# Patient Record
Sex: Male | Born: 1950
Health system: Southern US, Community
[De-identification: ages and names within clinical notes are randomized; demographics above are authoritative.]

## PROBLEM LIST (undated history)

## (undated) DIAGNOSIS — I639 Cerebral infarction, unspecified: Secondary | ICD-10-CM

## (undated) DIAGNOSIS — I2699 Other pulmonary embolism without acute cor pulmonale: Secondary | ICD-10-CM

## (undated) DIAGNOSIS — E7800 Pure hypercholesterolemia, unspecified: Secondary | ICD-10-CM

## (undated) DIAGNOSIS — N400 Enlarged prostate without lower urinary tract symptoms: Secondary | ICD-10-CM

## (undated) DIAGNOSIS — K56609 Unspecified intestinal obstruction, unspecified as to partial versus complete obstruction: Secondary | ICD-10-CM

## (undated) DIAGNOSIS — I5032 Chronic diastolic (congestive) heart failure: Secondary | ICD-10-CM

## (undated) HISTORY — PX: LIPOMA EXCISION: SHX5283

---

## 1982-08-08 DIAGNOSIS — I639 Cerebral infarction, unspecified: Secondary | ICD-10-CM

## 1982-08-08 HISTORY — DX: Cerebral infarction, unspecified: I63.9

## 1998-01-19 ENCOUNTER — Other Ambulatory Visit: Admission: RE | Admit: 1998-01-19 | Discharge: 1998-01-19 | Payer: Self-pay | Admitting: Family Medicine

## 2003-06-17 ENCOUNTER — Emergency Department (HOSPITAL_COMMUNITY): Admission: EM | Admit: 2003-06-17 | Discharge: 2003-06-17 | Payer: Self-pay | Admitting: Emergency Medicine

## 2003-07-02 ENCOUNTER — Inpatient Hospital Stay (HOSPITAL_COMMUNITY): Admission: EM | Admit: 2003-07-02 | Discharge: 2003-07-30 | Payer: Self-pay | Admitting: Emergency Medicine

## 2003-08-22 ENCOUNTER — Encounter: Admission: RE | Admit: 2003-08-22 | Discharge: 2003-08-22 | Payer: Self-pay | Admitting: Internal Medicine

## 2004-03-17 ENCOUNTER — Emergency Department (HOSPITAL_COMMUNITY): Admission: EM | Admit: 2004-03-17 | Discharge: 2004-03-17 | Payer: Self-pay | Admitting: Emergency Medicine

## 2004-03-19 ENCOUNTER — Emergency Department (HOSPITAL_COMMUNITY): Admission: EM | Admit: 2004-03-19 | Discharge: 2004-03-19 | Payer: Self-pay | Admitting: Family Medicine

## 2004-04-21 ENCOUNTER — Ambulatory Visit: Payer: Self-pay | Admitting: Family Medicine

## 2004-04-21 ENCOUNTER — Ambulatory Visit: Payer: Self-pay | Admitting: Internal Medicine

## 2005-03-29 ENCOUNTER — Ambulatory Visit: Payer: Self-pay | Admitting: Family Medicine

## 2005-11-04 ENCOUNTER — Ambulatory Visit: Payer: Self-pay | Admitting: Family Medicine

## 2005-11-08 ENCOUNTER — Ambulatory Visit: Payer: Self-pay | Admitting: Internal Medicine

## 2005-11-08 ENCOUNTER — Ambulatory Visit: Payer: Self-pay | Admitting: Family Medicine

## 2005-11-08 ENCOUNTER — Inpatient Hospital Stay (HOSPITAL_COMMUNITY): Admission: AD | Admit: 2005-11-08 | Discharge: 2005-11-15 | Payer: Self-pay | Admitting: Internal Medicine

## 2005-11-16 ENCOUNTER — Ambulatory Visit: Payer: Self-pay | Admitting: Family Medicine

## 2005-11-17 ENCOUNTER — Ambulatory Visit: Payer: Self-pay | Admitting: Family Medicine

## 2005-11-18 ENCOUNTER — Ambulatory Visit: Payer: Self-pay | Admitting: Family Medicine

## 2005-11-21 ENCOUNTER — Ambulatory Visit: Payer: Self-pay | Admitting: Family Medicine

## 2005-11-22 ENCOUNTER — Ambulatory Visit: Payer: Self-pay | Admitting: Family Medicine

## 2005-11-23 ENCOUNTER — Ambulatory Visit: Payer: Self-pay | Admitting: Family Medicine

## 2005-11-24 ENCOUNTER — Ambulatory Visit: Payer: Self-pay | Admitting: Family Medicine

## 2005-11-25 ENCOUNTER — Ambulatory Visit: Payer: Self-pay | Admitting: Family Medicine

## 2005-11-29 ENCOUNTER — Ambulatory Visit: Payer: Self-pay | Admitting: Family Medicine

## 2005-11-30 ENCOUNTER — Ambulatory Visit: Payer: Self-pay | Admitting: Family Medicine

## 2005-12-01 ENCOUNTER — Ambulatory Visit: Payer: Self-pay | Admitting: Family Medicine

## 2005-12-02 ENCOUNTER — Ambulatory Visit: Payer: Self-pay | Admitting: Family Medicine

## 2005-12-05 ENCOUNTER — Ambulatory Visit: Payer: Self-pay | Admitting: Family Medicine

## 2005-12-07 ENCOUNTER — Ambulatory Visit: Payer: Self-pay | Admitting: Family Medicine

## 2005-12-08 ENCOUNTER — Ambulatory Visit: Payer: Self-pay | Admitting: Family Medicine

## 2005-12-09 ENCOUNTER — Ambulatory Visit: Payer: Self-pay | Admitting: Family Medicine

## 2005-12-12 ENCOUNTER — Ambulatory Visit: Payer: Self-pay | Admitting: Family Medicine

## 2005-12-13 ENCOUNTER — Ambulatory Visit: Payer: Self-pay | Admitting: Family Medicine

## 2005-12-15 ENCOUNTER — Ambulatory Visit: Payer: Self-pay | Admitting: Family Medicine

## 2005-12-16 ENCOUNTER — Ambulatory Visit: Payer: Self-pay | Admitting: Family Medicine

## 2005-12-19 ENCOUNTER — Ambulatory Visit: Payer: Self-pay | Admitting: Family Medicine

## 2005-12-20 ENCOUNTER — Ambulatory Visit: Payer: Self-pay | Admitting: Family Medicine

## 2005-12-21 ENCOUNTER — Ambulatory Visit: Payer: Self-pay | Admitting: Family Medicine

## 2005-12-22 ENCOUNTER — Ambulatory Visit: Payer: Self-pay | Admitting: Family Medicine

## 2005-12-23 ENCOUNTER — Ambulatory Visit: Payer: Self-pay | Admitting: Family Medicine

## 2005-12-26 ENCOUNTER — Ambulatory Visit: Payer: Self-pay | Admitting: Family Medicine

## 2005-12-27 ENCOUNTER — Ambulatory Visit: Payer: Self-pay | Admitting: Family Medicine

## 2005-12-29 ENCOUNTER — Ambulatory Visit: Payer: Self-pay | Admitting: Family Medicine

## 2005-12-30 ENCOUNTER — Ambulatory Visit: Payer: Self-pay | Admitting: Family Medicine

## 2006-01-03 ENCOUNTER — Ambulatory Visit: Payer: Self-pay | Admitting: Family Medicine

## 2006-01-05 ENCOUNTER — Ambulatory Visit: Payer: Self-pay | Admitting: Family Medicine

## 2006-01-06 ENCOUNTER — Ambulatory Visit: Payer: Self-pay | Admitting: Family Medicine

## 2006-01-09 ENCOUNTER — Ambulatory Visit: Payer: Self-pay | Admitting: Family Medicine

## 2006-01-10 ENCOUNTER — Ambulatory Visit: Payer: Self-pay | Admitting: Family Medicine

## 2006-01-11 ENCOUNTER — Ambulatory Visit: Payer: Self-pay | Admitting: Family Medicine

## 2006-01-13 ENCOUNTER — Ambulatory Visit: Payer: Self-pay | Admitting: Family Medicine

## 2006-01-16 ENCOUNTER — Ambulatory Visit: Payer: Self-pay | Admitting: Family Medicine

## 2006-01-17 ENCOUNTER — Ambulatory Visit: Payer: Self-pay | Admitting: Family Medicine

## 2006-01-18 ENCOUNTER — Ambulatory Visit: Payer: Self-pay | Admitting: Family Medicine

## 2006-02-02 ENCOUNTER — Ambulatory Visit: Payer: Self-pay | Admitting: Family Medicine

## 2006-10-31 ENCOUNTER — Ambulatory Visit: Payer: Self-pay | Admitting: Family Medicine

## 2006-12-04 ENCOUNTER — Ambulatory Visit: Payer: Self-pay | Admitting: Family Medicine

## 2007-05-28 ENCOUNTER — Telehealth (INDEPENDENT_AMBULATORY_CARE_PROVIDER_SITE_OTHER): Payer: Self-pay | Admitting: *Deleted

## 2007-06-07 ENCOUNTER — Encounter (INDEPENDENT_AMBULATORY_CARE_PROVIDER_SITE_OTHER): Payer: Self-pay | Admitting: Family Medicine

## 2007-06-08 DIAGNOSIS — K649 Unspecified hemorrhoids: Secondary | ICD-10-CM | POA: Insufficient documentation

## 2007-06-08 DIAGNOSIS — E785 Hyperlipidemia, unspecified: Secondary | ICD-10-CM | POA: Insufficient documentation

## 2016-11-13 ENCOUNTER — Encounter (HOSPITAL_COMMUNITY): Payer: Self-pay | Admitting: Emergency Medicine

## 2016-11-13 ENCOUNTER — Emergency Department (HOSPITAL_COMMUNITY): Payer: Medicare HMO

## 2016-11-13 ENCOUNTER — Inpatient Hospital Stay (HOSPITAL_COMMUNITY): Payer: Medicare HMO

## 2016-11-13 ENCOUNTER — Inpatient Hospital Stay (HOSPITAL_COMMUNITY)
Admission: EM | Admit: 2016-11-13 | Discharge: 2016-11-18 | DRG: 176 | Disposition: A | Discharge status: Home / Self Care | Payer: Medicare HMO | Attending: Internal Medicine | Admitting: Internal Medicine

## 2016-11-13 DIAGNOSIS — I2699 Other pulmonary embolism without acute cor pulmonale: Secondary | ICD-10-CM

## 2016-11-13 DIAGNOSIS — E785 Hyperlipidemia, unspecified: Secondary | ICD-10-CM | POA: Diagnosis present

## 2016-11-13 DIAGNOSIS — I82431 Acute embolism and thrombosis of right popliteal vein: Secondary | ICD-10-CM | POA: Diagnosis present

## 2016-11-13 DIAGNOSIS — I69354 Hemiplegia and hemiparesis following cerebral infarction affecting left non-dominant side: Secondary | ICD-10-CM | POA: Diagnosis not present

## 2016-11-13 DIAGNOSIS — E7800 Pure hypercholesterolemia, unspecified: Secondary | ICD-10-CM | POA: Diagnosis present

## 2016-11-13 DIAGNOSIS — N400 Enlarged prostate without lower urinary tract symptoms: Secondary | ICD-10-CM | POA: Diagnosis present

## 2016-11-13 DIAGNOSIS — Z87891 Personal history of nicotine dependence: Secondary | ICD-10-CM | POA: Diagnosis not present

## 2016-11-13 DIAGNOSIS — I519 Heart disease, unspecified: Secondary | ICD-10-CM | POA: Diagnosis present

## 2016-11-13 DIAGNOSIS — E041 Nontoxic single thyroid nodule: Secondary | ICD-10-CM | POA: Diagnosis not present

## 2016-11-13 HISTORY — DX: Benign prostatic hyperplasia without lower urinary tract symptoms: N40.0

## 2016-11-13 HISTORY — DX: Pure hypercholesterolemia, unspecified: E78.00

## 2016-11-13 HISTORY — DX: Cerebral infarction, unspecified: I63.9

## 2016-11-13 LAB — CBC WITH DIFFERENTIAL/PLATELET
Basophils Absolute: 0 10*3/uL (ref 0.0–0.1)
Basophils Relative: 0 %
Eosinophils Absolute: 0.3 10*3/uL (ref 0.0–0.7)
Eosinophils Relative: 2 %
HCT: 49.7 % (ref 39.0–52.0)
Hemoglobin: 16.9 g/dL (ref 13.0–17.0)
Lymphocytes Relative: 16 %
Lymphs Abs: 1.8 10*3/uL (ref 0.7–4.0)
MCH: 30.2 pg (ref 26.0–34.0)
MCHC: 34 g/dL (ref 30.0–36.0)
MCV: 88.8 fL (ref 78.0–100.0)
Monocytes Absolute: 1 10*3/uL (ref 0.1–1.0)
Monocytes Relative: 9 %
Neutro Abs: 8.4 10*3/uL — ABNORMAL HIGH (ref 1.7–7.7)
Neutrophils Relative %: 73 %
Platelets: 188 10*3/uL (ref 150–400)
RBC: 5.6 MIL/uL (ref 4.22–5.81)
RDW: 13.5 % (ref 11.5–15.5)
WBC: 11.4 10*3/uL — ABNORMAL HIGH (ref 4.0–10.5)

## 2016-11-13 LAB — I-STAT CHEM 8, ED
BUN: 28 mg/dL — ABNORMAL HIGH (ref 6–20)
Calcium, Ion: 1.18 mmol/L (ref 1.15–1.40)
Chloride: 110 mmol/L (ref 101–111)
Creatinine, Ser: 0.9 mg/dL (ref 0.61–1.24)
Glucose, Bld: 125 mg/dL — ABNORMAL HIGH (ref 65–99)
HCT: 50 % (ref 39.0–52.0)
Hemoglobin: 17 g/dL (ref 13.0–17.0)
Potassium: 3.9 mmol/L (ref 3.5–5.1)
Sodium: 141 mmol/L (ref 135–145)
TCO2: 25 mmol/L (ref 0–100)

## 2016-11-13 LAB — BASIC METABOLIC PANEL
Anion gap: 11 (ref 5–15)
BUN: 24 mg/dL — AB (ref 6–20)
CHLORIDE: 106 mmol/L (ref 101–111)
CO2: 23 mmol/L (ref 22–32)
CREATININE: 0.91 mg/dL (ref 0.61–1.24)
Calcium: 9.4 mg/dL (ref 8.9–10.3)
GFR calc Af Amer: >60 mL/min (ref >60)
GFR calc non Af Amer: >60 mL/min (ref >60)
Glucose, Bld: 119 mg/dL — ABNORMAL HIGH (ref 65–99)
Potassium: 4.2 mmol/L (ref 3.5–5.1)
SODIUM: 140 mmol/L (ref 135–145)

## 2016-11-13 LAB — TROPONIN I
TROPONIN I: 0.44 ng/mL — AB (ref <0.03)
Troponin I: 0.74 ng/mL (ref <0.03)
Troponin I: 0.5 ng/mL (ref <0.03)
Troponin I: 0.44 ng/mL (ref <0.03)

## 2016-11-13 LAB — I-STAT TROPONIN, ED: Troponin i, poc: 1.07 ng/mL (ref 0.00–0.08)

## 2016-11-13 LAB — RAPID URINE DRUG SCREEN, HOSP PERFORMED
Amphetamines: NOT DETECTED
BENZODIAZEPINES: NOT DETECTED
Barbiturates: NOT DETECTED
COCAINE: NOT DETECTED
OPIATES: NOT DETECTED
Tetrahydrocannabinol: POSITIVE — AB

## 2016-11-13 LAB — HEPARIN LEVEL (UNFRACTIONATED)
Heparin Unfractionated: 1.1 IU/mL — ABNORMAL HIGH (ref 0.30–0.70)
Heparin Unfractionated: 0.45 IU/mL (ref 0.30–0.70)

## 2016-11-13 LAB — T4, FREE: Free T4: 0.97 ng/dL (ref 0.61–1.12)

## 2016-11-13 LAB — BRAIN NATRIURETIC PEPTIDE: B Natriuretic Peptide: 42.7 pg/mL (ref 0.0–100.0)

## 2016-11-13 LAB — MRSA PCR SCREENING: MRSA BY PCR: NEGATIVE

## 2016-11-13 LAB — TSH: TSH: 0.954 u[IU]/mL (ref 0.350–4.500)

## 2016-11-13 MED ORDER — ACETAMINOPHEN 325 MG PO TABS: 650.0000 mg | ORAL_TABLET | Freq: Four times a day (QID) | ORAL | Status: DC | PRN

## 2016-11-13 MED ORDER — TAMSULOSIN HCL 0.4 MG PO CAPS
0.4000 mg | ORAL_CAPSULE | Freq: Every day | ORAL | Status: DC
  Administered 2016-11-13 – 2016-11-18 (×6): 0.4 mg via ORAL
  Filled 2016-11-13 (×6): qty 1

## 2016-11-13 MED ORDER — HEPARIN (PORCINE) IN NACL 100-0.45 UNIT/ML-% IJ SOLN
1500.0000 [IU]/h | INTRAMUSCULAR | Status: DC
  Administered 2016-11-13: 1500 [IU]/h via INTRAVENOUS
  Filled 2016-11-13: qty 250

## 2016-11-13 MED ORDER — IOPAMIDOL (ISOVUE-370) INJECTION 76%
INTRAVENOUS | Status: AC
  Administered 2016-11-13: 100 mL
  Filled 2016-11-13: qty 100

## 2016-11-13 MED ORDER — ZOLPIDEM TARTRATE 5 MG PO TABS: 5.0000 mg | ORAL_TABLET | Freq: Every evening | ORAL | Status: DC | PRN

## 2016-11-13 MED ORDER — ACETAMINOPHEN 650 MG RE SUPP: 650.0000 mg | Freq: Four times a day (QID) | RECTAL | Status: DC | PRN

## 2016-11-13 MED ORDER — HEPARIN (PORCINE) IN NACL 100-0.45 UNIT/ML-% IJ SOLN
1400.0000 [IU]/h | INTRAMUSCULAR | Status: DC
  Administered 2016-11-13: 1250 [IU]/h via INTRAVENOUS
  Administered 2016-11-14 – 2016-11-17 (×3): 1350 [IU]/h via INTRAVENOUS
  Filled 2016-11-13 (×6): qty 250

## 2016-11-13 MED ORDER — SODIUM CHLORIDE 0.9% FLUSH
3.0000 mL | Freq: Two times a day (BID) | INTRAVENOUS | Status: DC
  Administered 2016-11-13 – 2016-11-18 (×6): 3 mL via INTRAVENOUS

## 2016-11-13 MED ORDER — IPRATROPIUM BROMIDE 0.02 % IN SOLN
0.5000 mg | Freq: Once | RESPIRATORY_TRACT | Status: AC
  Administered 2016-11-13: 0.5 mg via RESPIRATORY_TRACT
  Filled 2016-11-13: qty 2.5

## 2016-11-13 MED ORDER — ALBUTEROL SULFATE (2.5 MG/3ML) 0.083% IN NEBU
5.0000 mg | INHALATION_SOLUTION | Freq: Once | RESPIRATORY_TRACT | Status: AC
  Administered 2016-11-13: 5 mg via RESPIRATORY_TRACT
  Filled 2016-11-13: qty 6

## 2016-11-13 MED ORDER — ONDANSETRON HCL 4 MG/2ML IJ SOLN: 4.0000 mg | Freq: Three times a day (TID) | INTRAMUSCULAR | Status: DC | PRN

## 2016-11-13 MED ORDER — SENNOSIDES-DOCUSATE SODIUM 8.6-50 MG PO TABS
1.0000 | ORAL_TABLET | Freq: Every evening | ORAL | Status: DC | PRN
  Filled 2016-11-13: qty 1

## 2016-11-13 MED ORDER — OXYCODONE-ACETAMINOPHEN 5-325 MG PO TABS: 1.0000 | ORAL_TABLET | ORAL | Status: DC | PRN

## 2016-11-13 MED ORDER — ALBUTEROL SULFATE (2.5 MG/3ML) 0.083% IN NEBU: 5.0000 mg | INHALATION_SOLUTION | RESPIRATORY_TRACT | Status: DC | PRN

## 2016-11-13 MED ORDER — HEPARIN BOLUS VIA INFUSION
6000.0000 [IU] | Freq: Once | INTRAVENOUS | Status: AC
  Administered 2016-11-13: 6000 [IU] via INTRAVENOUS
  Filled 2016-11-13: qty 6000

## 2016-11-13 MED ORDER — ROSUVASTATIN CALCIUM 10 MG PO TABS
10.0000 mg | ORAL_TABLET | Freq: Every day | ORAL | Status: DC
  Administered 2016-11-17: 10 mg via ORAL
  Filled 2016-11-13 (×2): qty 1

## 2016-11-13 MED ORDER — SODIUM CHLORIDE 0.9 % IV SOLN
INTRAVENOUS | Status: DC
  Administered 2016-11-13 (×2): via INTRAVENOUS
  Administered 2016-11-14: 75 mL/h via INTRAVENOUS

## 2016-11-13 NOTE — Progress Notes (Signed)
Patient seen and examined by me. Patient was admitted by Dr.Niu for acute shortness of breath and was found to have PE with right heart strain. He was started on heparin drip and echocardiogram/lower extremity Dopplers ordered. Pulmonary was consulted as well. At this time patient is doing well and states his breathing has improved since his admission and does not agree new complaints at this time.  Physical exam remains unchanged from the time of his admission  Pulmonary embolism with right-sided heart strain Solid/cystic right thyroid nodule Hyperlipidemia BPH  At this time continue his heparin drip Echocardiogram and lower summary Dopplers of in order Pain control Nebulizer treatments as needed Follow-up thyroid ultrasound Continue Crestor and Flomax Pulmonary following  I have discussed with them Coumadin versus noval anticoagulation agents and he would like to speak with case management once the decision is made in terms of cost and coverage.

## 2016-11-13 NOTE — Consult Note (Signed)
PULMONARY / CRITICAL CARE MEDICINE   Name: Jason Myers MRN: [id number] DOB: 06-15-1951    ADMISSION DATE:  11/13/2016 CONSULTATION DATE:  .11/13/16  REFERRING MD:  Dr. Nicanor Alcon  CHIEF COMPLAINT:  Pulmonary embolism  HISTORY OF PRESENT ILLNESS:   Jason Myers is a 66 y.o. 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 He was in his usual state of health until this evening when he woke up abruptly with an unsettling feeling with his breathing from sleep. He tried to ignore the symptoms for about an hour before calling EMS to bring him in. He at baseline does not walk much due to his neurologic deficits. Here in the ED, he was found to have a saddle pulmonary embolism with evidence of right heart strain by RV:LV 1.2 and EKG pattern. In addition his troponin is 1. PCCM was consulted as part of a CODE PE.   Aside from his CVA, he only has the diagnoses of high cholesterol and BPH. He had a lipoma extracted years ago but no other surgeries or recent trauma. He has not noticed any symptoms of chest pain, shortness of breath, presyncope or syncope or changes in his legs.   PAST MEDICAL HISTORY :  He  has a past medical history of BPH (benign prostatic hyperplasia); CVA (cerebral vascular accident) (HCC) (1984); and Hypercholesterolemia.  PAST SURGICAL HISTORY: He  has a past surgical history that includes Lipoma excision (Left).  No Known Allergies  No current facility-administered medications on file prior to encounter.    No current outpatient prescriptions on file prior to encounter.    FAMILY HISTORY:  His indicated that the status of his mother is unknown. He indicated that the status of his father is unknown.    SOCIAL HISTORY: He  reports that he has quit smoking. He has never used smokeless tobacco. He reports that he drinks alcohol. He reports that he uses drugs, including  Marijuana.  REVIEW OF SYSTEMS:   Complete ROS otherwise negative.  VITAL SIGNS: BP (!) 113/96   Pulse (!) 109   Resp 17   Ht  (1.854 m)   Wt 88.5 kg (195 lb)   SpO2 91%   BMI 25.73 kg/m   INTAKE / OUTPUT: No intake/output data recorded.  PHYSICAL EXAMINATION: General:  Alert, oriented, no acute distress Neuro:  Left extremities in contractures, able to bend left leg at the knee but otherwise weak, no facial asymetry HEENT:  MMM, clear oropharynx Cardiovascular:  Tachycardic, no murmurs rubs or gallops Lungs:  CTAB Abdomen:  Soft NT ND no rebound or gaurding Musculoskeletal:  Left hand and food in flexed contracture Skin:  No rashes  LABS:  BMET  Recent Labs Lab 11/13/16 0214  NA 141  K 3.9  CL 110  BUN 28*  CREATININE 0.90  GLUCOSE 125*    Electrolytes No results for input(s): CALCIUM, MG, PHOS in the last 168 hours.  CBC  Recent Labs Lab 11/13/16 0153 11/13/16 0214  WBC 11.4*  --   HGB 16.9 17.0  HCT 49.7 50.0  PLT 188  --     Coag's No results for input(s): APTT, INR in the last 168 hours.  Sepsis Markers No results for input(s): LATICACIDVEN, PROCALCITON, O2SATVEN in the last 168 hours.  ABG No results for input(s): PHART, PCO2ART, PO2ART in the last 168 hours.  Liver Enzymes No results for input(s):  AST, ALT, ALKPHOS, BILITOT, ALBUMIN in the last 168 hours.  Cardiac Enzymes No results for input(s): TROPONINI, PROBNP in the last 168 hours.  Glucose No results for input(s): GLUCAP in the last 168 hours.  Imaging Dg Chest 2 View  Result Date: 11/13/2016 CLINICAL DATA:  Dyspnea for couple hours tonight. EXAM: CHEST  2 VIEW COMPARISON:  None. FINDINGS: The lungs are clear. The pulmonary vasculature is normal. Heart size is normal. Hilar and mediastinal contours are unremarkable. There is no pleural effusion. IMPRESSION: No active cardiopulmonary disease. Electronically Signed   By: Ellery Plunk M.D.   On: 11/13/2016 02:03   Ct  Angio Chest Pe W And/or Wo Contrast  Result Date: 11/13/2016 CLINICAL DATA:  Dyspnea starting this evening without chest pain EXAM: CT ANGIOGRAPHY CHEST WITH CONTRAST TECHNIQUE: Multidetector CT imaging of the chest was performed using the standard protocol during bolus administration of intravenous contrast. Multiplanar CT image reconstructions and MIPs were obtained to evaluate the vascular anatomy. CONTRAST:  100 cc Isovue-300 IV COMPARISON:  None. FINDINGS: Cardiovascular: Acute multilobar pulmonary emboli within the distal right and left pulmonary arteries extending into the lobar, segmental and subsegmental branches bilaterally. No pulmonary infarction. No effusion or pneumothorax. RV/LV ratio is 1.2 consistent with right heart strain. No aortic aneurysm or dissection. Coronary arteriosclerosis is noted. No pericardial effusion. Heart is top-normal in size. Mediastinum/Nodes: No enlarged mediastinal, hilar, or axillary lymph nodes. The trachea and esophagus demonstrate no significant findings. There is a dominant, partially solid and partially cystic right thyroid lobe nodule measuring 3.1 x 3.5 cm for which ultrasound is recommended. Lungs/Pleura: Bibasilar dependent atelectasis. No pulmonary infarcts. No pneumonic consolidation, effusion or pneumothorax. Upper Abdomen: No acute abnormality. Musculoskeletal: No chest wall abnormality. No acute or significant osseous findings. Review of the MIP images confirms the above findings. IMPRESSION: 1. Positive for acute PE with CT evidence of right heart strain (RV/LV Ratio = 1.2) consistent with at least submassive (intermediate risk) PE. The presence of right heart strain has been associated with an increased risk of morbidity and mortality. Please activate Code PE by paging [phone]. Critical Value/emergent results were called by telephone at the time of interpretation on 11/13/2016 at 3:37 am to Dr. Cy Blamer , who verbally acknowledged these results. 2.  3.1 x 3.5 cm partially solid, partially cystic right thyroid nodule. Ultrasound is recommended. This follows ACR consensus guidelines: Managing Incidental Thyroid Nodules Detected on Imaging: White Paper of the ACR Incidental Thyroid Findings Committee. J Am Coll Radiol 2015; 12:143-150. Electronically Signed   By: Tollie Eth M.D.   On: 11/13/2016 03:38     DISCUSSION: Jason Myers is a 66 y.o. 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 Due to his previous history of aneurysmal rupture, he is not a candidate for systemic thrombolysis nor catheter directed tPA. Catheter embolectomy is a consideration but no clear guidelines exist to guide it as standard of care in this setting. If he were to become hypotensive, more invasive measures may be needed; and the patient is aware of the risks and his tenuous status.  ASSESSMENT / PLAN: Recommend the following - Echocardiogram now - Start therapeutic heparin infusion - Cycle troponins q6h - Lower extremity DVT doppler studies -  Consider temporary IVC filter if DVT found in legs - Admit to Stepdown unit with telemetry   Cornell Barman MD Pulmonary and Critical Care Medicine [facility] Pager: [phone]  11/13/2016, 4:48 AM

## 2016-11-13 NOTE — ED Notes (Signed)
Patient transported to CT 

## 2016-11-13 NOTE — Progress Notes (Signed)
VASCULAR LAB PRELIMINARY  PRELIMINARY  PRELIMINARY  PRELIMINARY  Bilateral lower extremity venous duplex completed.    Preliminary report:  There is subacute DVT noted in the right popliteal vein.    Called results to Campbell, RN  [facility], Estiben Mizuno, RVT 11/13/2016, 11:57 AM

## 2016-11-13 NOTE — ED Notes (Signed)
Dr. Clyde Lundborg notified of critical troponin of 0.74.

## 2016-11-13 NOTE — ED Notes (Signed)
Attempted report 

## 2016-11-13 NOTE — Progress Notes (Signed)
ANTICOAGULATION CONSULT NOTE  Pharmacy Consult for heparin Indication: pulmonary embolus  No Known Allergies  Patient Measurements: Height:  (185.4 cm) Weight: 195 lb (88.5 kg) IBW/kg (Calculated) : 79.9  Vital Signs: Temp: 98.1 F (36.7 C) (04/08 2100) Temp Source: Oral (04/08 2100)  Labs:  Recent Labs  11/13/16 0153 11/13/16 0214 11/13/16 0437 11/13/16 0857 11/13/16 0902 11/13/16 1319 11/13/16 1950  HGB 16.9 17.0  --   --   --   --   --   HCT 49.7 50.0  --   --   --   --   --   PLT 188  --   --   --   --   --   --   HEPARINUNFRC  --   --   --   --  1.10*  --  0.45  CREATININE  --  0.90 0.91  --   --   --   --   TROPONINI  --   --  0.74* 0.50*  --  0.44*  --     Estimated Creatinine Clearance: 89 mL/min (by C-G formula based on SCr of 0.91 mg/dL).  Assessment: 66yo male c/o SOB onset earlier in evening, when pt woke up from chair SOB had worsened, CT shows PE w/ evidence of RHS, RV/LV ratio 1.2. Not candidate for thrombolysis 2/2 stroke history. First level elevated at 1.1, now dropped to 0.45 units/mL.  No bleeding noted.  Goal of Therapy:  Heparin level 0.3-0.7 units/ml Monitor platelets by anticoagulation protocol: Yes   Plan:  Increase heparin 1350 units/hr as concerned with significant drop in heparin level Recheck level with daily labs Daily heparin level and CBC Monitor for s/s bleeding.   Evaan Tidwell D. Doyle Tegethoff, PharmD, BCPS Clinical Pharmacist Pager: [phone] 11/13/2016 10:09 PM

## 2016-11-13 NOTE — ED Notes (Signed)
O2 sensor placed on ear.  Sat noted to be 94% on room air.  Getting breathing treatment at this time.

## 2016-11-13 NOTE — Plan of Care (Signed)
Problem: Health Behavior/Discharge Planning: Goal: Ability to manage health-related needs will improve Outcome: Progressing Case Management and SW consults requested.

## 2016-11-13 NOTE — Progress Notes (Signed)
ANTICOAGULATION CONSULT NOTE - Initial Consult  Pharmacy Consult for heparin Indication: pulmonary embolus  No Known Allergies  Patient Measurements: Height:  (185.4 cm) Weight: 195 lb (88.5 kg) IBW/kg (Calculated) : 79.9  Vital Signs: BP: 122/91 (04/08 0745) Pulse Rate: 103 (04/08 0745)  Labs:  Recent Labs  11/13/16 0153 11/13/16 0214 11/13/16 0437 11/13/16 0857 11/13/16 0902  HGB 16.9 17.0  --   --   --   HCT 49.7 50.0  --   --   --   PLT 188  --   --   --   --   HEPARINUNFRC  --   --   --   --  1.10*  CREATININE  --  0.90 0.91  --   --   TROPONINI  --   --  0.74* 0.50*  --     Estimated Creatinine Clearance: 89 mL/min (by C-G formula based on SCr of 0.91 mg/dL).   Medical History: Past Medical History:  Diagnosis Date  . BPH (benign prostatic hyperplasia)   . CVA (cerebral vascular accident) (HCC) 1984   left side affected.    . Hypercholesterolemia     Assessment: 66yo male c/o SOB onset earlier in evening, when pt woke up from chair SOB had worsened, CT shows PE w/ evidence of RHS, RV/LV ratio 1.2. Not candidate for thrombolysis 2/2 stroke history. Heparin level supratherapeutic at 1.1. Per RN labs drawn in opposite arm that heparin was infusing. CBC stable, no overt s/s bleeding.   Goal of Therapy:  Heparin level 0.3-0.7 units/ml Monitor platelets by anticoagulation protocol: Yes   Plan:  Hold heparin gtt x1 hr Resume heparin gtt at reduced rate: 1250 units/hr Heparin level in 6 hours Daily heparin level and CBC Monitor for s/s bleeding.   York Cerise, PharmD Pharmacy Resident  Pager [phone] 11/13/16 11:27 AM

## 2016-11-13 NOTE — ED Triage Notes (Signed)
Brought by ems from home.  Reports having some SOB earlier in night but fell asleep in chair.  When he woke up to go to bed had worse SOB.  No history of.  Breathing easy and non-labored.  Sats in low 80's on RA.  Placed on 3L [place] sat up to 88-90%.

## 2016-11-13 NOTE — H&P (Signed)
History and Physical    Jason Myers ZOX:[id number] DOB: 1951-05-04 DOA: 11/13/2016  Referring MD/NP/PA:   PCP: [facility]   Patient coming from:  The patient is coming from home.  At baseline, pt is independent for most of ADL.   Chief Complaint: Shortness of breath  HPI: Jason Myers is a 66 y.o. male with medical history significant of hemorrhagic stroke, hyperlipidemia, BPH, who presents with shortness breath.  Patient states that he started having shortness of breath since last night, but denies chest pain, cough, fever or chills. No recent long distant traveling history. No tenderness in calf areas. Patient was found to have O2 desaturating to 80% on room air, which improved to 88-90% on 3 L oxygen. Patient denies nausea, vomiting, diarrhea, abdominal pain, symptoms of UTI or unilateral weakness. Patient states that he drinks alcohol approximately 3 times per month. Last drinking was  on Monday.  ED Course: pt was found to have WBC 11.4, BNP 42.7, electrolytes renal function okay, negative chest x-ray, tachycardia. CT angiogram of chest showed submassive PE with evidence of right heart straining, also showed a 3.1 x 3.5 cm partially solid, partially cystic right thyroid nodule.  Pt is admitted to [facility] as inpt. PCCM was consulted (EDP does not remember doctor's name).   Review of Systems:   General: no fevers, chills, no changes in body weight, has fatigue HEENT: no blurry vision, hearing changes or sore throat Respiratory: has dyspnea, no coughing, wheezing CV: no chest pain, no palpitations GI: no nausea, vomiting, abdominal pain, diarrhea, constipation GU: no dysuria, burning on urination, increased urinary frequency, hematuria  Ext: no leg edema Neuro: no unilateral weakness, numbness, or tingling, no vision change or hearing loss Skin: no rash, no skin tear. MSK: No muscle spasm, no deformity, no limitation of range of movement in spin Heme: No easy bruising.  Travel  history: No recent long distant travel.  Allergy: No Known Allergies  Past Medical History:  Diagnosis Date  . BPH (benign prostatic hyperplasia)   . CVA (cerebral vascular accident) (HCC) 1984   left side affected.    . Hypercholesterolemia     Past Surgical History:  Procedure Laterality Date  . LIPOMA EXCISION Left    groin    Social History:  reports that he has quit smoking. He has never used smokeless tobacco. He reports that he drinks alcohol. He reports that he uses drugs, including Marijuana.  Family History:  Family History  Problem Relation Age of Onset  . Alcoholism Mother   . Kidney Stones Father      Prior to Admission medications   Medication Sig Start Date End Date Taking? Authorizing Provider  rosuvastatin (CRESTOR) 10 MG tablet Take 10 mg by mouth daily. 09/28/16  Yes Historical Provider, MD  tamsulosin (FLOMAX) 0.4 MG CAPS capsule Take 0.4 mg by mouth daily. 09/28/16  Yes Historical Provider, MD    Physical Exam: Vitals:   11/13/16 0245 11/13/16 0330 11/13/16 0336 11/13/16 0345  BP: 127/90 109/90  (!) 113/96  Pulse: (!) 115  (!) 106 (!) 109  Resp: 13  (!) 22 17  SpO2: 94%  94% 91%  Weight:      Height:       General: Not in acute distress HEENT:       Eyes: PERRL, EOMI, no scleral icterus.       ENT: No discharge from the ears and nose, no pharynx injection, no tonsillar enlargement.  Neck: No JVD, no bruit, no mass felt. Heme: No neck lymph node enlargement. Cardiac: S1/S2, RRR,Tachycardia, No murmurs, No gallops or rubs. Respiratory: No rales, wheezing, rhonchi or rubs. GI: Soft, nondistended, nontender, no rebound pain, no organomegaly, BS present. GU: No hematuria Ext: No pitting leg edema bilaterally. 2+DP/PT pulse bilaterally. Musculoskeletal: No joint deformities, No joint redness or warmth, no limitation of ROM in spin. Skin: No rashes.  Neuro: Alert, oriented X3, cranial nerves II-XII grossly intact, moves all extremities  normally. Psych: Patient is not psychotic, no suicidal or hemocidal ideation.  Labs on Admission: I have personally reviewed following labs and imaging studies  CBC:  Recent Labs Lab 11/13/16 0153 11/13/16 0214  WBC 11.4*  --   NEUTROABS 8.4*  --   HGB 16.9 17.0  HCT 49.7 50.0  MCV 88.8  --   PLT 188  --    Basic Metabolic Panel:  Recent Labs Lab 11/13/16 0214  NA 141  K 3.9  CL 110  GLUCOSE 125*  BUN 28*  CREATININE 0.90   GFR: Estimated Creatinine Clearance: 90 mL/min (by C-G formula based on SCr of 0.9 mg/dL). Liver Function Tests: No results for input(s): AST, ALT, ALKPHOS, BILITOT, PROT, ALBUMIN in the last 168 hours. No results for input(s): LIPASE, AMYLASE in the last 168 hours. No results for input(s): AMMONIA in the last 168 hours. Coagulation Profile: No results for input(s): INR, PROTIME in the last 168 hours. Cardiac Enzymes: No results for input(s): CKTOTAL, CKMB, CKMBINDEX, TROPONINI in the last 168 hours. BNP (last 3 results) No results for input(s): PROBNP in the last 8760 hours. HbA1C: No results for input(s): HGBA1C in the last 72 hours. CBG: No results for input(s): GLUCAP in the last 168 hours. Lipid Profile: No results for input(s): CHOL, HDL, LDLCALC, TRIG, CHOLHDL, LDLDIRECT in the last 72 hours. Thyroid Function Tests: No results for input(s): TSH, T4TOTAL, FREET4, T3FREE, THYROIDAB in the last 72 hours. Anemia Panel: No results for input(s): VITAMINB12, FOLATE, FERRITIN, TIBC, IRON, RETICCTPCT in the last 72 hours. Urine analysis: No results found for: COLORURINE, APPEARANCEUR, LABSPEC, PHURINE, GLUCOSEU, HGBUR, BILIRUBINUR, KETONESUR, PROTEINUR, UROBILINOGEN, NITRITE, LEUKOCYTESUR Sepsis Labs: (procalcitonin:4,lacticidven:4) )No results found for this or any previous visit (from the past 240 hour(s)).   Radiological Exams on Admission: Dg Chest 2 View  Result Date: 11/13/2016 CLINICAL DATA:  Dyspnea for couple hours  tonight. EXAM: CHEST  2 VIEW COMPARISON:  None. FINDINGS: The lungs are clear. The pulmonary vasculature is normal. Heart size is normal. Hilar and mediastinal contours are unremarkable. There is no pleural effusion. IMPRESSION: No active cardiopulmonary disease. Electronically Signed   By: Ellery Plunk M.D.   On: 11/13/2016 02:03   Ct Angio Chest Pe W And/or Wo Contrast  Result Date: 11/13/2016 CLINICAL DATA:  Dyspnea starting this evening without chest pain EXAM: CT ANGIOGRAPHY CHEST WITH CONTRAST TECHNIQUE: Multidetector CT imaging of the chest was performed using the standard protocol during bolus administration of intravenous contrast. Multiplanar CT image reconstructions and MIPs were obtained to evaluate the vascular anatomy. CONTRAST:  100 cc Isovue-300 IV COMPARISON:  None. FINDINGS: Cardiovascular: Acute multilobar pulmonary emboli within the distal right and left pulmonary arteries extending into the lobar, segmental and subsegmental branches bilaterally. No pulmonary infarction. No effusion or pneumothorax. RV/LV ratio is 1.2 consistent with right heart strain. No aortic aneurysm or dissection. Coronary arteriosclerosis is noted. No pericardial effusion. Heart is top-normal in size. Mediastinum/Nodes: No enlarged mediastinal, hilar, or axillary lymph nodes. The trachea and  esophagus demonstrate no significant findings. There is a dominant, partially solid and partially cystic right thyroid lobe nodule measuring 3.1 x 3.5 cm for which ultrasound is recommended. Lungs/Pleura: Bibasilar dependent atelectasis. No pulmonary infarcts. No pneumonic consolidation, effusion or pneumothorax. Upper Abdomen: No acute abnormality. Musculoskeletal: No chest wall abnormality. No acute or significant osseous findings. Review of the MIP images confirms the above findings. IMPRESSION: 1. Positive for acute PE with CT evidence of right heart strain (RV/LV Ratio = 1.2) consistent with at least submassive  (intermediate risk) PE. The presence of right heart strain has been associated with an increased risk of morbidity and mortality. Please activate Code PE by paging [phone]. Critical Value/emergent results were called by telephone at the time of interpretation on 11/13/2016 at 3:37 am to Dr. Cy Blamer , who verbally acknowledged these results. 2. 3.1 x 3.5 cm partially solid, partially cystic right thyroid nodule. Ultrasound is recommended. This follows ACR consensus guidelines: Managing Incidental Thyroid Nodules Detected on Imaging: White Paper of the ACR Incidental Thyroid Findings Committee. J Am Coll Radiol 2015; 12:143-150. Electronically Signed   By: Tollie Eth M.D.   On: 11/13/2016 03:38     EKG: Independently reviewed. Sinus rhythm, tachycardia, QTC 445, early R-wave progression   Assessment/Plan Principal Problem:   PE (pulmonary thromboembolism) (HCC) Active Problems:   HLD (hyperlipidemia)   BPH (benign prostatic hyperplasia)   Thyroid nodule   PE (pulmonary thromboembolism) (HCC): CT angiogram of chest showed submassive PE with evidence of right heart straining. Currently hemodynamically stable. BNP 42.7. Patient does not have chest pain. No triggering factors identified. Patient has no recent long distant traveling or surgery. PCCM was consulted (EDP does not remember doctor's name).  -will admit to SDU as inpt -heparin drip initiated -2D echocardiogram ordered -LE dopplers ordered to evaluate for DVT -When necessary Percocet if develops chest pain -prn albuterol nebs -trop x 3  Thyroid nodule: Incidental findings. 3.1 x 3.5 cm partially solid, partially cystic right thyroid nodule.   -get US-thyroid -check TSH, Free T4 and T3  HLD (hyperlipidemia): -crestor  BPH (benign prostatic hyperplasia): -continue flomax.   DVT ppx: IV Heparin   Code Status: Full code Family Communication: None at bed side.  Disposition Plan:  Anticipate discharge back to previous  home environment Consults called: PCCM was consulted (EDP does not remember doctor's name). Admission status:  SDU/inpation       Date of Service 11/13/2016    Lorretta Harp [facility] Pager [phone]  If 7PM-7AM, please contact night-coverage [email or web address] Password [facility] 11/13/2016, 4:48 AM

## 2016-11-13 NOTE — ED Provider Notes (Signed)
[facility]-EMERGENCY DEPT Provider Note   CSN: [id number] Arrival date & time: 11/13/16  0117  By signing my name below, I, Elder Negus, attest that this documentation has been prepared under the direction and in the presence of Juleon Narang, MD. Electronically Signed: Elder Negus, Scribe. 11/13/16. 2:06 AM.   History   Chief Complaint Chief Complaint  Patient presents with  . Shortness of Breath    HPI Jason Myers is a 66 y.o. male with history of prior CVA without deficits who presents to the ED for evaluation of dyspnea. This patient has no home oxygen requirement or respiratory history. He was watching television when he had sudden onset of dyspnea and called EMS. With them, he is in the low 80's on room air. Started on 3L nasal cannula by EMS with highest oxygen saturation at 88 percent. At interview, he denies any chest pain. He denies any leg swelling, cough, or wheezing. He has no other complaints than dyspnea. No fever.  The history is provided by the patient. No language interpreter was used.  Shortness of Breath  This is a new problem. The problem occurs continuously.The current episode started 1 to 2 hours ago. Pertinent negatives include no fever, no sore throat, no ear pain, no cough, no wheezing, no chest pain, no vomiting, no abdominal pain, no rash and no leg swelling. It is unknown what precipitated the problem. Risk factors: non walking secondary to stroke. He has tried nothing for the symptoms. The treatment provided no relief. He has had no prior ICU admissions. Associated medical issues do not include CAD.    Past Medical History:  Diagnosis Date  . BPH (benign prostatic hyperplasia)   . CVA (cerebral vascular accident) (HCC) 1984   left side affected.    . Hypercholesterolemia     Patient Active Problem List   Diagnosis Date Noted  . HYPERLIPIDEMIA 06/08/2007  . HEMORRHOIDS 06/08/2007    Past Surgical History:  Procedure Laterality Date  . LIPOMA  EXCISION Left    groin       Home Medications    Prior to Admission medications   Not on File    Family History No family history on file.  Social History Social History  Substance Use Topics  . Smoking status: Former Games developer  . Smokeless tobacco: Never Used  . Alcohol use Yes     Comment: occasionally     Allergies   Patient has no allergy information on record.   Review of Systems Review of Systems  Constitutional: Negative for chills and fever.  HENT: Negative for ear pain and sore throat.   Eyes: Negative for pain and visual disturbance.  Respiratory: Positive for shortness of breath. Negative for cough, chest tightness and wheezing.   Cardiovascular: Negative for chest pain, palpitations and leg swelling.  Gastrointestinal: Negative for abdominal pain and vomiting.  Genitourinary: Negative for dysuria and hematuria.  Musculoskeletal: Negative for arthralgias and back pain.  Skin: Negative for color change and rash.  Neurological: Negative for seizures and syncope.  All other systems reviewed and are negative.    Physical Exam Updated Vital Signs BP 109/90   Pulse (!) 106   Resp (!) 22   Ht  (1.854 m)   Wt 195 lb (88.5 kg)   SpO2 94%   BMI 25.73 kg/m   Physical Exam  Constitutional: He is oriented to person, place, and time. He appears well-developed and well-nourished.  HENT:  Head: Normocephalic and atraumatic.  Mouth/Throat: No  oropharyngeal exudate.  Eyes: Conjunctivae and EOM are normal. Pupils are equal, round, and reactive to light.  Neck: Normal range of motion. Neck supple. No JVD present. Carotid bruit is not present. No tracheal deviation present.  Cardiovascular: Normal rate, regular rhythm, normal heart sounds and intact distal pulses.   No murmur heard. Pulmonary/Chest: Effort normal. No stridor. Tachypnea noted. No respiratory distress. He has no wheezes. He has no rales.  Nasal cannula in place.   Abdominal: Soft. He exhibits  no mass. There is no tenderness. There is no rebound and no guarding.  Musculoskeletal: Normal range of motion. He exhibits no edema or tenderness.  Neurological: He is alert and oriented to person, place, and time. He displays normal reflexes. No cranial nerve deficit. He exhibits normal muscle tone. Coordination normal.  Skin: Skin is warm and dry. Capillary refill takes less than 2 seconds. He is not diaphoretic.  Psychiatric: He has a normal mood and affect.  Nursing note and vitals reviewed.    ED Treatments / Results   Vitals:   11/13/16 0336 11/13/16 0345  BP:  (!) 113/96  Pulse: (!) 106 (!) 109  Resp: (!) 22 17    Labs (all labs ordered are listed, but only abnormal results are displayed)  Results for orders placed or performed during the hospital encounter of 11/13/16  CBC with Differential/Platelet  Result Value Ref Range   WBC 11.4 (H) 4.0 - 10.5 K/uL   RBC 5.60 4.22 - 5.81 MIL/uL   Hemoglobin 16.9 13.0 - 17.0 g/dL   HCT 16.1 09.6 - 04.5 %   MCV 88.8 78.0 - 100.0 fL   MCH 30.2 26.0 - 34.0 pg   MCHC 34.0 30.0 - 36.0 g/dL   RDW 40.9 81.1 - 91.4 %   Platelets 188 150 - 400 K/uL   Neutrophils Relative % 73 %   Neutro Abs 8.4 (H) 1.7 - 7.7 K/uL   Lymphocytes Relative 16 %   Lymphs Abs 1.8 0.7 - 4.0 K/uL   Monocytes Relative 9 %   Monocytes Absolute 1.0 0.1 - 1.0 K/uL   Eosinophils Relative 2 %   Eosinophils Absolute 0.3 0.0 - 0.7 K/uL   Basophils Relative 0 %   Basophils Absolute 0.0 0.0 - 0.1 K/uL  Brain natriuretic peptide  Result Value Ref Range   B Natriuretic Peptide 42.7 0.0 - 100.0 pg/mL  I-stat chem 8, ed  Result Value Ref Range   Sodium 141 135 - 145 mmol/L   Potassium 3.9 3.5 - 5.1 mmol/L   Chloride 110 101 - 111 mmol/L   BUN 28 (H) 6 - 20 mg/dL   Creatinine, Ser 7.82 0.61 - 1.24 mg/dL   Glucose, Bld 956 (H) 65 - 99 mg/dL   Calcium, Ion 2.13 0.86 - 1.40 mmol/L   TCO2 25 0 - 100 mmol/L   Hemoglobin 17.0 13.0 - 17.0 g/dL   HCT 57.8 46.9 - 62.9 %    I-stat troponin, ED  Result Value Ref Range   Troponin i, poc 1.07 (HH) 0.00 - 0.08 ng/mL   Comment NOTIFIED PHYSICIAN    Comment 3           Dg Chest 2 View  Result Date: 11/13/2016 CLINICAL DATA:  Dyspnea for couple hours tonight. EXAM: CHEST  2 VIEW COMPARISON:  None. FINDINGS: The lungs are clear. The pulmonary vasculature is normal. Heart size is normal. Hilar and mediastinal contours are unremarkable. There is no pleural effusion. IMPRESSION: No active cardiopulmonary disease. Electronically  Signed   By: Ellery Plunk M.D.   On: 11/13/2016 02:03   Ct Angio Chest Pe W And/or Wo Contrast  Result Date: 11/13/2016 CLINICAL DATA:  Dyspnea starting this evening without chest pain EXAM: CT ANGIOGRAPHY CHEST WITH CONTRAST TECHNIQUE: Multidetector CT imaging of the chest was performed using the standard protocol during bolus administration of intravenous contrast. Multiplanar CT image reconstructions and MIPs were obtained to evaluate the vascular anatomy. CONTRAST:  100 cc Isovue-300 IV COMPARISON:  None. FINDINGS: Cardiovascular: Acute multilobar pulmonary emboli within the distal right and left pulmonary arteries extending into the lobar, segmental and subsegmental branches bilaterally. No pulmonary infarction. No effusion or pneumothorax. RV/LV ratio is 1.2 consistent with right heart strain. No aortic aneurysm or dissection. Coronary arteriosclerosis is noted. No pericardial effusion. Heart is top-normal in size. Mediastinum/Nodes: No enlarged mediastinal, hilar, or axillary lymph nodes. The trachea and esophagus demonstrate no significant findings. There is a dominant, partially solid and partially cystic right thyroid lobe nodule measuring 3.1 x 3.5 cm for which ultrasound is recommended. Lungs/Pleura: Bibasilar dependent atelectasis. No pulmonary infarcts. No pneumonic consolidation, effusion or pneumothorax. Upper Abdomen: No acute abnormality. Musculoskeletal: No chest wall abnormality. No  acute or significant osseous findings. Review of the MIP images confirms the above findings. IMPRESSION: 1. Positive for acute PE with CT evidence of right heart strain (RV/LV Ratio = 1.2) consistent with at least submassive (intermediate risk) PE. The presence of right heart strain has been associated with an increased risk of morbidity and mortality. Please activate Code PE by paging [phone]. Critical Value/emergent results were called by telephone at the time of interpretation on 11/13/2016 at 3:37 am to Dr. Cy Blamer , who verbally acknowledged these results. 2. 3.1 x 3.5 cm partially solid, partially cystic right thyroid nodule. Ultrasound is recommended. This follows ACR consensus guidelines: Managing Incidental Thyroid Nodules Detected on Imaging: White Paper of the ACR Incidental Thyroid Findings Committee. J Am Coll Radiol 2015; 12:143-150. Electronically Signed   By: Tollie Eth M.D.   On: 11/13/2016 03:38    Radiology Dg Chest 2 View  Result Date: 11/13/2016 CLINICAL DATA:  Dyspnea for couple hours tonight. EXAM: CHEST  2 VIEW COMPARISON:  None. FINDINGS: The lungs are clear. The pulmonary vasculature is normal. Heart size is normal. Hilar and mediastinal contours are unremarkable. There is no pleural effusion. IMPRESSION: No active cardiopulmonary disease. Electronically Signed   By: Ellery Plunk M.D.   On: 11/13/2016 02:03   Ct Angio Chest Pe W And/or Wo Contrast  Result Date: 11/13/2016 CLINICAL DATA:  Dyspnea starting this evening without chest pain EXAM: CT ANGIOGRAPHY CHEST WITH CONTRAST TECHNIQUE: Multidetector CT imaging of the chest was performed using the standard protocol during bolus administration of intravenous contrast. Multiplanar CT image reconstructions and MIPs were obtained to evaluate the vascular anatomy. CONTRAST:  100 cc Isovue-300 IV COMPARISON:  None. FINDINGS: Cardiovascular: Acute multilobar pulmonary emboli within the distal right and left pulmonary  arteries extending into the lobar, segmental and subsegmental branches bilaterally. No pulmonary infarction. No effusion or pneumothorax. RV/LV ratio is 1.2 consistent with right heart strain. No aortic aneurysm or dissection. Coronary arteriosclerosis is noted. No pericardial effusion. Heart is top-normal in size. Mediastinum/Nodes: No enlarged mediastinal, hilar, or axillary lymph nodes. The trachea and esophagus demonstrate no significant findings. There is a dominant, partially solid and partially cystic right thyroid lobe nodule measuring 3.1 x 3.5 cm for which ultrasound is recommended. Lungs/Pleura: Bibasilar dependent atelectasis. No pulmonary infarcts. No pneumonic  consolidation, effusion or pneumothorax. Upper Abdomen: No acute abnormality. Musculoskeletal: No chest wall abnormality. No acute or significant osseous findings. Review of the MIP images confirms the above findings. IMPRESSION: 1. Positive for acute PE with CT evidence of right heart strain (RV/LV Ratio = 1.2) consistent with at least submassive (intermediate risk) PE. The presence of right heart strain has been associated with an increased risk of morbidity and mortality. Please activate Code PE by paging [phone]. Critical Value/emergent results were called by telephone at the time of interpretation on 11/13/2016 at 3:37 am to Dr. Cy Blamer , who verbally acknowledged these results. 2. 3.1 x 3.5 cm partially solid, partially cystic right thyroid nodule. Ultrasound is recommended. This follows ACR consensus guidelines: Managing Incidental Thyroid Nodules Detected on Imaging: White Paper of the ACR Incidental Thyroid Findings Committee. J Am Coll Radiol 2015; 12:143-150. Electronically Signed   By: Tollie Eth M.D.   On: 11/13/2016 03:38    Procedures Procedures (including critical care time)  Medications Ordered in ED  Medications  heparin ADULT infusion 100 units/mL (25000 units/274mL sodium chloride 0.45%) (1,500 Units/hr  Intravenous New Bag/Given 11/13/16 0356)  albuterol (PROVENTIL) (2.5 MG/3ML) 0.083% nebulizer solution 5 mg (5 mg Nebulization Given 11/13/16 0211)  ipratropium (ATROVENT) nebulizer solution 0.5 mg (0.5 mg Nebulization Given 11/13/16 0211)  iopamidol (ISOVUE-370) 76 % injection (100 mLs  Contrast Given 11/13/16 0301)  heparin bolus via infusion 6,000 Units (6,000 Units Intravenous Bolus from Bag 11/13/16 0356)   MDM Reviewed: vitals and nursing note Interpretation: labs, x-ray, ECG and CT scan (positive troponin 1.07 No acute cardiopulmonary by me on CXR PE by me on CT) Total time providing critical care: 75-105 minutes. This excludes time spent performing separately reportable procedures and services. Consults: admitting MD and critical care  CRITICAL CARE Performed by: Jasmine Awe Total critical care time: 91 minutes Critical care time was exclusive of separately billable procedures and treating other patients. Critical care was necessary to treat or prevent imminent or life-threatening deterioration. Critical care was time spent personally by me on the following activities: development of treatment plan with patient and/or surrogate as well as nursing, discussions with consultants, evaluation of patient's response to treatment, examination of patient, obtaining history from patient or surrogate, ordering and performing treatments and interventions, ordering and review of laboratory studies, ordering and review of radiographic studies, pulse oximetry and re-evaluation of patient's condition.  Seen by critical care admit to medicine  Final Clinical Impressions(s) / ED Diagnoses  Bilateral PE and large thyroid nodule: will need a ultrasound    I personally performed the services described in this documentation, which was scribed in my presence. The recorded information has been reviewed and is accurate.       Cy Blamer, MD 11/13/16 [phone]

## 2016-11-13 NOTE — Progress Notes (Signed)
ANTICOAGULATION CONSULT NOTE - Initial Consult  Pharmacy Consult for heparin Indication: pulmonary embolus  No Known Allergies  Patient Measurements: Height:  (185.4 cm) Weight: 195 lb (88.5 kg) IBW/kg (Calculated) : 79.9  Vital Signs: BP: 127/90 (04/08 0245) Pulse Rate: 115 (04/08 0245)  Labs:  Recent Labs  11/13/16 0153 11/13/16 0214  HGB 16.9 17.0  HCT 49.7 50.0  PLT 188  --   CREATININE  --  0.90    Estimated Creatinine Clearance: 90 mL/min (by C-G formula based on SCr of 0.9 mg/dL).   Medical History: Past Medical History:  Diagnosis Date  . BPH (benign prostatic hyperplasia)   . CVA (cerebral vascular accident) (HCC) 1984   left side affected.    . Hypercholesterolemia     Assessment: 66yo male c/o SOB onset earlier in evening, when pt woke up from chair SOB had worsened, CT shows PE w/ evidence of RHS, to begin heparin.  Goal of Therapy:  Heparin level 0.3-0.7 units/ml Monitor platelets by anticoagulation protocol: Yes   Plan:  Will give heparin 6000 units IV bolus x1 followed by gtt at 1500 units/hr and monitor heparin levels and CBC.  Vernard Gambles, PharmD, BCPS  11/13/2016,3:40 AM

## 2016-11-14 LAB — CBC
HEMATOCRIT: 44.8 % (ref 39.0–52.0)
Hemoglobin: 15 g/dL (ref 13.0–17.0)
MCH: 29.7 pg (ref 26.0–34.0)
MCHC: 33.5 g/dL (ref 30.0–36.0)
MCV: 88.7 fL (ref 78.0–100.0)
PLATELETS: 179 10*3/uL (ref 150–400)
RBC: 5.05 MIL/uL (ref 4.22–5.81)
RDW: 13.9 % (ref 11.5–15.5)
WBC: 7.5 10*3/uL (ref 4.0–10.5)

## 2016-11-14 LAB — T3, FREE: T3 FREE: 3.3 pg/mL (ref 2.0–4.4)

## 2016-11-14 LAB — GLUCOSE, CAPILLARY: Glucose-Capillary: 71 mg/dL (ref 65–99)

## 2016-11-14 LAB — HEPARIN LEVEL (UNFRACTIONATED): HEPARIN UNFRACTIONATED: 0.56 [IU]/mL (ref 0.30–0.70)

## 2016-11-14 MED ORDER — ALBUTEROL SULFATE (2.5 MG/3ML) 0.083% IN NEBU: 5.0000 mg | INHALATION_SOLUTION | RESPIRATORY_TRACT | Status: DC | PRN

## 2016-11-14 NOTE — Progress Notes (Signed)
[facility] TEAM 1 - Stepdown/ICU TEAM  LINDELL RENFREW  WUJ:[id number] DOB: 09/04/1950 DOA: 11/13/2016 PCP: [facility]    Brief Narrative:  67 y.o. 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  No recent long distant traveling history. No tenderness in calfs. Patient was found to have O2 desaturating to 80% on room air, which improved to 88-90% on 3 L oxygen. In the ED he had a negative chest x-ray, and tachycardia. CT angio of chest showed submassive PE with evidence of right heart strain, and a 3.1 x 3.5 cm partially solid, partially cystic right thyroid nodule.    Subjective: The patient is resting comfortably in bed.  He denies current chest pain.  He feels the shortness of breath is slowly improving.  He denies nausea or vomiting and reports a good appetite.  Assessment & Plan:  PE w/ R popliteal DVT Heparin gtt to be transitioned to oral anticoag once Case Manager provides info on cost of same to pt and after discussion w/ Neuro as per PCCM suggestion   5.1cm R midpole Thyroid nodule  Dedicated thyroid US confirms presence of solid nodule - bx is indicated - will ask for FNA while pt in hospital   HLD Cont home medical tx   BPH  DVT prophylaxis: heparin gtt  Code Status: FULL CODE Family Communication: no family present at time of exam  Disposition Plan: eventual d/c home after thyroid bx completed and transitioned to oral agent   Consultants:  PCCM  Procedures: none  Antimicrobials:  none   Objective: Blood pressure (!) 130/115, pulse 91, temperature 98.2 F (36.8 C), temperature source Oral, resp. rate 20, height  (1.854 m), weight 81.7 kg (180 lb 3.2 oz), SpO2 93 %.  Intake/Output Summary (Last 24 hours) at 11/14/16 1450 Last data filed at 11/14/16 1423  Gross per 24 hour  Intake          2609.22 ml  Output             1550 ml  Net          1059.22 ml    Filed Weights   11/13/16 0128 11/14/16 0403  Weight: 88.5 kg (195 lb) 81.7 kg (180 lb 3.2 oz)    Examination: General: No acute respiratory distress Lungs: Clear to auscultation bilaterally without wheezes or crackles Cardiovascular: Regular rate and rhythm without murmur gallop or rub normal S1 and S2 Abdomen: Nontender, nondistended, soft, bowel sounds positive, no rebound, no ascites, no appreciable mass Extremities: No significant cyanosis, clubbing, or edema bilateral lower extremities  CBC:  Recent Labs Lab 11/13/16 0153 11/13/16 0214 11/14/16 0354  WBC 11.4*  --  7.5  NEUTROABS 8.4*  --   --   HGB 16.9 17.0 15.0  HCT 49.7 50.0 44.8  MCV 88.8  --  88.7  PLT 188  --  179   Basic Metabolic Panel:  Recent Labs Lab 11/13/16 0214 11/13/16 0437  NA 141 140  K 3.9 4.2  CL 110 106  CO2  --  23  GLUCOSE 125* 119*  BUN 28* 24*  CREATININE 0.90 0.91  CALCIUM  --  9.4   GFR: Estimated Creatinine Clearance: 89 mL/min (by C-G formula based on SCr of 0.91 mg/dL).  Liver Function Tests: No results for input(s): AST, ALT, ALKPHOS, BILITOT, PROT, ALBUMIN in the last 168 hours. No results for input(s): LIPASE, AMYLASE in the  last 168 hours. No results for input(s): AMMONIA in the last 168 hours.  Coagulation Profile: No results for input(s): INR, PROTIME in the last 168 hours.  Cardiac Enzymes:  Recent Labs Lab 11/13/16 0437 11/13/16 0857 11/13/16 1319 11/13/16 1950  TROPONINI 0.74* 0.50* 0.44* 0.44*    HbA1C: No results found for: HGBA1C  CBG:  Recent Labs Lab 11/14/16 0732  GLUCAP 71    Recent Results (from the past 240 hour(s))  MRSA PCR Screening     Status: None   Collection Time: 11/13/16  1:45 PM  Result Value Ref Range Status   MRSA by PCR NEGATIVE NEGATIVE Final    Comment:        The GeneXpert MRSA Assay (FDA approved for NASAL specimens only), is one component of a comprehensive MRSA colonization surveillance program. It is  not intended to diagnose MRSA infection nor to guide or monitor treatment for MRSA infections.      Scheduled Meds: . rosuvastatin  10 mg Oral q1800  . sodium chloride flush  3 mL Intravenous Q12H  . tamsulosin  0.4 mg Oral Daily   Continuous Infusions: . sodium chloride 75 mL/hr (11/14/16 0510)  . heparin 1,350 Units/hr (11/14/16 1354)     LOS: 1 day   Lonia Blood, MD [facility] Office  [phone] Pager - Text Page per Amion as per below:  On-Call/Text Page:      [email or web address]      password [email or web address]  If 7PM-7AM, please contact night-coverage [email or web address] Password [email or web address] 11/14/2016, 2:50 PM

## 2016-11-14 NOTE — Progress Notes (Signed)
PULMONARY / CRITICAL CARE MEDICINE   Name: INDALECIO MALMSTROM MRN: [id number] DOB: Jan 15, 1951    ADMISSION DATE:  11/13/2016 CONSULTATION DATE:  .11/13/16  REFERRING MD:  Dr. Nicanor Alcon  CHIEF COMPLAINT:  Pulmonary embolism  HISTORY OF PRESENT ILLNESS:   KEYONDRE HEPBURN is a 66 y.o. 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 He was in his usual state of health until this evening when he woke up abruptly with an unsettling feeling with his breathing from sleep. He tried to ignore the symptoms for about an hour before calling EMS to bring him in. He at baseline does not walk much due to his neurologic deficits. Here in the ED, he was found to have a saddle pulmonary embolism with evidence of right heart strain by RV:LV 1.2 and EKG pattern. In addition his troponin is 1. PCCM was consulted as part of a CODE PE.   Aside from his CVA, he only has the diagnoses of high cholesterol and BPH. He had a lipoma extracted years ago but no other surgeries or recent trauma. He has not noticed any symptoms of chest pain, shortness of breath, presyncope or syncope or changes in his legs.    SUBJECTIVE : No issues overnight. Less SOB.  (-) other subjective complaints.    VITAL SIGNS: BP (!) 134/98 (BP Location: Right Arm)   Pulse 94   Temp 97.7 F (36.5 C) (Oral)   Resp 18   Ht  (1.854 m)   Wt 81.7 kg (180 lb 3.2 oz)   SpO2 98%   BMI 23.77 kg/m   INTAKE / OUTPUT: I/O last 3 completed shifts: In: 2249.2 [P.O.:240; I.V.:2009.2] Out: 750 [Urine:750]  PHYSICAL EXAMINATION: General:  Alert, oriented, no acute distress Neuro:  Left extremities in contractures, able to bend left leg at the knee but otherwise weak, no facial asymetry HEENT:  MMM, clear oropharynx Cardiovascular:  Good s1/s2, no murmurs rubs or gallops Lungs:  CTAB. Good ae.  Abdomen:  Soft NT ND no rebound or gaurding Musculoskeletal:   Left hand and food in flexed contracture Skin:  No rashes  LABS:  BMET  Recent Labs Lab 11/13/16 0214 11/13/16 0437  NA 141 140  K 3.9 4.2  CL 110 106  CO2  --  23  BUN 28* 24*  CREATININE 0.90 0.91  GLUCOSE 125* 119*    Electrolytes  Recent Labs Lab 11/13/16 0437  CALCIUM 9.4    CBC  Recent Labs Lab 11/13/16 0153 11/13/16 0214 11/14/16 0354  WBC 11.4*  --  7.5  HGB 16.9 17.0 15.0  HCT 49.7 50.0 44.8  PLT 188  --  179    Coag's No results for input(s): APTT, INR in the last 168 hours.  Sepsis Markers No results for input(s): LATICACIDVEN, PROCALCITON, O2SATVEN in the last 168 hours.  ABG No results for input(s): PHART, PCO2ART, PO2ART in the last 168 hours.  Liver Enzymes No results for input(s): AST, ALT, ALKPHOS, BILITOT, ALBUMIN in the last 168 hours.  Cardiac Enzymes  Recent Labs Lab 11/13/16 0857 11/13/16 1319 11/13/16 1950  TROPONINI 0.50* 0.44* 0.44*    Glucose  Recent Labs Lab 11/14/16 0732  GLUCAP 71    Imaging No results found.   DISCUSSION: ASHAN CUEVA is a 66 y.o. 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    ASSESSMENT / PLAN: Acute/Subaacute VTE with Bilateral PE and R Popliteal Vein DVT, provoked, 2/2 chronic hemiplegia - patient has a h/o aneurysmal rupture in 1984, with extension to BG.  I suggest having Neurologist weigh in on whether pt can be on heparin/OAC or not.  If they are OK with OAC/heparin, can transition heparin drip to OAC.  If neurology says hold off on heparin/OAC, patient will need to have IVC filter placed. If ever, he will be on lifelong OAC.  - follow up echo - he will need a rpt CTA in 3-6 mos to  make sure he is clearing his PE and make sure he does not develop chronic PE and/or CTEPH - keep o2 sats > 88%  - plan d/w pt. - PCCM will sign off for now.  Call back if with issues.   Pollie Meyer, MD 11/14/2016, 11:47 AM [facility] Pager [phone] After 3 pm or if no answer, call [phone]

## 2016-11-14 NOTE — Progress Notes (Signed)
The patient is taking his Rosuvastatin from home and is refusing to give it to pharmacy for [place] to dispense. Self administering daily medication.   Sheppard Evens RN

## 2016-11-14 NOTE — Progress Notes (Signed)
ANTICOAGULATION CONSULT NOTE  Pharmacy Consult for heparin Indication: pulmonary embolus  No Known Allergies  Patient Measurements: Height:  (185.4 cm) Weight: 180 lb 3.2 oz (81.7 kg) IBW/kg (Calculated) : 79.9  Vital Signs: Temp: 97.7 F (36.5 C) (04/09 0733) Temp Source: Oral (04/09 0733) BP: 134/98 (04/09 0733) Pulse Rate: 94 (04/09 0733)  Labs:  Recent Labs  11/13/16 0153 11/13/16 0214  11/13/16 0437 11/13/16 0857 11/13/16 0902 11/13/16 1319 11/13/16 1950 11/14/16 0354  HGB 16.9 17.0  --   --   --   --   --   --  15.0  HCT 49.7 50.0  --   --   --   --   --   --  44.8  PLT 188  --   --   --   --   --   --   --  179  HEPARINUNFRC  --   --   --   --   --  1.10*  --  0.45 0.56  CREATININE  --  0.90  --  0.91  --   --   --   --   --   TROPONINI  --   --   < > 0.74* 0.50*  --  0.44* 0.44*  --   < > = values in this interval not displayed.  Estimated Creatinine Clearance: 89 mL/min (by C-G formula based on SCr of 0.91 mg/dL).  Assessment: 66yo male on IV heparin for new PE w/ RHS, and RLE DVT. Heparin level (0.56) therapeutic on 1350 units/hr. Hgb 15, pltc 179, stable. No bleeding noted per chart.  Goal of Therapy:  Heparin level 0.3-0.7 units/ml Monitor platelets by anticoagulation protocol: Yes   Plan:  Continue heparin 1350 units/hr  Daily heparin level and CBC Monitor for s/s bleeding.  Will f/u plans for oral anticoagulation.  Bayard Hugger, PharmD, BCPS  Clinical Pharmacist  Pager: [phone]   11/14/2016 8:32 AM

## 2016-11-14 NOTE — Plan of Care (Signed)
Problem: Safety: Goal: Ability to remain free from injury will improve Outcome: Progressing Patient's left arm is contracted and uses a cane at home. Educated and encouraged to call staff for assistance before getting out of bed.

## 2016-11-15 ENCOUNTER — Ambulatory Visit (HOSPITAL_COMMUNITY): Payer: Medicare HMO

## 2016-11-15 ENCOUNTER — Inpatient Hospital Stay (HOSPITAL_COMMUNITY): Payer: Medicare HMO

## 2016-11-15 DIAGNOSIS — I2699 Other pulmonary embolism without acute cor pulmonale: Secondary | ICD-10-CM

## 2016-11-15 LAB — COMPREHENSIVE METABOLIC PANEL
ALT: 22 U/L (ref 17–63)
ANION GAP: 6 (ref 5–15)
AST: 21 U/L (ref 15–41)
Albumin: 3.3 g/dL — ABNORMAL LOW (ref 3.5–5.0)
Alkaline Phosphatase: 95 U/L (ref 38–126)
BUN: 12 mg/dL (ref 6–20)
CALCIUM: 8.7 mg/dL — AB (ref 8.9–10.3)
CO2: 21 mmol/L — AB (ref 22–32)
Chloride: 112 mmol/L — ABNORMAL HIGH (ref 101–111)
Creatinine, Ser: 0.88 mg/dL (ref 0.61–1.24)
GFR calc non Af Amer: >60 mL/min (ref >60)
Glucose, Bld: 97 mg/dL (ref 65–99)
POTASSIUM: 4.1 mmol/L (ref 3.5–5.1)
Sodium: 139 mmol/L (ref 135–145)
TOTAL PROTEIN: 5.9 g/dL — AB (ref 6.5–8.1)
Total Bilirubin: 0.8 mg/dL (ref 0.3–1.2)

## 2016-11-15 LAB — ECHOCARDIOGRAM COMPLETE
AVLVOTPG: 2 mmHg
CHL CUP RV SYS PRESS: 26 mmHg
CHL CUP STROKE VOLUME: 39 mL
CHL CUP TV REG PEAK VELOCITY: 238 cm/s
EERAT: 6.24
FS: 24 % — AB (ref 28–44)
HEIGHTINCHES: 73 in
IVS/LV PW RATIO, ED: 1.2
LA ID, A-P, ES: 34 mm
LA diam index: 1.65 cm/m2
LA vol A4C: 40.4 ml
LDCA: 4.52 cm2
LEFT ATRIUM END SYS DIAM: 34 mm
LV E/e' medial: 6.24
LV E/e'average: 6.24
LV SIMPSON'S DISK: 44
LV TDI E'MEDIAL: 6.42
LV dias vol index: 43 mL/m2
LV dias vol: 88 mL (ref 62–150)
LV e' LATERAL: 8.16 cm/s
LV sys vol index: 24 mL/m2
LV sys vol: 49 mL (ref 21–61)
LVOT VTI: 13.6 cm
LVOTD: 24 mm
LVOTPV: 78.3 cm/s
LVOTSV: 61 mL
MV pk E vel: 50.9 m/s
MVPKAVEL: 70.3 m/s
PW: 8.87 mm — AB (ref 0.6–1.1)
TAPSE: 18 mm
TDI e' lateral: 8.16
TRMAXVEL: 238 cm/s
WEIGHTICAEL: 2873.6 [oz_av]

## 2016-11-15 LAB — CBC
HCT: 44.7 % (ref 39.0–52.0)
Hemoglobin: 15.4 g/dL (ref 13.0–17.0)
MCH: 30.4 pg (ref 26.0–34.0)
MCHC: 34.5 g/dL (ref 30.0–36.0)
MCV: 88.3 fL (ref 78.0–100.0)
Platelets: 177 10*3/uL (ref 150–400)
RBC: 5.06 MIL/uL (ref 4.22–5.81)
RDW: 13.6 % (ref 11.5–15.5)
WBC: 8.3 10*3/uL (ref 4.0–10.5)

## 2016-11-15 LAB — HEPARIN LEVEL (UNFRACTIONATED): Heparin Unfractionated: 0.56 IU/mL (ref 0.30–0.70)

## 2016-11-15 LAB — GLUCOSE, CAPILLARY: GLUCOSE-CAPILLARY: 96 mg/dL (ref 65–99)

## 2016-11-15 MED ORDER — LIDOCAINE HCL 1 % IJ SOLN
INTRAMUSCULAR | Status: AC
  Filled 2016-11-15: qty 20

## 2016-11-15 NOTE — Progress Notes (Signed)
OT Cancellation Note  Patient Details Name: Jason Myers MRN: [id number] DOB: 06-14-1951   Cancelled Treatment:    Reason Eval/Treat Not Completed: Patient at procedure or test/ unavailable  [facility] Katana Berthold, OT/L  [phone] 11/15/2016 11/15/2016, 2:11 PM

## 2016-11-15 NOTE — Procedures (Addendum)
   US guided right thyroid nodule biopsy 25 g needle 2 cc blood tinged serous fluid from cystic component 4 samples Pt tolerated well

## 2016-11-15 NOTE — Progress Notes (Signed)
  Echocardiogram 2D Echocardiogram has been performed.  Delcie Roch 11/15/2016, 10:45 AM

## 2016-11-15 NOTE — Evaluation (Signed)
Physical Therapy Evaluation Patient Details Name: Jason Myers MRN: [id number] DOB: Nov 07, 1950 Today's Date: 11/15/2016   History of Present Illness  66 y.o.male who presentedwith acute shortness breath. Pt found to have PE w/ R popliteal DVT. 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   Clinical Impression  Pt admitted with above diagnosis. Pt currently with functional limitations due to the deficits listed below (see PT Problem List). PTA pt was independent with all ADLs, ambulating with SPC. He does have significant gait pattern abnormalities but this is related to is previous stroke. He was able to ambulate 150 ft without an assistive device. Pt refused use of O2 and sats dropped to mid 80s by return to room. Quickly returning to 90s with rest and O2. Pt states that he is moving close to his usual pattern but is getting fatigued more quickly. Anticipate that the pt will D/C to home following acute stay. Pt will benefit from skilled PT to increase their independence and safety with mobility.      Follow Up Recommendations No PT follow up;Supervision - Intermittent    Equipment Recommendations  None recommended by PT    Recommendations for Other Services       Precautions / Restrictions Precautions Precautions: Fall Precaution Comments: Lt hemiparesis Restrictions Weight Bearing Restrictions: No      Mobility  Bed Mobility Overal bed mobility: Modified Independent             General bed mobility comments: supine<>sitting using rail to assist  Transfers Overall transfer level: Needs assistance Equipment used: None Transfers: Sit to/from Stand Sit to Stand: Supervision         General transfer comment: supervision for safety.   Ambulation/Gait Ambulation/Gait assistance: Min guard Ambulation Distance (Feet): 150 Feet Assistive device: None Gait Pattern/deviations: Step-to pattern;Ataxic;Wide base of support Gait velocity: decreased   General  Gait Details: Pt with wide BOS and prominant side lean to Rt. 1 mild loss of balance with independent recovery (pt reports Lt foot caught).   Stairs            Wheelchair Mobility    Modified Rankin (Stroke Patients Only)       Balance Overall balance assessment: Needs assistance Sitting-balance support: No upper extremity supported Sitting balance-Leahy Scale: Good     Standing balance support: No upper extremity supported Standing balance-Leahy Scale: Fair                               Pertinent Vitals/Pain Pain Assessment: No/denies pain    Home Living Family/patient expects to be discharged to:: Private residence Living Arrangements: Alone   Type of Home: Apartment Home Access: Elevator;Level entry     Home Layout: One level Home Equipment: Cane - single point      Prior Function Level of Independence: Independent with assistive device(s)         Comments: using SPC for ambulation     Hand Dominance        Extremity/Trunk Assessment   Upper Extremity Assessment Upper Extremity Assessment: LUE deficits/detail LUE Deficits / Details: pt maintaining flexion pattern, reports UE is nonfunctional.     Lower Extremity Assessment Lower Extremity Assessment: LLE deficits/detail LLE Deficits / Details: generalized weakness, pt reports due to previous CVA. Decreased coordination noted but functional with ambulation.        Communication   Communication: No difficulties  Cognition Arousal/Alertness: Awake/alert Behavior During Therapy: WFL for  tasks assessed/performed Overall Cognitive Status: Within Functional Limits for tasks assessed                                        General Comments General comments (skin integrity, edema, etc.): Pt refusing to use O2 during ambulation, cues for breathing provided. SpO2 dropping to 86% at end of ambulation. Back to mid 90s with rest an application of O2.     Exercises      Assessment/Plan    PT Assessment Patient needs continued PT services  PT Problem List Decreased strength;Decreased range of motion;Decreased activity tolerance;Decreased balance;Decreased mobility       PT Treatment Interventions DME instruction;Gait training;Functional mobility training;Stair training;Therapeutic activities;Therapeutic exercise;Balance training;Neuromuscular re-education;Patient/family education    PT Goals (Current goals can be found in the Care Plan section)  Acute Rehab PT Goals Patient Stated Goal: get back home PT Goal Formulation: With patient Time For Goal Achievement: 11/29/16 Potential to Achieve Goals: Good    Frequency Min 3X/week   Barriers to discharge        Co-evaluation               End of Session Equipment Utilized During Treatment: Gait belt Activity Tolerance: Patient limited by fatigue Patient left: with call bell/phone within reach (sitting EOB per pt request) Nurse Communication: Mobility status PT Visit Diagnosis: Unsteadiness on feet (R26.81);Muscle weakness (generalized) (M62.81)    Time: 1610-9604 PT Time Calculation (min) (ACUTE ONLY): 31 min   Charges:   PT Evaluation $PT Eval Moderate Complexity: 1 Procedure PT Treatments $Gait Training: 8-22 mins   PT G Codes:        Christiane Ha, PT, CSCS Pager [phone] Office [phone]   Delton See 11/15/2016, 12:43 PM

## 2016-11-15 NOTE — Care Management Note (Addendum)
Case Management Note  Patient Details  Name: Jason Myers MRN: [id number] Date of Birth: 03/29/1951  Subjective/Objective:  Pt presented for SOB- Positive for Pulmonary Embolism.                    Action/Plan: CM received referral for Medication Assistance-Eliquis, Xarelto and Pradaxa. CM will provide cost once completed.   Expected Discharge Date:  11/15/16               Expected Discharge Plan:  Home/Self Care  In-House Referral:  NA  Discharge planning Services  CM Consult, Medication Assistance  Post Acute Care Choice:  NA Choice offered to:  NA  DME Arranged:  N/A DME Agency:  NA  HH Arranged:  NA HH Agency:  NA  Status of Service:  Completed, signed off  If discussed at Long Length of Stay Meetings, dates discussed:    Additional Comments: 1004 11-18-16 Tomi Bamberger, RN, BSN [phone] Pt uses [facility] that delivers, however Pradaxa Medication is not in stock. CM did call to the [facility] and medication is in stock. Pt has 30 day free card. Staff RN to walk and pick up medication Pradaxa for the patient. Rx to be faxed with the 30 day free card.  CM did provide pt with Bus Pass for transportation home. No further needs from CM at this time.    1110 11-17-16 Tomi Bamberger, RN,BSN [phone] Pt is from home and the plan is to return home once stable. CM did reach out to [facility] in [place] in regards to [facility] and Personal Care Services. Pt was not approved for PCS, however Selena Batten stated that pt can appeal by November 27, 2016 and pt is aware. Pt was to have received a notice in the mail with the process for appeal. CM will provide pt with a 30 day free pradaxa card. No further needs from CM at this time.     1415 11-15-16 Tomi Bamberger, RN,BSN [phone] S/W [facility] @ [employer] RX # [phone]   ELIQUIS 10 MG- NO   1.ELIQUIS 2.5 MG   BID FOR 7 DAYS  COVER- YES  CO-PAY- 0.84 Q/L 2 PILLS  PER DAY  TIER- 3 DRUG  PRIOR APPROVAL- NO   2. ELIQUIS 5 MG BID  COVER- YES  CO-PAY- $ 3.70  TIER- 3 DRUG  PRIOR APPROVAL-NO   3. XARELTO 15 MG BID FOR 21 DAYS  COVER- YES  CO-PAY- $ 2.52 Q/L 2 PILLS PER DAY  TIER- 3 DRUG  PRIOR APPROVAL - NO   4 XARELTO 20 MG QD  COVER- YES  CO-PAY- $ 3.70  Q/L  TIER- 3 DRUG  PRIOR APPROVAL- NO   5. PRADAXA 150 MG BID  COVER- YES  CO-PAY- $ 3.70 Q/L 2 PILLS PER DAY  TIER- 4 DRUG  PRIOR APPROVAL- NO   PREFERRED PHARMACY : [place] [facility]  Gala Lewandowsky, RN 11/15/2016, 11:18 AM

## 2016-11-15 NOTE — Progress Notes (Addendum)
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 He was started on heparin drip. On CTA of the chest was noted to have thyroid nodule which was confirmed by ultrasound.   Assessment & Plan:   Principal Problem:   PE (pulmonary thromboembolism) (HCC) Active Problems:   HLD (hyperlipidemia)   BPH (benign prostatic hyperplasia)   Thyroid nodule  Pulmonary embolism with right lower extremity popliteal deep vein thrombosis -Currently on heparin drip -Case management to provide patient with cost information. I discussed with him risks benefits of normal anticoagulation agent will begin today along with dietary limitations/medication interaction of Coumadin with other drugs. Patient's priority is cost rather than other restrictions.Transition to oral regimen once determined.  -Spoke Dr Lavonna Monarch from neuro - ok with [facility] at this time given risk and benefit. If neurological status changes then order repeat CT head.   Right-sided thyroid nodule-5.1 cm -Patient to get ultrasound-guided FNA today. Follow-up pathology  Hyperlipidemia -Continue home medication  BPH Continue Flomax    DVT prophylaxis: Heparin drip Code Status: Full Family Communication:  None Disposition Plan: Transfer to telemetry  Consultants:   Pulm  Procedures:   [place] Bx today   Antimicrobials:   None   Subjective: Patient doesn't have any complaints at this time. After speaking to him at length regarding anticoagulation, his main concern is cost therefore would like to speak with case managers. No other complaints.  Objective: Vitals:   11/15/16 0000 11/15/16 0500 11/15/16 0852 11/15/16 1134  BP: 103/74 110/81  126/82 112/90  Pulse: 82 83  83  Resp: Temp: 98.1 F (36.7 C) 97.9 F (36.6 C) 97.7 F (36.5 C) 97.4 F (36.3 C)  TempSrc: Oral Oral Oral Oral  SpO2: 95% 94% 98% 94%  Weight:  81.5 kg (179 lb 9.6 oz)    Height:        Intake/Output Summary (Last 24 hours) at 11/15/16 1355 Last data filed at 11/15/16 1256  Gross per 24 hour  Intake          2397.77 ml  Output             2445 ml  Net           -47.23 ml   Filed Weights   11/13/16 0128 11/14/16 0403 11/15/16 0500  Weight: 88.5 kg (195 lb) 81.7 kg (180 lb 3.2 oz) 81.5 kg (179 lb 9.6 oz)    Examination:  General exam: Appears calm and comfortable  Respiratory system: Clear to auscultation. Respiratory effort normal. Cardiovascular system: S1 & S2 heard, RRR. No JVD, murmurs, rubs, gallops or clicks. No pedal edema. Gastrointestinal system: Abdomen is nondistended, soft and nontender. No organomegaly or masses felt. Normal bowel sounds heard. Central nervous system: Alert and oriented. No focal neurological deficits. Extremities: Symmetric 5 x 5 power. Skin: No rashes, lesions or ulcers Psychiatry: Judgement and insight appear normal. Mood & affect appropriate.     Data Reviewed:   CBC:  Recent Labs Lab 11/13/16 0153 11/13/16 0214 11/14/16 0354 11/15/16 0706  WBC 11.4*  --  7.5 8.3  NEUTROABS 8.4*  --   --   --   HGB 16.9 17.0 15.0 15.4  HCT  49.7 50.0 44.8 44.7  MCV 88.8  --  88.7 88.3  PLT 188  --  179 177   Basic Metabolic Panel:  Recent Labs Lab 11/13/16 0214 11/13/16 0437 11/15/16 0706  NA 141 140 139  K 3.9 4.2 4.1  CL 110 106 112*  CO2  --  23 21*  GLUCOSE 125* 119* 97  BUN 28* 24* 12  CREATININE 0.90 0.91 0.88  CALCIUM  --  9.4 8.7*   GFR: Estimated Creatinine Clearance: 92.1 mL/min (by C-G formula based on SCr of 0.88 mg/dL). Liver Function Tests:  Recent Labs Lab 11/15/16 0706  AST 21  ALT 22  ALKPHOS 95  BILITOT 0.8  PROT 5.9*  ALBUMIN 3.3*   No results for  input(s): LIPASE, AMYLASE in the last 168 hours. No results for input(s): AMMONIA in the last 168 hours. Coagulation Profile: No results for input(s): INR, PROTIME in the last 168 hours. Cardiac Enzymes:  Recent Labs Lab 11/13/16 0437 11/13/16 0857 11/13/16 1319 11/13/16 1950  TROPONINI 0.74* 0.50* 0.44* 0.44*   BNP (last 3 results) No results for input(s): PROBNP in the last 8760 hours. HbA1C: No results for input(s): HGBA1C in the last 72 hours. CBG:  Recent Labs Lab 11/14/16 0732 11/15/16 0754  GLUCAP 71 96   Lipid Profile: No results for input(s): CHOL, HDL, LDLCALC, TRIG, CHOLHDL, LDLDIRECT in the last 72 hours. Thyroid Function Tests:  Recent Labs  11/13/16 0446  TSH 0.954  FREET4 0.97  T3FREE 3.3   Anemia Panel: No results for input(s): VITAMINB12, FOLATE, FERRITIN, TIBC, IRON, RETICCTPCT in the last 72 hours. Sepsis Labs: No results for input(s): PROCALCITON, LATICACIDVEN in the last 168 hours.  Recent Results (from the past 240 hour(s))  MRSA PCR Screening     Status: None   Collection Time: 11/13/16  1:45 PM  Result Value Ref Range Status   MRSA by PCR NEGATIVE NEGATIVE Final    Comment:        The GeneXpert MRSA Assay (FDA approved for NASAL specimens only), is one component of a comprehensive MRSA colonization surveillance program. It is not intended to diagnose MRSA infection nor to guide or monitor treatment for MRSA infections.          Radiology Studies: No results found.      Scheduled Meds: . lidocaine      . rosuvastatin  10 mg Oral q1800  . sodium chloride flush  3 mL Intravenous Q12H  . tamsulosin  0.4 mg Oral Daily   Continuous Infusions: . sodium chloride 50 mL/hr at 11/15/16 0314  . heparin Stopped (11/15/16 0955)     LOS: 2 days    Time spent: 35 mins    Ankit Joline Maxcy, MD [facility] Pager [phone]   If 7PM-7AM, please contact night-coverage [email or web address] Password [email or web address] 11/15/2016,  1:55 PM

## 2016-11-15 NOTE — Progress Notes (Signed)
ANTICOAGULATION CONSULT NOTE  Pharmacy Consult for heparin Indication: pulmonary embolus  No Known Allergies  Patient Measurements: Height:  (185.4 cm) Weight: 179 lb 9.6 oz (81.5 kg) IBW/kg (Calculated) : 79.9  Vital Signs: Temp: 97.9 F (36.6 C) (04/10 0500) Temp Source: Oral (04/10 0500) BP: 110/81 (04/10 0500) Pulse Rate: 83 (04/10 0500)  Labs:  Recent Labs  11/13/16 0153 11/13/16 0214  11/13/16 0437 11/13/16 0857  11/13/16 1319 11/13/16 1950 11/14/16 0354 11/15/16 0706  HGB 16.9 17.0  --   --   --   --   --   --  15.0 15.4  HCT 49.7 50.0  --   --   --   --   --   --  44.8 44.7  PLT 188  --   --   --   --   --   --   --  179 177  HEPARINUNFRC  --   --   --   --   --   < >  --  0.45 0.56 0.56  CREATININE  --  0.90  --  0.91  --   --   --   --   --  0.88  TROPONINI  --   --   < > 0.74* 0.50*  --  0.44* 0.44*  --   --   < > = values in this interval not displayed.  Estimated Creatinine Clearance: 92.1 mL/min (by C-G formula based on SCr of 0.88 mg/dL).  Assessment: 66yo male on IV heparin for new PE w/ RHS, and RLE DVT. Heparin level (0.56) therapeutic on 1350 units/hr. Hgb 15.4, pltc 177, stable. No bleeding noted per chart. Pending decision on oral anticoagulation.  Goal of Therapy:  Heparin level 0.3-0.7 units/ml Monitor platelets by anticoagulation protocol: Yes   Plan:  Continue heparin 1350 units/hr  Daily heparin level and CBC Monitor for s/s bleeding.  Will f/u plans for oral anticoagulation.  Bayard Hugger, PharmD, BCPS  Clinical Pharmacist  Pager: [phone]   11/15/2016 8:45 AM

## 2016-11-16 LAB — CBC
HEMATOCRIT: 44.9 % (ref 39.0–52.0)
Hemoglobin: 15.1 g/dL (ref 13.0–17.0)
MCH: 29.5 pg (ref 26.0–34.0)
MCHC: 33.6 g/dL (ref 30.0–36.0)
MCV: 87.9 fL (ref 78.0–100.0)
Platelets: 198 10*3/uL (ref 150–400)
RBC: 5.11 MIL/uL (ref 4.22–5.81)
RDW: 13.3 % (ref 11.5–15.5)
WBC: 8.5 10*3/uL (ref 4.0–10.5)

## 2016-11-16 LAB — GLUCOSE, CAPILLARY: Glucose-Capillary: 98 mg/dL (ref 65–99)

## 2016-11-16 LAB — HEPARIN LEVEL (UNFRACTIONATED): HEPARIN UNFRACTIONATED: 0.4 [IU]/mL (ref 0.30–0.70)

## 2016-11-16 MED ORDER — DABIGATRAN ETEXILATE MESYLATE 150 MG PO CAPS
150.0000 mg | ORAL_CAPSULE | Freq: Two times a day (BID) | ORAL | Status: DC
Start: 2016-11-18 — End: 2016-11-18
  Administered 2016-11-18: 150 mg via ORAL
  Filled 2016-11-16: qty 1

## 2016-11-16 NOTE — Progress Notes (Signed)
Physical Therapy Treatment Patient Details Name: Jason Myers MRN: [id number] DOB: 19-Feb-1951 Today's Date: 11/16/2016    History of Present Illness 66 y.o.male who presentedwith acute shortness breath. Pt found to have PE w/ R popliteal DVT. 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     PT Comments    Pt able to ambulate 150 ft without an assistive device, min guard initially and progressing to supervision. SpO2 dropping to 87% (on RA) by end of ambulation and returning above 90% with cues for breathing. Pt reports feeling confident with returning home following his hospital stay. PT to follow to maximize mobility and safety during acute stay.   Follow Up Recommendations  No PT follow up;Supervision - Intermittent     Equipment Recommendations  None recommended by PT    Recommendations for Other Services       Precautions / Restrictions Precautions Precautions: Fall Restrictions Weight Bearing Restrictions: No    Mobility  Bed Mobility               General bed mobility comments: sitting EOB upon arrival  Transfers Overall transfer level: Needs assistance Equipment used: None Transfers: Sit to/from Stand Sit to Stand: Supervision         General transfer comment: good stability with standing  Ambulation/Gait Ambulation/Gait assistance: Min guard;Supervision Ambulation Distance (Feet): 150 Feet Assistive device: None Gait Pattern/deviations: Step-through pattern;Wide base of support Gait velocity: decreased   General Gait Details: Lateral trunk flexion rt, ataxic with LLE but able to ambulate initially with min guard and progressing to supervision. SpO2 dropping to 87% at end of session and quickly returing to 90s with cues for breathing (on RA).    Stairs            Wheelchair Mobility    Modified Rankin (Stroke Patients Only)       Balance Overall balance assessment: Needs assistance Sitting-balance support: Feet  supported;No upper extremity supported Sitting balance-Leahy Scale: Good     Standing balance support: No upper extremity supported Standing balance-Leahy Scale: Fair                              Cognition Arousal/Alertness: Awake/alert Behavior During Therapy: WFL for tasks assessed/performed Overall Cognitive Status: Within Functional Limits for tasks assessed                                        Exercises      General Comments        Pertinent Vitals/Pain Pain Assessment: No/denies pain    Home Living                      Prior Function            PT Goals (current goals can now be found in the care plan section) Acute Rehab PT Goals Patient Stated Goal: get back home. PT Goal Formulation: With patient Time For Goal Achievement: 11/29/16 Potential to Achieve Goals: Good Progress towards PT goals: Progressing toward goals    Frequency    Min 3X/week      PT Plan Current plan remains appropriate    Co-evaluation             End of Session Equipment Utilized During Treatment: Gait belt Activity Tolerance: Patient tolerated treatment well Patient left: with call  bell/phone within reach (pt requests sitting EOB) Nurse Communication: Mobility status PT Visit Diagnosis: Unsteadiness on feet (R26.81);Muscle weakness (generalized) (M62.81)     Time: 1610-9604 PT Time Calculation (min) (ACUTE ONLY): 20 min  Charges:  $Gait Training: 8-22 mins                    G Codes:       Christiane Ha, PT, CSCS Pager [phone] Office [phone]    Delton See 11/16/2016, 3:54 PM

## 2016-11-16 NOTE — Discharge Instructions (Addendum)
Pulmonary Embolism A pulmonary embolism (PE) is a sudden blockage or decrease of blood flow in one lung or both lungs. Most blockages come from a blood clot that travels from the legs or the pelvis to the lungs. PE is a dangerous and potentially life-threatening condition if it is not treated right away. What are the causes? A pulmonary embolism occurs most commonly when a blood clot travels from one of your veins to your lungs. Rarely, PE is caused by air, fat, amniotic fluid, or part of a tumor traveling through your veins to your lungs. What increases the risk? A PE is more likely to develop in:  People who smoke.  People who areolder, especially over 31 years of age.  People who are overweight (obese).  People who sit or lie still for a long time, such as during long-distance travel (over 4 hours), bed rest, hospitalization, or during recovery from certain medical conditions like a stroke.  People who do not engage in much physical activity (sedentary lifestyle).  People who have chronic breathing disorders.  People whohave a personal or family history of blood clots or blood clotting disease.  People whohave peripheral vascular disease (PVD), diabetes, or some types of cancer.  People who haveheart disease, especially if the person had a recent heart attack or has congestive heart failure.  People who have neurological diseases that affect the legs (leg paresis).  People who have had a traumatic injury, such as breaking a hip or leg.  People whohave recently had major or lengthy surgery, especially on the hip, knee, or abdomen.  People who have hada central line placed inside a large vein.  People who takemedicines that contain the hormone estrogen. These include birth control pills and hormone replacement therapy.  Pregnancy or during childbirth or the postpartum period. What are the signs or symptoms? The symptoms of a PE usually start suddenly and  include:  Shortness of breath while active or at rest.  Coughing or coughing up blood or blood-tinged mucus.  Chest pain that is often worse with deep breaths.  Rapid or irregular heartbeat.  Feeling light-headed or dizzy.  Fainting.  Feelinganxious.  Sweating. There may also be pain and swelling in a leg if that is where the blood clot started. These symptoms may represent a serious problem that is an emergency. Do not wait to see if the symptoms will go away. Get medical help right away. Call your local emergency services (911 in the [place] Do not drive yourself to the hospital.  How is this diagnosed? Your health care provider will take a medical history and perform a physical exam. You may also have other tests, including:  Blood tests to assess the clotting properties of your blood, assess oxygen levels in your blood, and find blood clots.  Imaging tests, such as CT, ultrasound, MRI, X-ray, and other tests to see if you have clots anywhere in your body.  An electrocardiogram (ECG) to look for heart strain from blood clots in the lungs. How is this treated? The main goals of PE treatment are:  To stop a blood clot from growing larger.  To stop new blood clots from forming. The type of treatment that you receive depends on many factors, such as the cause of your PE, your risk for bleeding or developing more clots, and other medical conditions that you have. Sometimes, a combination of treatments is necessary. This condition may be treated with:  Medicines, including newer oral blood thinners (anticoagulants), warfarin, low  molecular weight heparins, thrombolytics, or heparins. °· Wearing compression stockings or using different types of devices. °· Surgery (rare) to remove the blood clot or to place a filter in your abdomen to stop the blood clot from traveling to your lungs. °Treatments for a PE are often divided into immediate treatment, long-term treatment (up to 3 months  after PE), and extended treatment (more than 3 months after PE). Your treatment may continue for several months. This is called maintenance therapy, and it is used to prevent the forming of new blood clots. You can work with your health care provider to choose the treatment program that is best for you. °What are anticoagulants?  °Anticoagulants are medicines that treat PEs. They can stop current blood clots from growing and stop new clots from forming. They cannot dissolve existing clots. Your body dissolves clots by itself over time. Anticoagulants are given by mouth, by injection, or through an IV tube. °What are thrombolytics?  °Thrombolytics are clot-dissolving medicines that are used to dissolve a PE. They carry a high risk of bleeding, so they tend to be used only in severe cases or if you have very low blood pressure. °Follow these instructions at home: °If you are taking a newer oral anticoagulant:  °· Take the medicine every single day at the same time each day. °· Understand what foods and drugs interact with this medicine. °· Understand that there are no regular blood tests required when using this medicine. °· Understand the side effects of this medicine, including excessive bruising or bleeding. Ask your health care provider or pharmacist about other possible side effects. °If you are taking warfarin:  °· Understand how to take warfarin and know which foods can affect how warfarin works in your body. °· Understand that it is dangerous to take too much or too little warfarin. Too much warfarin increases the risk of bleeding. Too little warfarin continues to allow the risk for blood clots. °· Follow your PT and INR blood testing schedule. The PT and INR results allow your health care provider to adjust your dose of warfarin. It is very important that you have your PT and INR tested as often as told by your health care provider. °· Avoid major changes in your diet, or tell your health care provider before  you change your diet. Arrange a visit with a registered dietitian to answer your questions. Many foods, especially foods that are high in vitamin K, can interfere with warfarin and affect the PT and INR results. Eat a consistent amount of foods that are high in vitamin K, such as: °¨ Spinach, kale, broccoli, cabbage, collard greens, turnip greens, Brussels sprouts, peas, cauliflower, seaweed, and parsley. °¨ Beef liver and pork liver. °¨ Green tea. °¨ Soybean oil. °· Tell your health care provider about any and all medicines, vitamins, and supplements that you take, including aspirin and other over-the-counter anti-inflammatory medicines. Be especially cautious with aspirin and anti-inflammatory medicines. Do not take those before you ask your health care provider if it is safe to do so. This is important because many medicines can interfere with warfarin and affect the PT and INR results. °· Do not start or stop taking any over-the-counter or prescription medicine unless your health care provider or pharmacist tells you to do so. °If you take warfarin, you will also need to do these things: °· Hold pressure over cuts for longer than usual. °· Tell your dentist and other health care providers that you are taking warfarin before you have   any procedures in which bleeding may occur.  Avoid alcohol or drink very small amounts. Tell your health care provider if you change your alcohol intake.  Do not use tobacco products, including cigarettes, chewing tobacco, and e-cigarettes. If you need help quitting, ask your health care provider.  Avoid contact sports. General instructions   Take over-the-counter and prescription medicines only as told by your health care provider. Anticoagulant medicines can have side effects, including easy bruising and difficulty stopping bleeding. If you are prescribed an anticoagulant, you will also need to do these things:  Hold pressure over cuts for longer than usual.  Tell your  dentist and other health care providers that you are taking anticoagulants before you have any procedures in which bleeding may occur.  Avoid contact sports.  Wear a medical alert bracelet or carry a medical alert card that says you have had a PE.  Ask your health care provider how soon you can go back to your normal activities. Stay active to prevent new blood clots from forming.  Make sure to exercise while traveling or when you have been sitting or standing for a long period of time. It is very important to exercise. Exercise your legs by walking or by tightening and relaxing your leg muscles often. Take frequent walks.  Wear compression stockings as told by your health care provider to help prevent more blood clots from forming.  Do not use tobacco products, including cigarettes, chewing tobacco, and e-cigarettes. If you need help quitting, ask your health care provider.  Keep all follow-up appointments with your health care provider. This is important. How is this prevented? Take these actions to decrease your risk of developing another PE:  Exercise regularly. For at least 30 minutes every day, engage in:  Activity that involves moving your arms and legs.  Activity that encourages good blood flow through your body by increasing your heart rate.  Exercise your arms and legs every hour during long-distance travel (over 4 hours). Drink plenty of water and avoid drinking alcohol while traveling.  Avoid sitting or lying in bed for long periods of time without moving your legs.  Maintain a weight that is appropriate for your height. Ask your health care provider what weight is healthy for you.  If you are a woman who is over 46 years of age, avoid unnecessary use of medicines that contain estrogen. These include birth control pills.  Do not smoke, especially if you take estrogen medicines. If you need help quitting, ask your health care provider.  If you are at very high risk for  PE, wear compression stockings.  If you recently had a PE, have regularly scheduled ultrasound testing on your legs to check for new blood clots. If you are hospitalized, prevention measures may include:  Early walking after surgery, as soon as your health care provider says that it is safe.  Receiving anticoagulants to prevent blood clots. If you cannot take anticoagulants, other options may be available, such as wearing compression stockings or using different types of devices. Get help right away if:  You have new or increased pain, swelling, or redness in an arm or leg.  You have numbness or tingling in an arm or leg.  You have shortness of breath while active or at rest.  You have chest pain.  You have a rapid or irregular heartbeat.  You feel light-headed or dizzy.  You cough up blood.  You notice blood in your vomit, bowel movement, or urine.  You have a fever. These symptoms may represent a serious problem that is an emergency. Do not wait to see if the symptoms will go away. Get medical help right away. Call your local emergency services (911 in the [place] Do not drive yourself to the hospital. This information is not intended to replace advice given to you by your health care provider. Make sure you discuss any questions you have with your health care provider. Document Released: 07/22/2000 Document Revised: 12/31/2015 Document Reviewed: 11/19/2014 Elsevier Interactive Patient Education  2017 [employer]  Venous Thromboembolism Prevention Venous thromboembolism (VTE) is a condition in which a blood clot (thrombus) develops in the body. A thrombus usually occurs in a deep vein in the leg or the pelvis (DVT), but it can also occur in the arm. Sometimes, pieces of a thrombus can break off from its original place of development and travel through the bloodstream to other parts of the body. When that happens, the thrombus is called an embolus. An embolus that travels to one  or both lungs is called a pulmonary embolism. An embolism can block the blood flow in the blood vessels of other organs as well. VTE is a serious health condition that can cause disability or death. It is very important to get help right away and to not ignore symptoms. How can a VTE be prevented?  Exercise regularly. Take a brisk 30 minute walk every day. Staying active and moving around can help you to prevent blood clots.  Avoid sitting or lying in bed for long periods of time without moving your legs. Change your position often, especially during long-distance travel (over 4 hours).  If you are a woman who is over 67 years of age, avoid unnecessary use of medicines that contain estrogen. These include birth control pills and hormone replacement therapy.  Do not smoke, especially if you take estrogen medicines. If you need help quitting, ask your health care provider.  Eat plenty of fruits and vegetables. Ask your health care provider or dietitian if there are foods that you should avoid.  Maintain a weight that is appropriate for your height. Ask your health care provider what weight is healthy for you.  Wear loose-fitting clothing. Avoid constrictive or tight clothing around your legs or waist.  Try not to bump or injure your legs. Avoid crossing your legs when you are sitting.  Do not use pillows under your knees while lying down unless told by your health care provider.  Wear support hose (compression stockings or TED hose) as told by your health care provider Compression stockings increase blood flow in your legs and can help prevent blood clots. Do not let them bunch up when you are wearing them. How can I prevent VTE when I travel? Long-distance travel (over 4 hours) can increase the risk of a VTE. To prevent VTE when traveling:  Exercise your legs every hour by standing, stretching, and bending and straightening your legs. If you are traveling by airplane, train, or bus, walk up  and down the aisle as often as possible to get your blood moving. If you are traveling by car, stop and get out of the car every hour to exercise your legs and stretch. Other types of exercise might include:  Keeping your feet flat on the ground and raising your toes.  Switching from tightening the muscles in your calves and thighs to relaxing those same muscles while you are sitting.  Pointing and flexing your feet at the ankle joints  while you are sitting.  Stay well hydrated while traveling. Drink enough water to keep your urine clear or pale yellow.  Avoid drinking alcohol during long travel. Generally, it is not recommended that you take medicines to prevent DVT during routine travel. How can VTE be prevented if I am hospitalized? A VTE may be prevented by taking medicines that are prescribed to prevent blood clots (anticoagulants). You can also help to prevent VTE while in the hospital by taking these actions:  Get out of bed and walk. Ask your health care provider if this is safe for you to do.  Request the use of a sequential compression device (SCD). This is a machine that pumps air into compression sleeves that are wrapped around your legs.  Request the use of compression stockings, which are tight, elastic stockings that apply pressure to the lower legs. Compression stockings are sometimes used with SCDs. How can I prevent VTE after surgery? Understand that there is an increased risk for VTE for the first 4-6 weeks after surgery. During this time:  Avoid long-distance travel (over 4 hours). If you must travel during this time, ask your health care provider about additional preventive actions that you can take. These might include exercising your arms and legs every hour while you travel.  Avoid sitting or lying still for too long. If possible, get up and walk around one time every hour. Ask your health care provider when this is safe for you to do. Get help right away if:  You  have new or increased pain, swelling, or redness in an arm or leg.  You have numbness or tingling in an arm or leg.  You have shortness of breath while active or at rest.  You have chest pain.  You have a rapid or irregular heartbeat.  You feel light-headed or dizzy.  You cough up blood.  You notice blood in your vomit, bowel movement, or urine. These symptoms may represent a serious problem that is an emergency. Do not wait to see if the symptoms will go away. Get medical help right away. Call your local emergency services (911 in the [place] Do not drive yourself to the hospital. This information is not intended to replace advice given to you by your health care provider. Make sure you discuss any questions you have with your health care provider. Document Released: 07/13/2009 Document Revised: 12/31/2015 Document Reviewed: 11/19/2014 Elsevier Interactive Patient Education  2017 [employer].   Information on my medicine - Pradaxa (dabigatran)  This medication education was reviewed with me or my healthcare representative as part of my discharge preparation.    Why was Pradaxa prescribed for you? 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  Pradaxa will take the place of the injectable anticoagulant medication you have been receiving for this condition for the last 5-10 days.  What do you Need to know about PradAXa? Take your Pradaxa 150 mg TWICE DAILY - one capsule in the morning and one tablet in the evening with or without food.  It would be best to take the doses about the same time each day.  The capsules should not be broken, chewed or opened - they must be swallowed whole.  Do not store Pradaxa in other medication containers - once the bottle is opened the Pradaxa should be used within  FOUR months; throw away any  capsules that havent been by that time.  Take Pradaxa exactly as prescribed by your doctor.  DO NOT stop taking Pradaxa without talking to the doctor who prescribed the medication.  Refill your prescription before you run out.  After discharge, you should have regular check-up appointments with your healthcare provider that is prescribing your Pradaxa.  In the future your dose may need to be changed if your kidney function or weight changes by a significant amount.  What do you do if you miss a dose? If you miss a dose, take it as soon as you remember on the same day.  If your next dose is less than 6 hours away, skip the missed dose.  Do not take two doses of PRADAXA at the same time.  Important Safety Information A possible side effect of Pradaxa is bleeding. You should call your healthcare provider right away if you experience any of the following: ? Bleeding from an injury or your nose that does not stop. ? Unusual colored urine (red or dark brown) or unusual colored stools (red or black). ? Unusual bruising for unknown reasons. ? A serious fall or if you hit your head (even if there is no bleeding).  Some medicines may interact with Pradaxa and might increase your risk of bleeding or clotting while on Pradaxa. To help avoid this, consult your healthcare provider or pharmacist prior to using any new prescription or non-prescription medications, including herbals, vitamins, non-steroidal anti-inflammatory drugs (NSAIDs) and supplements.  This website has more information on Pradaxa (dabigatran): [email or web address].dk.

## 2016-11-16 NOTE — Evaluation (Signed)
Occupational Therapy Evaluation Patient Details Name: Jason Myers MRN: [id number] DOB: [birth year] Today's Date: 11/16/2016    History of Present Illness 66 y.o.male who presentedwith acute shortness breath. Pt found to have PE w/ R popliteal DVT. 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    Clinical Impression   Pt reports he was independent with ADL PTA. Currently pt overall min guard for functional mobility and min assist for ADL. Pt reports he feels close to his baseline level of mobility with the exception of fatigue and SOB with activity. SpO2 87% on RA with activity; educated on pursed lip breathing with return in SpO2 to mid 90s on RA. Began energy conservation education with pt. Pt planning to d/c home alone. Pt would benefit from continued skilled OT to address established goals.    Follow Up Recommendations  No OT follow up;Supervision - Intermittent    Equipment Recommendations  Tub/shower seat    Recommendations for Other Services       Precautions / Restrictions Precautions Precautions: Fall Restrictions Weight Bearing Restrictions: No      Mobility Bed Mobility Overal bed mobility: Modified Independent                Transfers Overall transfer level: Needs assistance Equipment used: None Transfers: Sit to/from Stand Sit to Stand: Min guard         General transfer comment: Min guard for safety    Balance Overall balance assessment: Needs assistance;History of Falls (~2 falls in past year) Sitting-balance support: Feet supported;No upper extremity supported Sitting balance-Leahy Scale: Good     Standing balance support: No upper extremity supported;During functional activity Standing balance-Leahy Scale: Fair                             ADL either performed or assessed with clinical judgement   ADL Overall ADL's : Needs assistance/impaired Eating/Feeding: Set up;Sitting   Grooming: Minimal assistance;Sitting   Upper Body Bathing: Minimal assistance;Sitting   Lower Body Bathing: Minimal assistance;Sit to/from stand   Upper Body Dressing : Min guard;Sitting   Lower Body Dressing: Minimal assistance;Sit to/from stand   Toilet Transfer: Min guard;Ambulation;Regular Teacher, adult education Details (indicate cue type and reason): Simulated by sit to stand from EOB with functional mobility.     Tub/ Shower Transfer: Min guard;Ambulation;Grab bars Tub/Shower Transfer Details (indicate cue type and reason): Simulated tub transfer in room Functional mobility during ADLs: Min guard General ADL Comments: Discussed use of shower chair for safety and energy conservation. Educated on pursed lip breathing and rest breaks with SOB. SpO2 down to 87% on RA with activity; returned to mid 90s with pursed lip breathing.     Vision         Perception     Praxis      Pertinent Vitals/Pain Pain Assessment: No/denies pain     Hand Dominance Right   Extremity/Trunk Assessment Upper Extremity Assessment Upper Extremity Assessment: LUE deficits/detail LUE Deficits / Details: pt maintaining flexion pattern, reports UE is nonfunctional.    Lower Extremity Assessment Lower Extremity Assessment: Defer to PT evaluation       Communication Communication Communication: No difficulties   Cognition Arousal/Alertness: Awake/alert Behavior During Therapy: WFL for tasks assessed/performed Overall Cognitive Status: Within Functional Limits for tasks assessed  General Comments       Exercises     Shoulder Instructions      Home Living Family/patient expects to be discharged to:: Private residence Living Arrangements: Alone   Type of Home: Apartment Home Access: Elevator     Home Layout: One level     Bathroom Shower/Tub: Tub/shower unit;Curtain   Firefighter: Standard     Home Equipment: Cane - single point          Prior  Functioning/Environment Level of Independence: Independent with assistive device(s)        Comments: occasional use of SPC for ambulation. independent with ADL        OT Problem List: Decreased strength;Decreased range of motion;Decreased activity tolerance;Impaired balance (sitting and/or standing);Decreased knowledge of use of DME or AE;Cardiopulmonary status limiting activity;Impaired tone;Impaired UE functional use      OT Treatment/Interventions: Self-care/ADL training;Energy conservation;DME and/or AE instruction;Therapeutic activities;Patient/family education;Balance training    OT Goals(Current goals can be found in the care plan section) Acute Rehab OT Goals Patient Stated Goal: get back home OT Goal Formulation: With patient Time For Goal Achievement: 11/30/16 Potential to Achieve Goals: Good ADL Goals Pt Will Perform Grooming: with modified independence;standing Pt Will Perform Upper Body Bathing: with modified independence;sitting;standing Pt Will Perform Lower Body Bathing: with modified independence;sit to/from stand Additional ADL Goal #1: Pt will independently verbalize 3 energy conservation strategies and use during ADL.  OT Frequency: Min 2X/week   Barriers to D/C: Decreased caregiver support  pt lives alone       Co-evaluation              End of Session Nurse Communication: Mobility status;Other (comment) (SpO2)  Activity Tolerance: Patient tolerated treatment well Patient left: in bed;with call bell/phone within reach;Other (comment) (sitting EOB)  OT Visit Diagnosis: Unsteadiness on feet (R26.81);Other abnormalities of gait and mobility (R26.89)                Time: 1610-9604 OT Time Calculation (min): 20 min Charges:  OT General Charges $OT Visit: 1 Procedure OT Evaluation $OT Eval Moderate Complexity: 1 Procedure G-Codes:     [name] A. Brett Albino, M.S., OTR/L Pager: [phone]  Gaye Alken 11/16/2016, 11:52 AM

## 2016-11-16 NOTE — Progress Notes (Addendum)
ANTICOAGULATION CONSULT NOTE  Pharmacy Consult for heparin Indication: pulmonary embolus  No Known Allergies  Patient Measurements: Height:  (185.4 cm) Weight: 185 lb 3.2 oz (84 kg) IBW/kg (Calculated) : 79.9  Vital Signs: Temp: 98 F (36.7 C) (04/11 0718) Temp Source: Oral (04/11 0718) BP: 117/90 (04/11 0718) Pulse Rate: 90 (04/11 0718)  Labs:  Recent Labs  11/13/16 0857  11/13/16 1319 11/13/16 1950  11/14/16 0354 11/15/16 0706 11/16/16 0401  HGB  --   --   --   --   < > 15.0 15.4 15.1  HCT  --   --   --   --   --  44.8 44.7 44.9  PLT  --   --   --   --   --  179 177 198  HEPARINUNFRC  --   < >  --  0.45  --  0.56 0.56 0.40  CREATININE  --   --   --   --   --   --  0.88  --   TROPONINI 0.50*  --  0.44* 0.44*  --   --   --   --   < > = values in this interval not displayed.  Estimated Creatinine Clearance: 92.1 mL/min (by C-G formula based on SCr of 0.88 mg/dL).  Assessment: 66yo male on IV heparin for new PE w/ RHS, and RLE DVT. Heparin level (0.4) therapeutic on 1350 units/hr. Hgb 15.1, pltc 198, stable. No bleeding noted per chart. Pending decision on oral anticoagulation.  Goal of Therapy:  Heparin level 0.3-0.7 units/ml Monitor platelets by anticoagulation protocol: Yes   Plan:  Continue heparin 1350 units/hr  Daily heparin level and CBC Monitor for s/s bleeding.  Will f/u plans for oral anticoagulation.  Bayard Hugger, PharmD, BCPS  Clinical Pharmacist  Pager: [phone]   11/16/2016 8:30 AM   Addendum: Plan to transition to Pradaxa after 5 days of therapeutic IV heparin. Heparin was started on 4/8 AM. Will need IV heparin through tomorrow and start pradaxa 4/13 AM  Plan:  Pradaxa 150 mg BID, first dose 4/13 at 1000 D/c IV heparin on 4/13 when first dose pradaxa is given  Bayard Hugger, PharmD, BCPS  Clinical Pharmacist  Pager: [phone]

## 2016-11-16 NOTE — Progress Notes (Signed)
[place] TEAM 1 - Stepdown/ICU TEAM  TOUA STITES  BJY:[id number] DOB: 1951/06/07 DOA: 11/13/2016 PCP: [facility]    Brief Narrative:  66 y.o. 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  No recent long distant traveling history. No tenderness in calfs. Patient was found to have O2 desaturating to 80% on room air, which improved to 88-90% on 3 L oxygen. In the ED he had a negative chest x-ray, and tachycardia. CT angio of chest showed submassive PE with evidence of right heart strain, and a 3.1 x 3.5 cm partially solid, partially cystic right thyroid nodule.    Subjective: No new complaints.  Feels sob w/ exertion.  Denies chest pain.  Reports good appetite.  No n/v or abdom pain.    Assessment & Plan:  PE w/ R popliteal DVT Heparin gtt to be transitioned to oral anticoag utilizing Pradaxa following pre-requisite 5 days of heparin tx - Pradaxa being chose due to availability of antidote - desaturated into mid 80s w/ ambulation w/ PT yesterday - obtain formal sat on ambulation to determine if short term O2 support indicated   5.1cm R midpole Thyroid nodule  Dedicated thyroid US confirms presence of solid nodule - bx completed 4/10 - path pending   HLD Cont home medical tx   BPH  DVT prophylaxis: heparin gtt  Code Status: FULL CODE Family Communication: no family present at time of exam  Disposition Plan: d/c home after transitioned to oral agent and determination made on possible need for home O2 w/ exertion   Consultants:  PCCM  Procedures: none  Antimicrobials:  none   Objective: Blood pressure 117/90, pulse 90, temperature 98 F (36.7 C), temperature source Oral, resp. rate 18, height  (1.854 m), weight 84 kg (185 lb 3.2 oz), SpO2 95 %.  Intake/Output Summary (Last 24 hours) at 11/16/16 1040 Last data filed at 11/16/16 0900  Gross per 24 hour  Intake           2156.07 ml  Output             4550 ml  Net         -2393.93 ml   Filed Weights   11/14/16 0403 11/15/16 0500 11/16/16 0534  Weight: 81.7 kg (180 lb 3.2 oz) 81.5 kg (179 lb 9.6 oz) 84 kg (185 lb 3.2 oz)    Examination: General: No acute respiratory distress at rest  Lungs: Clear to auscultation bilaterally w/o wheeze  Cardiovascular: Regular rate and rhythm without murmur  Abdomen: Nontender, nondistended, soft, bowel sounds positive, no rebound, no ascites, no appreciable mass Extremities: No significant edema bilateral lower extremities  CBC:  Recent Labs Lab 11/13/16 0153 11/13/16 0214 11/14/16 0354 11/15/16 0706 11/16/16 0401  WBC 11.4*  --  7.5 8.3 8.5  NEUTROABS 8.4*  --   --   --   --   HGB 16.9 17.0 15.0 15.4 15.1  HCT 49.7 50.0 44.8 44.7 44.9  MCV 88.8  --  88.7 88.3 87.9  PLT 188  --  179 177 198   Basic Metabolic Panel:  Recent Labs Lab 11/13/16 0214 11/13/16 0437 11/15/16 0706  NA 141 140 139  K 3.9 4.2 4.1  CL 110 106 112*  CO2  --  23 21*  GLUCOSE 125* 119* 97  BUN 28* 24* 12  CREATININE 0.90 0.91 0.88  CALCIUM  --  9.4 8.7*  GFR: Estimated Creatinine Clearance: 92.1 mL/min (by C-G formula based on SCr of 0.88 mg/dL).  Liver Function Tests:  Recent Labs Lab 11/15/16 0706  AST 21  ALT 22  ALKPHOS 95  BILITOT 0.8  PROT 5.9*  ALBUMIN 3.3*   Coagulation Profile: No results for input(s): INR, PROTIME in the last 168 hours.  Cardiac Enzymes:  Recent Labs Lab 11/13/16 0437 11/13/16 0857 11/13/16 1319 11/13/16 1950  TROPONINI 0.74* 0.50* 0.44* 0.44*    CBG:  Recent Labs Lab 11/14/16 0732 11/15/16 0754 11/16/16 0719  GLUCAP 71 96 98    Recent Results (from the past 240 hour(s))  MRSA PCR Screening     Status: None   Collection Time: 11/13/16  1:45 PM  Result Value Ref Range Status   MRSA by PCR NEGATIVE NEGATIVE Final    Comment:        The GeneXpert MRSA Assay (FDA approved for NASAL specimens only), is one  component of a comprehensive MRSA colonization surveillance program. It is not intended to diagnose MRSA infection nor to guide or monitor treatment for MRSA infections.      Scheduled Meds: . rosuvastatin  10 mg Oral q1800  . sodium chloride flush  3 mL Intravenous Q12H  . tamsulosin  0.4 mg Oral Daily   Continuous Infusions: . sodium chloride 50 mL/hr at 11/15/16 0314  . heparin 1,350 Units/hr (11/16/16 0800)     LOS: 3 days   Lonia Blood, MD [facility] Office  [phone] Pager - Text Page per Loretha Stapler as per below:  On-Call/Text Page:      [email or web address]      password [email or web address]  If 7PM-7AM, please contact night-coverage [email or web address] Password [email or web address] 11/16/2016, 10:40 AM

## 2016-11-17 LAB — HEPARIN LEVEL (UNFRACTIONATED): Heparin Unfractionated: 0.33 IU/mL (ref 0.30–0.70)

## 2016-11-17 LAB — GLUCOSE, CAPILLARY
Glucose-Capillary: 106 mg/dL — ABNORMAL HIGH (ref 65–99)
Glucose-Capillary: 132 mg/dL — ABNORMAL HIGH (ref 65–99)
Glucose-Capillary: 98 mg/dL (ref 65–99)

## 2016-11-17 LAB — CBC
HCT: 46.7 % (ref 39.0–52.0)
Hemoglobin: 15.6 g/dL (ref 13.0–17.0)
MCH: 29.5 pg (ref 26.0–34.0)
MCHC: 33.4 g/dL (ref 30.0–36.0)
MCV: 88.4 fL (ref 78.0–100.0)
PLATELETS: 209 10*3/uL (ref 150–400)
RBC: 5.28 MIL/uL (ref 4.22–5.81)
RDW: 13.8 % (ref 11.5–15.5)
WBC: 7.2 10*3/uL (ref 4.0–10.5)

## 2016-11-17 MED ORDER — DABIGATRAN ETEXILATE MESYLATE 150 MG PO CAPS: 150.0000 mg | ORAL_CAPSULE | Freq: Two times a day (BID) | ORAL | Status: DC

## 2016-11-17 NOTE — Progress Notes (Signed)
ANTICOAGULATION CONSULT NOTE - Follow Up Consult  Pharmacy Consult for Heparin Indication: pulmonary embolus and DVT  No Known Allergies  Patient Measurements: Height:  (185.4 cm) Weight: 180 lb 9.6 oz (81.9 kg) IBW/kg (Calculated) : 79.9  Vital Signs: Temp: 97.7 F (36.5 C) (04/12 0748) Temp Source: Oral (04/12 0748) BP: 122/97 (04/12 0748) Pulse Rate: 79 (04/12 0608)  Labs:  Recent Labs  11/15/16 0706 11/16/16 0401 11/17/16 0437  HGB 15.4 15.1 15.6  HCT 44.7 44.9 46.7  PLT 177 198 209  HEPARINUNFRC 0.56 0.40 0.33  CREATININE 0.88  --   --     Estimated Creatinine Clearance: 92.1 mL/min (by C-G formula based on SCr of 0.88 mg/dL).   Medications:  Scheduled:  . [START ON 11/18/2016] dabigatran  150 mg Oral Q12H  . rosuvastatin  10 mg Oral q1800  . sodium chloride flush  3 mL Intravenous Q12H  . tamsulosin  0.4 mg Oral Daily    Assessment: 66yo with new PE + right heart strain and RLE DVT.  Heparin level is therapeutic but has been drifting down over the last 48hr.  Hg and pltc are wnl and stable.  No bleeding noted.  Will bump up heparin rate to maintain goal range.  Pt to transition to Pradaxa tomorrow AM.  Goal of Therapy:  Heparin level 0.3-0.7 units/ml Monitor platelets by anticoagulation protocol: Yes   Plan:  Increase Heparin to 1400 units/hr Watch s/s of bleeding  Pradaxa  po bid and d/c heparin with first dose at 1000 on 4/13.  Marisue Humble, PharmD Clinical Pharmacist [facility]

## 2016-11-17 NOTE — Progress Notes (Signed)
Occupational Therapy Treatment Patient Details Name: Jason Myers MRN: [id number] DOB: [birth year] Today's Date: 11/17/2016    History of present illness 66 y.o.male who presentedwith acute shortness breath. Pt found to have PE w/ R popliteal DVT. 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    OT comments  Pt requires min guard for functional mobility with SpO2 in mid 80s on RA; quickly returns to mid 90s with pursed lip breathing. Educated pt on energy conservation strategies and provided handout. Pt able to demo pursed lip breathing independently during mobility. D/c plan remains appropriate. Will continue to follow acutely.   Follow Up Recommendations  No OT follow up;Supervision - Intermittent    Equipment Recommendations  Tub/shower seat    Recommendations for Other Services      Precautions / Restrictions Precautions Precautions: Fall Precaution Comments: Lt hemiparesis Restrictions Weight Bearing Restrictions: No       Mobility Bed Mobility Overal bed mobility: Modified Independent                Transfers Overall transfer level: Needs assistance Equipment used: None Transfers: Sit to/from Stand Sit to Stand: Supervision              Balance Overall balance assessment: Needs assistance Sitting-balance support: Feet supported;No upper extremity supported Sitting balance-Leahy Scale: Good     Standing balance support: No upper extremity supported;During functional activity Standing balance-Leahy Scale: Fair                             ADL either performed or assessed with clinical judgement   ADL Overall ADL's : Needs assistance/impaired                 Upper Body Dressing : Supervision/safety;Set up;Sitting   Lower Body Dressing: Min guard;Sit to/from stand;Set up   Toilet Transfer: Min guard;Ambulation           Functional mobility during ADLs: Min guard General ADL Comments: Educated pt on energy  conservation strategies and provided handout. Pt utilizing pursed lip breathing strategies during mobility. SpO2 in high 80s on RA with activity; quickly returns to 90s with pursed lip breathing.     Vision       Perception     Praxis      Cognition Arousal/Alertness: Awake/alert Behavior During Therapy: WFL for tasks assessed/performed Overall Cognitive Status: Within Functional Limits for tasks assessed                                          Exercises     Shoulder Instructions       General Comments      Pertinent Vitals/ Pain       Pain Assessment: No/denies pain  Home Living                                          Prior Functioning/Environment              Frequency  Min 2X/week        Progress Toward Goals  OT Goals(current goals can now be found in the care plan section)  Progress towards OT goals: Progressing toward goals  Acute Rehab OT Goals Patient Stated Goal: get back home. OT  Goal Formulation: With patient  Plan Discharge plan remains appropriate    Co-evaluation                 End of Session    OT Visit Diagnosis: Unsteadiness on feet (R26.81);Other abnormalities of gait and mobility (R26.89)   Activity Tolerance Patient tolerated treatment well   Patient Left in bed;with call bell/phone within reach   Nurse Communication          Time: 1610-9604 OT Time Calculation (min): 14 min  Charges: OT General Charges $OT Visit: 1 Procedure OT Treatments $Self Care/Home Management : 8-22 mins  Mccabe Gloria A. Brett Albino, M.S., OTR/L Pager: [phone]   Gaye Alken 11/17/2016, 1:15 PM

## 2016-11-17 NOTE — Care Management Important Message (Signed)
Important Message  Patient Details  Name: Jason Myers MRN: [id number] Date of Birth: 01-27-1951   Medicare Important Message Given:  Yes    Kyla Balzarine 11/17/2016, 1:12 PM

## 2016-11-17 NOTE — Progress Notes (Signed)
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 He was started on heparin drip. On CTA of the chest was noted to have thyroid nodule which was confirmed by ultrasound.   Assessment & Plan:   Principal Problem:   PE (pulmonary thromboembolism) (HCC) Active Problems:   HLD (hyperlipidemia)   BPH (benign prostatic hyperplasia)   Thyroid nodule  Pulmonary embolism with right lower extremity popliteal deep vein thrombosis -Currently on heparin drip today and transition to pradaxa tomorrow.  -Spoke Dr Lavonna Monarch from neuro on 4/10 - ok with [facility] at this time given risk and benefit. If neurological status changes then order repeat CT head.   Right-sided thyroid nodule-5.1 cm -s/p US guided biopsy- shows Benign follicular nodule.   Hyperlipidemia -Continue home medication  BPH Continue Flomax  Slightly O2 desat with ambulation but improved with cues to take deep breaths.   DVT prophylaxis: Heparin drip to Pradaxa tomorrow.  Code Status: Full Family Communication:  None Disposition Plan: likely discharge tomorrow.   Consultants:   Pulm  Procedures:   Thyroid bx 4/10  Antimicrobials:   None   Subjective: Patient doesn't have any complaints at this time. States his breathing is better.  Objective: Vitals:   11/16/16 2038 11/16/16 2300 11/17/16 0608 11/17/16 0748  BP: 100/71 (!) 119/91 121/89 (!) 122/97  Pulse: 97 83 79   Resp: 17 18    Temp: 98.3 F (36.8 C) 97.7 F (36.5 C) 98.6 F (37 C) 97.7 F (36.5 C)  TempSrc: Oral Oral Oral Oral  SpO2: 95% 95% 94% 92%  Weight:   81.9 kg (180 lb 9.6 oz)   Height:        Intake/Output Summary (Last 24 hours) at 11/17/16  1104 Last data filed at 11/17/16 0900  Gross per 24 hour  Intake          1618.93 ml  Output             1950 ml  Net          -331.07 ml   Filed Weights   11/15/16 0500 11/16/16 0534 11/17/16 0608  Weight: 81.5 kg (179 lb 9.6 oz) 84 kg (185 lb 3.2 oz) 81.9 kg (180 lb 9.6 oz)    Examination:  General exam: Appears calm and comfortable  Respiratory system: Clear to auscultation. Respiratory effort normal. Cardiovascular system: S1 & S2 heard, RRR. No JVD, murmurs, rubs, gallops or clicks. No pedal edema. Gastrointestinal system: Abdomen is nondistended, soft and nontender. No organomegaly or masses felt. Normal bowel sounds heard. Central nervous system: Alert and oriented. No focal neurological deficits. Extremities: Symmetric 5 x 5 power. Skin: No rashes, lesions or ulcers Psychiatry: Judgement and insight appear normal. Mood & affect appropriate.     Data Reviewed:   CBC:  Recent Labs Lab 11/13/16 0153 11/13/16 0214 11/14/16 0354 11/15/16 0706 11/16/16 0401 11/17/16 0437  WBC 11.4*  --  7.5 8.3 8.5 7.2  NEUTROABS 8.4*  --   --   --   --   --   HGB 16.9 17.0 15.0 15.4 15.1 15.6  HCT 49.7 50.0 44.8 44.7 44.9 46.7  MCV 88.8  --  88.7 88.3 87.9 88.4  PLT 188  --  179 177 198 209   Basic Metabolic Panel:  Recent Labs Lab 11/13/16 0214 11/13/16 0437 11/15/16 0706  NA 141 140 139  K 3.9 4.2 4.1  CL 110 106 112*  CO2  --  23 21*  GLUCOSE 125* 119* 97  BUN 28* 24* 12  CREATININE 0.90 0.91 0.88  CALCIUM  --  9.4 8.7*   GFR: Estimated Creatinine Clearance: 92.1 mL/min (by C-G formula based on SCr of 0.88 mg/dL). Liver Function Tests:  Recent Labs Lab 11/15/16 0706  AST 21  ALT 22  ALKPHOS 95  BILITOT 0.8  PROT 5.9*  ALBUMIN 3.3*   No results for input(s): LIPASE, AMYLASE in the last 168 hours. No results for input(s): AMMONIA in the last 168 hours. Coagulation Profile: No results for input(s): INR, PROTIME in the last 168 hours. Cardiac  Enzymes:  Recent Labs Lab 11/13/16 0437 11/13/16 0857 11/13/16 1319 11/13/16 1950  TROPONINI 0.74* 0.50* 0.44* 0.44*   BNP (last 3 results) No results for input(s): PROBNP in the last 8760 hours. HbA1C: No results for input(s): HGBA1C in the last 72 hours. CBG:  Recent Labs Lab 11/14/16 0732 11/15/16 0754 11/16/16 0719 11/17/16 0738  GLUCAP 71 96 98 98   Lipid Profile: No results for input(s): CHOL, HDL, LDLCALC, TRIG, CHOLHDL, LDLDIRECT in the last 72 hours. Thyroid Function Tests: No results for input(s): TSH, T4TOTAL, FREET4, T3FREE, THYROIDAB in the last 72 hours. Anemia Panel: No results for input(s): VITAMINB12, FOLATE, FERRITIN, TIBC, IRON, RETICCTPCT in the last 72 hours. Sepsis Labs: No results for input(s): PROCALCITON, LATICACIDVEN in the last 168 hours.  Recent Results (from the past 240 hour(s))  MRSA PCR Screening     Status: None   Collection Time: 11/13/16  1:45 PM  Result Value Ref Range Status   MRSA by PCR NEGATIVE NEGATIVE Final    Comment:        The GeneXpert MRSA Assay (FDA approved for NASAL specimens only), is one component of a comprehensive MRSA colonization surveillance program. It is not intended to diagnose MRSA infection nor to guide or monitor treatment for MRSA infections.          Radiology Studies: US Thyroid Biopsy  Result Date: 11/15/2016 INDICATION: Right thyroid nodule 5.1 cm EXAM: ULTRASOUND GUIDED FINE NEEDLE ASPIRATION OF INDETERMINATE THYROID NODULE COMPARISON:  US Thyroid 11/13/2016 MEDICATIONS: 7 cc 1% lidocaine COMPLICATIONS: None immediate. TECHNIQUE: Informed written consent was obtained from the patient after a discussion of the risks, benefits and alternatives to treatment. Questions regarding the procedure were encouraged and answered. A timeout was performed prior to the initiation of the procedure. Pre-procedural ultrasound scanning demonstrated unchanged size and appearance of the indeterminate nodule within  the right thyroid The procedure was planned. The neck was prepped in the usual sterile fashion, and a sterile drape was applied covering the operative field. A timeout was performed prior to the initiation of the procedure. Local anesthesia was provided with 1% lidocaine. Under direct ultrasound guidance, 4 FNA biopsies were performed of the right thyroid nodule with a 25 gauge needle. Multiple ultrasound images were saved for procedural documentation purposes. The samples were prepared and submitted to pathology. Limited post procedural scanning was negative for hematoma or additional complication. Dressings were placed. The patient tolerated the above procedures procedure well without immediate postprocedural complication. FINDINGS: FINDINGS Nodule reference number based on prior diagnostic ultrasound: 1 Maximum size:  5.1 cm Location: Right; Mid ACR TI-RADS risk category: TR3 (3 points) Reason for biopsy: meets  ACR TI-RADS criteria Ultrasound imaging confirms appropriate placement of the needles within the thyroid nodule. IMPRESSION: Technically successful ultrasound guided fine needle aspiration of right thyroid nodule Read by Robet Leu [facility] Electronically Signed   By: Malachy Moan M.D.   On: 11/15/2016 17:04        Scheduled Meds: . [START ON 11/18/2016] dabigatran  150 mg Oral Q12H  . rosuvastatin  10 mg Oral q1800  . sodium chloride flush  3 mL Intravenous Q12H  . tamsulosin  0.4 mg Oral Daily   Continuous Infusions: . sodium chloride 10 mL/hr at 11/16/16 1050  . heparin 1,400 Units/hr (11/17/16 0856)     LOS: 4 days    Time spent: 35 mins    Zigmund Linse Joline Maxcy, MD [facility] Pager [phone]   If 7PM-7AM, please contact night-coverage [email or web address] Password [facility] 11/17/2016, 11:04 AM

## 2016-11-18 LAB — HEPARIN LEVEL (UNFRACTIONATED): Heparin Unfractionated: 0.52 IU/mL (ref 0.30–0.70)

## 2016-11-18 LAB — CBC
HCT: 47.7 % (ref 39.0–52.0)
Hemoglobin: 16.2 g/dL (ref 13.0–17.0)
MCH: 29.9 pg (ref 26.0–34.0)
MCHC: 34 g/dL (ref 30.0–36.0)
MCV: 88 fL (ref 78.0–100.0)
PLATELETS: 203 10*3/uL (ref 150–400)
RBC: 5.42 MIL/uL (ref 4.22–5.81)
RDW: 13.5 % (ref 11.5–15.5)
WBC: 8.4 10*3/uL (ref 4.0–10.5)

## 2016-11-18 LAB — GLUCOSE, CAPILLARY: GLUCOSE-CAPILLARY: 93 mg/dL (ref 65–99)

## 2016-11-18 MED ORDER — DABIGATRAN ETEXILATE MESYLATE 150 MG PO CAPS: 150.0000 mg | ORAL_CAPSULE | Freq: Two times a day (BID) | ORAL | 3 refills | Status: DC

## 2016-11-18 MED FILL — PRADAXA 150 MG CAPSULE: 150 | 30 days supply | Qty: 60 | Fill #0

## 2016-11-18 NOTE — Discharge Summary (Signed)
Physician Discharge Summary  REASON HELZER ZOX:[id number] DOB: 1951-04-11 DOA: 11/13/2016  PCP: [facility]  Admit date: 11/13/2016 Discharge date: 11/18/2016  Admitted From: Home Disposition:  Home  Recommendations for Outpatient Follow-up:  1. Follow up with PCP in 2 weeks 2. Start Taking Pradaxa  BID for atleast 6 months. Your PCP will advise you if you need to take it for any longer or when it can safely be stopped.   Home Health: No Equipment/Devices: No Discharge Condition: Stable CODE STATUS: Full  Diet recommendation: Heart Healthy / Carb Modified   Brief/Interim Summary: 66 year old with past medical history of hemorrhagic stroke status post ruptured aneurysm in 1984, hyperlipidemia and BPH came to the hospital with complaints of acute shortness of breath. CTA of the chest was done which showed pulmonary embolism with right heart strain and was also noted to have thyroid nodule. He was started on heparin drip and was continued for about 5 days and then eventually switched to Pradaxa 150 mg to be taken twice daily for at least 6 months. Lower ext Dopplers were done which showed subacute DVT in left popliteal vein. Echocardiogram was also performed which showed ejection fraction of 50-55 percent with normal wall motion, grade 1 diastolic dysfunction, mild tricuspid regurgitation. Given his history of hemorrhagic stroke back in 1984 case was discussed with neurology and given risk-benefit it was deemed that it would be beneficial to treat him with anticoagulation despite of his history. Meanwhile will need to continue to monitor outpatient. Ultrasound of the thyroid nodule was done which was systemic and it was deemed necessary to get a biopsy therefore FNA was done which showed benign follicular nodule. Over the course of a few days his breathing improved significantly and his oxygen requirement were very minimal. The day of discharge she was doing well on room air with saturating  over 93% on room air. Today he has reached maximum benefit from inpatient hospital stay therefore will be discharged with outpatient follow-up with the primary care doctor in about 2 weeks. Follow up recommendations stated above.  Discharge Diagnoses:  Principal Problem:   PE (pulmonary thromboembolism) (HCC) Active Problems:   HLD (hyperlipidemia)   BPH (benign prostatic hyperplasia)   Thyroid nodule  Pulmonary embolism with right lower extremity popliteal deep vein thrombosis start Pradaxa. 30 day free supply card given.  -Spoke Dr Lavonna Monarch from neuro on 4/10 - ok with [facility] at this time given risk and benefit. If neurological status changes then order repeat CT head.   Right-sided thyroid nodule-5.1 cm -s/p US guided biopsy- shows Benign follicular nodule.   Hyperlipidemia -Continue home medication  BPH Continue Flomax  Discharge Instructions   Allergies as of 11/18/2016   No Known Allergies     Medication List    TAKE these medications   dabigatran 150 MG Caps capsule Commonly known as:  PRADAXA Take 1 capsule (150 mg total) by mouth every 12 (twelve) hours.   rosuvastatin 10 MG tablet Commonly known as:  CRESTOR Take 10 mg by mouth daily.   tamsulosin 0.4 MG Caps capsule Commonly known as:  FLOMAX Take 0.4 mg by mouth daily.      Follow-up Information    [facility] Follow up in 2 week(s).   Specialty:  Internal Medicine Contact information: [address] [place] [address] [phone]          No Known Allergies  Consultations:  Pulm   Procedures/Studies: Dg Chest 2 View  Result Date: 11/13/2016 CLINICAL  DATA:  Dyspnea for couple hours tonight. EXAM: CHEST  2 VIEW COMPARISON:  None. FINDINGS: The lungs are clear. The pulmonary vasculature is normal. Heart size is normal. Hilar and mediastinal contours are unremarkable. There is no pleural effusion. IMPRESSION: No active cardiopulmonary disease. Electronically Signed   By: Ellery Plunk M.D.   On: 11/13/2016 02:03   Ct Angio Chest Pe W And/or Wo Contrast  Result Date: 11/13/2016 CLINICAL DATA:  Dyspnea starting this evening without chest pain EXAM: CT ANGIOGRAPHY CHEST WITH CONTRAST TECHNIQUE: Multidetector CT imaging of the chest was performed using the standard protocol during bolus administration of intravenous contrast. Multiplanar CT image reconstructions and MIPs were obtained to evaluate the vascular anatomy. CONTRAST:  100 cc Isovue-300 IV COMPARISON:  None. FINDINGS: Cardiovascular: Acute multilobar pulmonary emboli within the distal right and left pulmonary arteries extending into the lobar, segmental and subsegmental branches bilaterally. No pulmonary infarction. No effusion or pneumothorax. RV/LV ratio is 1.2 consistent with right heart strain. No aortic aneurysm or dissection. Coronary arteriosclerosis is noted. No pericardial effusion. Heart is top-normal in size. Mediastinum/Nodes: No enlarged mediastinal, hilar, or axillary lymph nodes. The trachea and esophagus demonstrate no significant findings. There is a dominant, partially solid and partially cystic right thyroid lobe nodule measuring 3.1 x 3.5 cm for which ultrasound is recommended. Lungs/Pleura: Bibasilar dependent atelectasis. No pulmonary infarcts. No pneumonic consolidation, effusion or pneumothorax. Upper Abdomen: No acute abnormality. Musculoskeletal: No chest wall abnormality. No acute or significant osseous findings. Review of the MIP images confirms the above findings. IMPRESSION: 1. Positive for acute PE with CT evidence of right heart strain (RV/LV Ratio = 1.2) consistent with at least submassive (intermediate risk) PE. The presence of right heart strain has been associated with an increased risk of morbidity and mortality. Please activate Code PE by paging [phone]. Critical Value/emergent results were called by telephone at the time of interpretation on 11/13/2016 at 3:37 am to Dr. Cy Blamer ,  who verbally acknowledged these results. 2. 3.1 x 3.5 cm partially solid, partially cystic right thyroid nodule. Ultrasound is recommended. This follows ACR consensus guidelines: Managing Incidental Thyroid Nodules Detected on Imaging: White Paper of the ACR Incidental Thyroid Findings Committee. J Am Coll Radiol 2015; 12:143-150. Electronically Signed   By: Tollie Eth M.D.   On: 11/13/2016 03:38   US Thyroid Biopsy  Result Date: 11/15/2016 INDICATION: Right thyroid nodule 5.1 cm EXAM: ULTRASOUND GUIDED FINE NEEDLE ASPIRATION OF INDETERMINATE THYROID NODULE COMPARISON:  US Thyroid 11/13/2016 MEDICATIONS: 7 cc 1% lidocaine COMPLICATIONS: None immediate. TECHNIQUE: Informed written consent was obtained from the patient after a discussion of the risks, benefits and alternatives to treatment. Questions regarding the procedure were encouraged and answered. A timeout was performed prior to the initiation of the procedure. Pre-procedural ultrasound scanning demonstrated unchanged size and appearance of the indeterminate nodule within the right thyroid The procedure was planned. The neck was prepped in the usual sterile fashion, and a sterile drape was applied covering the operative field. A timeout was performed prior to the initiation of the procedure. Local anesthesia was provided with 1% lidocaine. Under direct ultrasound guidance, 4 FNA biopsies were performed of the right thyroid nodule with a 25 gauge needle. Multiple ultrasound images were saved for procedural documentation purposes. The samples were prepared and submitted to pathology. Limited post procedural scanning was negative for hematoma or additional complication. Dressings were placed. The patient tolerated the above procedures procedure well without immediate postprocedural complication. FINDINGS: FINDINGS Nodule reference number based  on prior diagnostic ultrasound: 1 Maximum size:  5.1 cm Location: Right; Mid ACR TI-RADS risk category: TR3 (3 points)  Reason for biopsy: meets ACR TI-RADS criteria Ultrasound imaging confirms appropriate placement of the needles within the thyroid nodule. IMPRESSION: Technically successful ultrasound guided fine needle aspiration of right thyroid nodule Read by Robet Leu [facility] Electronically Signed   By: Malachy Moan M.D.   On: 11/15/2016 17:04   US Thyroid  Result Date: 11/13/2016 CLINICAL DATA:  Right thyroid nodule by CT EXAM: THYROID ULTRASOUND TECHNIQUE: Ultrasound examination of the thyroid gland and adjacent soft tissues was performed. COMPARISON:  11/13/2016 FINDINGS: Parenchymal Echotexture: Mildly heterogenous Isthmus: 4 mm Right lobe: 6.9 x 3.1 x 4.1 cm Left lobe: 4.8 x 1.5 x 1.6 cm _________________________________________________________ Estimated total number of nodules >/= 1 cm: 1 Number of spongiform nodules >/=  2 cm not described below (TR1): 0 Number of mixed cystic and solid nodules >/= 1.5 cm not described below (TR2): 0 _________________________________________________________ Nodule # 1: Location: Right; Mid Maximum size: 5.1 cm; Other 2 dimensions: 2.9 x 3.8 cm Composition: solid/almost completely solid (2) Echogenicity: isoechoic (1) Shape: not taller-than-wide (0) Margins: ill-defined (0) Echogenic foci: none (0) ACR TI-RADS total points: 3. ACR TI-RADS risk category: TR3 (3 points). ACR TI-RADS recommendations: **Given size (>/= 2.5 cm) and appearance, fine needle aspiration of this mildly suspicious nodule should be considered based on TI-RADS criteria. _________________________________________________________ IMPRESSION: 5.1 cm right midpole solid isoechoic nodule (TR 3 nodule). This nodule meets criteria for biopsy as above. The above is in keeping with the ACR TI-RADS recommendations - J Am Coll Radiol 2017;14:587-595. Electronically Signed   By: Judie Petit.  Shick M.D.   On: 11/13/2016 11:46       Subjective:   Discharge Exam: Vitals:   11/18/16 0540 11/18/16 0748  BP: 118/71 (!)  128/91  Pulse:  82  Resp: 17 18  Temp: 98.7 F (37.1 C) 98.7 F (37.1 C)   Vitals:   11/17/16 2000 11/18/16 0000 11/18/16 0540 11/18/16 0748  BP: 122/84 117/81 118/71 (!) 128/91  Pulse: 97 87  82  Resp: Temp: 98.1 F (36.7 C) 98.2 F (36.8 C) 98.7 F (37.1 C) 98.7 F (37.1 C)  TempSrc: Oral Oral Oral Oral  SpO2: 95% 90% 94% 90%  Weight:   81.9 kg (180 lb 9.6 oz)   Height:    (1.854 m)     General: Pt is alert, awake, not in acute distress Cardiovascular: RRR, S1/S2 +, no rubs, no gallops Respiratory: CTA bilaterally, no wheezing, no rhonchi Abdominal: Soft, NT, ND, bowel sounds + Extremities: no edema, no cyanosis    The results of significant diagnostics from this hospitalization (including imaging, microbiology, ancillary and laboratory) are listed below for reference.     Microbiology: Recent Results (from the past 240 hour(s))  MRSA PCR Screening     Status: None   Collection Time: 11/13/16  1:45 PM  Result Value Ref Range Status   MRSA by PCR NEGATIVE NEGATIVE Final    Comment:        The GeneXpert MRSA Assay (FDA approved for NASAL specimens only), is one component of a comprehensive MRSA colonization surveillance program. It is not intended to diagnose MRSA infection nor to guide or monitor treatment for MRSA infections.      Labs: BNP (last 3 results)  Recent Labs  11/13/16 0153  BNP 42.7   Basic Metabolic Panel:  Recent Labs Lab 11/13/16 0214  11/13/16 0437 11/15/16 0706  NA 141 140 139  K 3.9 4.2 4.1  CL 110 106 112*  CO2  --  23 21*  GLUCOSE 125* 119* 97  BUN 28* 24* 12  CREATININE 0.90 0.91 0.88  CALCIUM  --  9.4 8.7*   Liver Function Tests:  Recent Labs Lab 11/15/16 0706  AST 21  ALT 22  ALKPHOS 95  BILITOT 0.8  PROT 5.9*  ALBUMIN 3.3*   No results for input(s): LIPASE, AMYLASE in the last 168 hours. No results for input(s): AMMONIA in the last 168 hours. CBC:  Recent Labs Lab 11/13/16 0153   11/14/16 0354 11/15/16 0706 11/16/16 0401 11/17/16 0437 11/18/16 0359  WBC 11.4*  --  7.5 8.3 8.5 7.2 8.4  NEUTROABS 8.4*  --   --   --   --   --   --   HGB 16.9  < > 15.0 15.4 15.1 15.6 16.2  HCT 49.7  < > 44.8 44.7 44.9 46.7 47.7  MCV 88.8  --  88.7 88.3 87.9 88.4 88.0  PLT 188  --  179 177 198 209 203  < > = values in this interval not displayed. Cardiac Enzymes:  Recent Labs Lab 11/13/16 0437 11/13/16 0857 11/13/16 1319 11/13/16 1950  TROPONINI 0.74* 0.50* 0.44* 0.44*   BNP: Invalid input(s): POCBNP CBG:  Recent Labs Lab 11/16/16 0719 11/17/16 0738 11/17/16 2050 11/17/16 2352 11/18/16 0746  GLUCAP 98 98 132* 106* 93   D-Dimer No results for input(s): DDIMER in the last 72 hours. Hgb A1c No results for input(s): HGBA1C in the last 72 hours. Lipid Profile No results for input(s): CHOL, HDL, LDLCALC, TRIG, CHOLHDL, LDLDIRECT in the last 72 hours. Thyroid function studies No results for input(s): TSH, T4TOTAL, T3FREE, THYROIDAB in the last 72 hours.  Invalid input(s): FREET3 Anemia work up No results for input(s): VITAMINB12, FOLATE, FERRITIN, TIBC, IRON, RETICCTPCT in the last 72 hours. Urinalysis No results found for: COLORURINE, APPEARANCEUR, LABSPEC, PHURINE, GLUCOSEU, HGBUR, BILIRUBINUR, KETONESUR, PROTEINUR, UROBILINOGEN, NITRITE, LEUKOCYTESUR Sepsis Labs Invalid input(s): PROCALCITONIN,  WBC,  LACTICIDVEN Microbiology Recent Results (from the past 240 hour(s))  MRSA PCR Screening     Status: None   Collection Time: 11/13/16  1:45 PM  Result Value Ref Range Status   MRSA by PCR NEGATIVE NEGATIVE Final    Comment:        The GeneXpert MRSA Assay (FDA approved for NASAL specimens only), is one component of a comprehensive MRSA colonization surveillance program. It is not intended to diagnose MRSA infection nor to guide or monitor treatment for MRSA infections.      Time coordinating discharge: Over 30 minutes  SIGNED:   Dimple Nanas, MD  [facility] 11/18/2016, 9:17 AM Pager   If 7PM-7AM, please contact night-coverage [email or web address] Password [email or web address]

## 2016-11-18 NOTE — Progress Notes (Signed)
ANTICOAGULATION CONSULT NOTE - Follow Up Consult  Pharmacy Consult for Heparin, transitioning to Dabigatran Indication: pulmonary embolus and DVT  No Known Allergies  Patient Measurements: Height:  (185.4 cm) Weight: 180 lb 9.6 oz (81.9 kg) IBW/kg (Calculated) : 79.9  Vital Signs: Temp: 98.7 F (37.1 C) (04/13 0748) Temp Source: Oral (04/13 0748) BP: 128/91 (04/13 0748) Pulse Rate: 82 (04/13 0748)  Labs:  Recent Labs  11/16/16 0401 11/17/16 0437 11/18/16 0359  HGB 15.1 15.6 16.2  HCT 44.9 46.7 47.7  PLT 198 209 203  HEPARINUNFRC 0.40 0.33 0.52    Estimated Creatinine Clearance: 92.1 mL/min (by C-G formula based on SCr of 0.88 mg/dL).   Medications:  Scheduled:  . dabigatran  150 mg Oral Q12H  . rosuvastatin  10 mg Oral q1800  . sodium chloride flush  3 mL Intravenous Q12H  . tamsulosin  0.4 mg Oral Daily    Assessment: 66yo male with PE and subacute DVT completing day#5 of heparin and transitioning to Dabigatran this AM.  Orders are in place.  Heparin level is therapeutic this AM and Hg/pltc remain stable and wnl.  No bleeding noted.    Pt has been educated on Dabigatran and reviewed this AM with pt.  Goal of Therapy:  Heparin level 0.3-0.7 units/ml Monitor platelets by anticoagulation protocol: Yes   Plan:  Turn heparin off at 10AM Start Pradaxa  po bid, 1st dose at 10AM D/C all heparin labs  Marisue Humble, PharmD Clinical Pharmacist [facility]

## 2016-11-18 NOTE — Progress Notes (Signed)
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 He was started on heparin drip. On CTA of the chest was noted to have thyroid nodule which was confirmed by ultrasound.   Assessment & Plan:   Principal Problem:   PE (pulmonary thromboembolism) (HCC) Active Problems:   HLD (hyperlipidemia)   BPH (benign prostatic hyperplasia)   Thyroid nodule  Pulmonary embolism with right lower extremity popliteal deep vein thrombosis start Pradaxa today. 30 day free supply card given.  -Spoke Dr Lavonna Monarch from neuro on 4/10 - ok with [facility] at this time given risk and benefit. If neurological status changes then order repeat CT head.   Right-sided thyroid nodule-5.1 cm -s/p US guided biopsy- shows Benign follicular nodule.   Hyperlipidemia -Continue home medication  BPH Continue Flomax  Slightly O2 desat with ambulation but improved with cues to take deep breaths.   DVT prophylaxis: On Pradaxa starting today.  Code Status: Full Family Communication:  None Disposition Plan: Dsicahrge today   Consultants:   Pulm  Procedures:   Thyroid bx 4/10  Antimicrobials:   None   Subjective: Patient doesn't have any complaints at this time.  Objective: Vitals:   11/17/16 2000 11/18/16 0000 11/18/16 0540 11/18/16 0748  BP: 122/84 117/81 118/71 (!) 128/91  Pulse: 97 87  82  Resp: Temp: 98.1 F (36.7 C) 98.2 F (36.8 C) 98.7 F (37.1 C) 98.7 F (37.1 C)  TempSrc: Oral Oral Oral Oral  SpO2: 95% 90% 94% 90%  Weight:   81.9 kg (180 lb 9.6 oz)   Height:    (1.854 m)     Intake/Output Summary (Last 24 hours) at 11/18/16 0908 Last data filed at 11/18/16 0700  Gross per 24  hour  Intake                0 ml  Output             1850 ml  Net            -1850 ml   Filed Weights   11/16/16 0534 11/17/16 0608 11/18/16 0540  Weight: 84 kg (185 lb 3.2 oz) 81.9 kg (180 lb 9.6 oz) 81.9 kg (180 lb 9.6 oz)    Examination:  General exam: Appears calm and comfortable  Respiratory system: Clear to auscultation. Respiratory effort normal. Cardiovascular system: S1 & S2 heard, RRR. No JVD, murmurs, rubs, gallops or clicks. No pedal edema. Gastrointestinal system: Abdomen is nondistended, soft and nontender. No organomegaly or masses felt. Normal bowel sounds heard. Central nervous system: Alert and oriented. No focal neurological deficits. Extremities: Symmetric 5 x 5 power. Skin: No rashes, lesions or ulcers Psychiatry: Judgement and insight appear normal. Mood & affect appropriate.     Data Reviewed:   CBC:  Recent Labs Lab 11/13/16 0153  11/14/16 0354 11/15/16 0706 11/16/16 0401 11/17/16 0437 11/18/16 0359  WBC 11.4*  --  7.5 8.3 8.5 7.2 8.4  NEUTROABS 8.4*  --   --   --   --   --   --   HGB 16.9  < > 15.0 15.4 15.1 15.6 16.2  HCT 49.7  < > 44.8 44.7 44.9 46.7 47.7  MCV 88.8  --  88.7 88.3 87.9 88.4 88.0  PLT 188  --  179 177 198 209 203  < > = values in this interval not displayed. Basic Metabolic Panel:  Recent Labs Lab 11/13/16 0214 11/13/16 0437 11/15/16 0706  NA 141 140 139  K 3.9 4.2 4.1  CL 110 106 112*  CO2  --  23 21*  GLUCOSE 125* 119* 97  BUN 28* 24* 12  CREATININE 0.90 0.91 0.88  CALCIUM  --  9.4 8.7*   GFR: Estimated Creatinine Clearance: 92.1 mL/min (by C-G formula based on SCr of 0.88 mg/dL). Liver Function Tests:  Recent Labs Lab 11/15/16 0706  AST 21  ALT 22  ALKPHOS 95  BILITOT 0.8  PROT 5.9*  ALBUMIN 3.3*   No results for input(s): LIPASE, AMYLASE in the last 168 hours. No results for input(s): AMMONIA in the last 168 hours. Coagulation Profile: No results for input(s): INR, PROTIME in the last 168  hours. Cardiac Enzymes:  Recent Labs Lab 11/13/16 0437 11/13/16 0857 11/13/16 1319 11/13/16 1950  TROPONINI 0.74* 0.50* 0.44* 0.44*   BNP (last 3 results) No results for input(s): PROBNP in the last 8760 hours. HbA1C: No results for input(s): HGBA1C in the last 72 hours. CBG:  Recent Labs Lab 11/16/16 0719 11/17/16 0738 11/17/16 2050 11/17/16 2352 11/18/16 0746  GLUCAP 98 98 132* 106* 93   Lipid Profile: No results for input(s): CHOL, HDL, LDLCALC, TRIG, CHOLHDL, LDLDIRECT in the last 72 hours. Thyroid Function Tests: No results for input(s): TSH, T4TOTAL, FREET4, T3FREE, THYROIDAB in the last 72 hours. Anemia Panel: No results for input(s): VITAMINB12, FOLATE, FERRITIN, TIBC, IRON, RETICCTPCT in the last 72 hours. Sepsis Labs: No results for input(s): PROCALCITON, LATICACIDVEN in the last 168 hours.  Recent Results (from the past 240 hour(s))  MRSA PCR Screening     Status: None   Collection Time: 11/13/16  1:45 PM  Result Value Ref Range Status   MRSA by PCR NEGATIVE NEGATIVE Final    Comment:        The GeneXpert MRSA Assay (FDA approved for NASAL specimens only), is one component of a comprehensive MRSA colonization surveillance program. It is not intended to diagnose MRSA infection nor to guide or monitor treatment for MRSA infections.          Radiology Studies: No results found.      Scheduled Meds: . dabigatran  150 mg Oral Q12H  . rosuvastatin  10 mg Oral q1800  . sodium chloride flush  3 mL Intravenous Q12H  . tamsulosin  0.4 mg Oral Daily   Continuous Infusions: . sodium chloride 10 mL/hr at 11/16/16 1050  . heparin 1,400 Units/hr (11/17/16 0856)     LOS: 5 days    Time spent: 35 mins    Ankit Joline Maxcy, MD [facility] Pager [phone]   If 7PM-7AM, please contact night-coverage [email or web address] Password [email or web address] 11/18/2016, 9:08 AM

## 2017-04-11 ENCOUNTER — Other Ambulatory Visit: Payer: Self-pay | Admitting: Internal Medicine

## 2017-04-11 DIAGNOSIS — I2699 Other pulmonary embolism without acute cor pulmonale: Secondary | ICD-10-CM

## 2017-04-14 ENCOUNTER — Inpatient Hospital Stay
Admission: RE | Admit: 2017-04-14 | Discharge: 2017-04-14 | Disposition: A | Discharge status: Home / Self Care | Payer: Medicare HMO | Source: Ambulatory Visit | Attending: Internal Medicine | Admitting: Internal Medicine

## 2017-04-21 ENCOUNTER — Other Ambulatory Visit: Payer: Medicare HMO

## 2018-01-08 ENCOUNTER — Emergency Department (HOSPITAL_COMMUNITY): Payer: Medicare HMO

## 2018-01-08 ENCOUNTER — Other Ambulatory Visit: Payer: Self-pay

## 2018-01-08 ENCOUNTER — Inpatient Hospital Stay (HOSPITAL_COMMUNITY)
Admission: EM | Admit: 2018-01-08 | Discharge: 2018-01-18 | DRG: 175 | Disposition: A | Discharge status: Skilled Nursing Facility | Payer: Medicare HMO | Attending: Internal Medicine | Admitting: Internal Medicine

## 2018-01-08 ENCOUNTER — Encounter (HOSPITAL_COMMUNITY): Payer: Self-pay

## 2018-01-08 DIAGNOSIS — I69392 Facial weakness following cerebral infarction: Secondary | ICD-10-CM

## 2018-01-08 DIAGNOSIS — I878 Other specified disorders of veins: Secondary | ICD-10-CM

## 2018-01-08 DIAGNOSIS — Z7902 Long term (current) use of antithrombotics/antiplatelets: Secondary | ICD-10-CM

## 2018-01-08 DIAGNOSIS — I2699 Other pulmonary embolism without acute cor pulmonale: Principal | ICD-10-CM | POA: Diagnosis present

## 2018-01-08 DIAGNOSIS — E872 Acidosis: Secondary | ICD-10-CM | POA: Diagnosis present

## 2018-01-08 DIAGNOSIS — R3915 Urgency of urination: Secondary | ICD-10-CM | POA: Diagnosis present

## 2018-01-08 DIAGNOSIS — Z86711 Personal history of pulmonary embolism: Secondary | ICD-10-CM

## 2018-01-08 DIAGNOSIS — R1013 Epigastric pain: Secondary | ICD-10-CM

## 2018-01-08 DIAGNOSIS — K529 Noninfective gastroenteritis and colitis, unspecified: Secondary | ICD-10-CM | POA: Diagnosis present

## 2018-01-08 DIAGNOSIS — E785 Hyperlipidemia, unspecified: Secondary | ICD-10-CM | POA: Diagnosis present

## 2018-01-08 DIAGNOSIS — I959 Hypotension, unspecified: Secondary | ICD-10-CM | POA: Diagnosis present

## 2018-01-08 DIAGNOSIS — R0902 Hypoxemia: Secondary | ICD-10-CM | POA: Diagnosis present

## 2018-01-08 DIAGNOSIS — N179 Acute kidney failure, unspecified: Secondary | ICD-10-CM | POA: Diagnosis present

## 2018-01-08 DIAGNOSIS — R066 Hiccough: Secondary | ICD-10-CM | POA: Diagnosis present

## 2018-01-08 DIAGNOSIS — K56609 Unspecified intestinal obstruction, unspecified as to partial versus complete obstruction: Secondary | ICD-10-CM

## 2018-01-08 DIAGNOSIS — E7800 Pure hypercholesterolemia, unspecified: Secondary | ICD-10-CM | POA: Diagnosis present

## 2018-01-08 DIAGNOSIS — I69354 Hemiplegia and hemiparesis following cerebral infarction affecting left non-dominant side: Secondary | ICD-10-CM

## 2018-01-08 DIAGNOSIS — R0602 Shortness of breath: Secondary | ICD-10-CM | POA: Diagnosis not present

## 2018-01-08 DIAGNOSIS — Z09 Encounter for follow-up examination after completed treatment for conditions other than malignant neoplasm: Secondary | ICD-10-CM

## 2018-01-08 DIAGNOSIS — N400 Enlarged prostate without lower urinary tract symptoms: Secondary | ICD-10-CM | POA: Diagnosis present

## 2018-01-08 DIAGNOSIS — Z87891 Personal history of nicotine dependence: Secondary | ICD-10-CM

## 2018-01-08 DIAGNOSIS — I82512 Chronic embolism and thrombosis of left femoral vein: Secondary | ICD-10-CM | POA: Diagnosis present

## 2018-01-08 DIAGNOSIS — K567 Ileus, unspecified: Secondary | ICD-10-CM | POA: Diagnosis present

## 2018-01-08 DIAGNOSIS — Z0189 Encounter for other specified special examinations: Secondary | ICD-10-CM

## 2018-01-08 DIAGNOSIS — K920 Hematemesis: Secondary | ICD-10-CM

## 2018-01-08 DIAGNOSIS — E876 Hypokalemia: Secondary | ICD-10-CM | POA: Diagnosis present

## 2018-01-08 DIAGNOSIS — I5033 Acute on chronic diastolic (congestive) heart failure: Secondary | ICD-10-CM | POA: Diagnosis not present

## 2018-01-08 HISTORY — DX: Chronic diastolic (congestive) heart failure: I50.32

## 2018-01-08 LAB — COMPREHENSIVE METABOLIC PANEL
ALT: 12 U/L — AB (ref 17–63)
AST: 18 U/L (ref 15–41)
Albumin: 3.9 g/dL (ref 3.5–5.0)
Alkaline Phosphatase: 52 U/L (ref 38–126)
Anion gap: 7 (ref 5–15)
BILIRUBIN TOTAL: 0.7 mg/dL (ref 0.3–1.2)
BUN: 29 mg/dL — AB (ref 6–20)
CALCIUM: 9.5 mg/dL (ref 8.9–10.3)
CHLORIDE: 107 mmol/L (ref 101–111)
CO2: 27 mmol/L (ref 22–32)
CREATININE: 1.42 mg/dL — AB (ref 0.61–1.24)
GFR, EST AFRICAN AMERICAN: 57 mL/min — AB (ref >60)
GFR, EST NON AFRICAN AMERICAN: 49 mL/min — AB (ref >60)
Glucose, Bld: 145 mg/dL — ABNORMAL HIGH (ref 65–99)
Potassium: 4.5 mmol/L (ref 3.5–5.1)
Sodium: 141 mmol/L (ref 135–145)
Total Protein: 6.7 g/dL (ref 6.5–8.1)

## 2018-01-08 LAB — I-STAT TROPONIN, ED: TROPONIN I, POC: 0 ng/mL (ref 0.00–0.08)

## 2018-01-08 LAB — CBC
HCT: 53.1 % — ABNORMAL HIGH (ref 39.0–52.0)
Hemoglobin: 17.5 g/dL — ABNORMAL HIGH (ref 13.0–17.0)
MCH: 29.5 pg (ref 26.0–34.0)
MCHC: 33 g/dL (ref 30.0–36.0)
MCV: 89.5 fL (ref 78.0–100.0)
PLATELETS: 286 10*3/uL (ref 150–400)
RBC: 5.93 MIL/uL — AB (ref 4.22–5.81)
RDW: 12.9 % (ref 11.5–15.5)
WBC: 7.9 10*3/uL (ref 4.0–10.5)

## 2018-01-08 LAB — I-STAT CG4 LACTIC ACID, ED: LACTIC ACID, VENOUS: 1.92 mmol/L — AB (ref 0.5–1.9)

## 2018-01-08 LAB — LIPASE, BLOOD: LIPASE: 38 U/L (ref 11–51)

## 2018-01-08 MED ORDER — SODIUM CHLORIDE 0.9 % IV SOLN
1000.0000 mL | INTRAVENOUS | Status: DC
  Administered 2018-01-09: 1000 mL via INTRAVENOUS

## 2018-01-08 MED ORDER — SODIUM CHLORIDE 0.9 % IV BOLUS (SEPSIS)
1000.0000 mL | Freq: Once | INTRAVENOUS | Status: AC
  Administered 2018-01-08: 1000 mL via INTRAVENOUS

## 2018-01-08 MED ORDER — IOPAMIDOL (ISOVUE-370) INJECTION 76%
100.0000 mL | Freq: Once | INTRAVENOUS | Status: AC | PRN
  Administered 2018-01-08: 100 mL via INTRAVENOUS

## 2018-01-08 MED ORDER — SODIUM CHLORIDE 0.9 % IV BOLUS
1000.0000 mL | Freq: Once | INTRAVENOUS | Status: AC
Start: 2018-01-08 — End: 2018-01-08
  Administered 2018-01-08: 1000 mL via INTRAVENOUS

## 2018-01-08 MED ORDER — IOPAMIDOL (ISOVUE-370) INJECTION 76%
INTRAVENOUS | Status: AC
  Administered 2018-01-08: 100 mL via INTRAVENOUS
  Filled 2018-01-08: qty 100

## 2018-01-08 NOTE — ED Notes (Signed)
Patient transported to CT 

## 2018-01-08 NOTE — ED Provider Notes (Signed)
[facility] EMERGENCY DEPARTMENT Provider Note   CSN: [id number] Arrival date & time: 01/08/18  2207     History   Chief Complaint Chief Complaint  Patient presents with  . Abdominal Pain    HPI Jason Myers is a 67 y.o. 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  Pt reports he felt poorly after dinner with worsening epigastric abdominal pain from his sternum to his navel.  He reports he attempted to lie down and sleep but the pain got worse.  He reports when he began to sweat he contacted EMS.  Per EMS, patient hypotensive 80/40 on arrival and diaphoretic.  Patient denies pain, shortness of breath, vision changes, vomiting, diarrhea, constipation, weakness, dizziness, syncope.  Patient reports his pain is severe in nature.  He states he very rarely drinks alcohol and occasionally takes naproxen.  He reports never had pain like this before.  Patient reports a history of lipoma excision from his groin but no specific abdominal surgeries.  He denies international travel or recent sick contacts.  Nothing specific seems to make his pain better or worse.  No cough or congestion.  Reports he is supposed to be taking Pradaxa however he has not been on this in more than 1 year.  He reports he never had a follow-up CT scan for his pulmonary embolism.  He is not taking aspirin.  No known abdominal or thoracic aneurysm.  Record review shows that CT abdomen and pelvis in April 2007 was without abdominal aneurysm.  CT scan chest in April 2018 shows no evidence of thoracic aneurysm, liver this was diagnostic for his pulmonary embolism with right heart strain.    The history is provided by the patient and  medical records. No language interpreter was used.    Past Medical History:  Diagnosis Date  . BPH (benign prostatic hyperplasia)   . CVA (cerebral vascular accident) (HCC) 1984   left side affected.    . Hypercholesterolemia     Patient Active Problem List   Diagnosis Date Noted  . PE (pulmonary thromboembolism) (HCC) 11/13/2016  . BPH (benign prostatic hyperplasia) 11/13/2016  . Thyroid nodule 11/13/2016  . HLD (hyperlipidemia) 06/08/2007  . HEMORRHOIDS 06/08/2007    Past Surgical History:  Procedure Laterality Date  . LIPOMA EXCISION Left    groin        Home Medications    Prior to Admission medications   Medication Sig Start Date End Date Taking? Authorizing Provider  rosuvastatin (CRESTOR) 10 MG tablet Take 10 mg by mouth daily. 09/28/16  Yes [provider]  tamsulosin (FLOMAX) 0.4 MG CAPS capsule Take 0.4 mg by mouth at bedtime.  09/28/16  Yes [provider]  dabigatran (PRADAXA) 150 MG CAPS capsule Take 1 capsule (150 mg total) by mouth every 12 (twelve) hours. Patient not taking: Reported on 01/09/2018 11/18/16   Dimple Nanas, MD    Family History Family History  Problem Relation Age of Onset  . Alcoholism Mother   . Kidney Stones Father     Social History Social History   Tobacco Use  . Smoking status: Former Games developer  . Smokeless tobacco: Never Used  Substance Use Topics  . Alcohol use: Yes    Comment: occasionally  .  Drug use: Yes    Types: Marijuana     Allergies   Patient has no known allergies.   Review of Systems Review of Systems  Constitutional: Positive for diaphoresis. Negative for appetite change, fatigue, fever and unexpected weight change.  HENT: Negative for mouth sores.   Eyes: Negative for visual disturbance.  Respiratory: Negative for cough, chest tightness, shortness of breath and wheezing.   Cardiovascular: Negative for chest pain.  Gastrointestinal: Positive for abdominal pain. Negative for  constipation, diarrhea, nausea and vomiting.  Endocrine: Negative for polydipsia, polyphagia and polyuria.  Genitourinary: Negative for dysuria, frequency, hematuria and urgency.  Musculoskeletal: Negative for back pain and neck stiffness.  Skin: Negative for rash.  Allergic/Immunologic: Negative for immunocompromised state.  Neurological: Negative for syncope, light-headedness and headaches.  Hematological: Does not bruise/bleed easily.  Psychiatric/Behavioral: Negative for sleep disturbance. The patient is not nervous/anxious.      Physical Exam Updated Vital Signs BP (!) 80/59   Pulse 79   Resp 19   SpO2 98%   Physical Exam  Constitutional: He appears well-developed and well-nourished. He appears ill. No distress.  Awake, alert,  HENT:  Head: Normocephalic and atraumatic.  Mouth/Throat: Oropharynx is clear and moist. No oropharyngeal exudate.  Eyes: Conjunctivae are normal. No scleral icterus.  Neck: Normal range of motion. Neck supple.  Cardiovascular: Normal rate, regular rhythm and intact distal pulses.  Pulses:      Radial pulses are 2+ on the right side, and 2+ on the left side.       Dorsalis pedis pulses are 1+ on the right side, and 1+ on the left side.  Pulmonary/Chest: Effort normal and breath sounds normal. No respiratory distress. He has no wheezes.  Equal chest expansion Clear and equal breath sounds  Abdominal: Soft. Bowel sounds are normal. He exhibits no mass. There is generalized tenderness. There is guarding. There is no rebound and no CVA tenderness.  Generalized tenderness with minimal guarding throughout  Genitourinary:  Genitourinary Comments: Chaperone present.  DRE with brown stool.  No bright red blood, hemorrhoids, anal fissure or tenderness.  Musculoskeletal: Normal range of motion. He exhibits no edema.  Neurological: He is alert.  Speech is clear and goal oriented Moves extremities without ataxia  Skin: Skin is warm. Capillary refill takes  less than 2 seconds. He is diaphoretic. There is pallor.  Psychiatric: He has a normal mood and affect.  Nursing note and vitals reviewed.    ED Treatments / Results  Labs (all labs ordered are listed, but only abnormal results are displayed) Labs Reviewed  COMPREHENSIVE METABOLIC PANEL - Abnormal; Notable for the following components:      Result Value   Glucose, Bld 145 (*)    BUN 29 (*)    Creatinine, Ser 1.42 (*)    ALT 12 (*)    GFR calc non Af Amer 49 (*)    GFR calc Af Amer 57 (*)    All other components within normal limits  CBC - Abnormal; Notable for the following components:   RBC 5.93 (*)    Hemoglobin 17.5 (*)    HCT 53.1 (*)    All other components within normal limits  I-STAT CG4 LACTIC ACID, ED - Abnormal; Notable for the following components:   Lactic Acid, Venous 1.92 (*)    All other components within normal limits  CULTURE, BLOOD (ROUTINE X 2)  CULTURE, BLOOD (ROUTINE X 2)  LIPASE, BLOOD  URINALYSIS, ROUTINE W REFLEX MICROSCOPIC  HEPARIN LEVEL (  UNFRACTIONATED)  I-STAT TROPONIN, ED  I-STAT CG4 LACTIC ACID, ED  POC OCCULT BLOOD, ED    EKG EKG Interpretation  Date/Time:  Monday January 08 2018 22:24:13 EDT Ventricular Rate:  81 PR Interval:    QRS Duration: 109 QT Interval:  392 QTC Calculation: 455 R Axis:   17 Text Interpretation:  Sinus rhythm Incomplete left bundle branch block Baseline wander in lead(s) II III aVF rate slower than previous Confirmed by Frederick PeersLittle, Rachel [phone]([id number]) on 01/08/2018 10:51:29 PM   Radiology Ct Angio Chest Pe W And/or Wo Contrast  Result Date: 01/09/2018 CLINICAL DATA:  Acute onset of epigastric abdominal pain, radiating to the lower abdomen. Hypotension. Personal history of pulmonary embolus. EXAM: CT ANGIOGRAPHY CHEST CT ABDOMEN AND PELVIS WITH CONTRAST TECHNIQUE: Multidetector CT imaging of the chest was performed using the standard protocol during bolus administration of intravenous contrast. Multiplanar CT image  reconstructions and MIPs were obtained to evaluate the vascular anatomy. Multidetector CT imaging of the abdomen and pelvis was performed using the standard protocol during bolus administration of intravenous contrast. CONTRAST:  100mL ISOVUE-370 IOPAMIDOL (ISOVUE-370) INJECTION 76% COMPARISON:  CTA of the chest performed 11/13/2016, and CT of the chest, abdomen and pelvis performed 11/10/2005 FINDINGS: CTA CHEST FINDINGS Cardiovascular: There is pulmonary embolus within pulmonary arteries to the right lower lobe; this Jason be subacute in nature. The RV/LV ratio is 0.8, within normal limits. The thoracic aorta is grossly unremarkable. The great vessels are within normal limits. The heart is normal in size. Mediastinum/Nodes: The mediastinum is grossly unremarkable. No mediastinal lymphadenopathy is seen. No pericardial effusion is identified. There is asymmetric enlargement of the right thyroid lobe. No axillary lymphadenopathy is seen. Lungs/Pleura: Mild bibasilar airspace opacities likely reflect atelectasis. No pleural effusion or pneumothorax is seen. No masses are identified. Musculoskeletal: No acute osseous abnormalities are identified. The visualized musculature is unremarkable in appearance. Review of the MIP images confirms the above findings. CT ABDOMEN and PELVIS FINDINGS Hepatobiliary: The liver is unremarkable in appearance. The gallbladder is unremarkable in appearance. The common bile duct remains normal in caliber. Pancreas: The pancreas is within normal limits. Spleen: The spleen is unremarkable in appearance. Adrenals/Urinary Tract: The adrenal glands are unremarkable in appearance. Scattered bilateral renal cysts are noted, with a large 10.5 cm cyst at the upper pole of the left kidney. There is no evidence of hydronephrosis. No renal or ureteral stones are identified. No perinephric stranding is seen. Stomach/Bowel: There is a slightly unusual appearance to the antrum of the stomach, without a  definite ulceration. The stomach is otherwise grossly unremarkable. There is minimal haziness about ileal loops within the lower abdomen and pelvis, which could reflect a mild infectious or inflammatory process. Trace free fluid is noted tracking about small bowel loops, along the right paracolic gutter and within the pelvis. The appendix is not visualized; there is no evidence for appendicitis. Mild diverticulosis is noted along the sigmoid colon, without evidence of diverticulitis. Vascular/Lymphatic: Minimal calcification is noted at the distal abdominal aorta. The aorta is otherwise unremarkable. The inferior vena cava is grossly unremarkable. No retroperitoneal lymphadenopathy is seen. No pelvic sidewall lymphadenopathy is identified. Reproductive: The bladder is mildly distended and grossly unremarkable. The prostate is enlarged, measuring 5.4 cm in transverse dimension. Other: No additional soft tissue abnormalities are seen. Musculoskeletal: No acute osseous abnormalities are identified. The intervertebral disc space narrowing is noted at L3-L4. There is grade 1 anterolisthesis of L4 on L5, reflecting underlying facet disease. The visualized musculature is unremarkable  in appearance. Review of the MIP images confirms the above findings. IMPRESSION: 1. Pulmonary embolus within pulmonary arteries to the right lower lobe; this Jason be subacute in nature. RV/LV ratio remains within normal limits. 2. Mild bibasilar airspace opacities likely reflect atelectasis. Lungs otherwise clear. 3. Minimal haziness about ileal loops within the lower abdomen and pelvis, which could reflect a mild infectious or inflammatory process. 4. Scattered bilateral renal cysts, with a large 10.5 cm cyst at the upper pole of the left kidney. 5. Enlarged prostate noted. Critical Value/emergent results were called by telephone at the time of interpretation on 01/09/2018 at 12:48 am to [facility], who verbally acknowledged these  results. Electronically Signed   By: Roanna Raider M.D.   On: 01/09/2018 00:50   Ct Abdomen Pelvis W Contrast  Result Date: 01/09/2018 CLINICAL DATA:  Acute onset of epigastric abdominal pain, radiating to the lower abdomen. Hypotension. Personal history of pulmonary embolus. EXAM: CT ANGIOGRAPHY CHEST CT ABDOMEN AND PELVIS WITH CONTRAST TECHNIQUE: Multidetector CT imaging of the chest was performed using the standard protocol during bolus administration of intravenous contrast. Multiplanar CT image reconstructions and MIPs were obtained to evaluate the vascular anatomy. Multidetector CT imaging of the abdomen and pelvis was performed using the standard protocol during bolus administration of intravenous contrast. CONTRAST:  ISOVUE-370 IOPAMIDOL (ISOVUE-370) INJECTION 76% COMPARISON:  CTA of the chest performed 11/13/2016, and CT of the chest, abdomen and pelvis performed 11/10/2005 FINDINGS: CTA CHEST FINDINGS Cardiovascular: There is pulmonary embolus within pulmonary arteries to the right lower lobe; this Jason be subacute in nature. The RV/LV ratio is 0.8, within normal limits. The thoracic aorta is grossly unremarkable. The great vessels are within normal limits. The heart is normal in size. Mediastinum/Nodes: The mediastinum is grossly unremarkable. No mediastinal lymphadenopathy is seen. No pericardial effusion is identified. There is asymmetric enlargement of the right thyroid lobe. No axillary lymphadenopathy is seen. Lungs/Pleura: Mild bibasilar airspace opacities likely reflect atelectasis. No pleural effusion or pneumothorax is seen. No masses are identified. Musculoskeletal: No acute osseous abnormalities are identified. The visualized musculature is unremarkable in appearance. Review of the MIP images confirms the above findings. CT ABDOMEN and PELVIS FINDINGS Hepatobiliary: The liver is unremarkable in appearance. The gallbladder is unremarkable in appearance. The common bile duct remains  normal in caliber. Pancreas: The pancreas is within normal limits. Spleen: The spleen is unremarkable in appearance. Adrenals/Urinary Tract: The adrenal glands are unremarkable in appearance. Scattered bilateral renal cysts are noted, with a large 10.5 cm cyst at the upper pole of the left kidney. There is no evidence of hydronephrosis. No renal or ureteral stones are identified. No perinephric stranding is seen. Stomach/Bowel: There is a slightly unusual appearance to the antrum of the stomach, without a definite ulceration. The stomach is otherwise grossly unremarkable. There is minimal haziness about ileal loops within the lower abdomen and pelvis, which could reflect a mild infectious or inflammatory process. Trace free fluid is noted tracking about small bowel loops, along the right paracolic gutter and within the pelvis. The appendix is not visualized; there is no evidence for appendicitis. Mild diverticulosis is noted along the sigmoid colon, without evidence of diverticulitis. Vascular/Lymphatic: Minimal calcification is noted at the distal abdominal aorta. The aorta is otherwise unremarkable. The inferior vena cava is grossly unremarkable. No retroperitoneal lymphadenopathy is seen. No pelvic sidewall lymphadenopathy is identified. Reproductive: The bladder is mildly distended and grossly unremarkable. The prostate is enlarged, measuring 5.4 cm in transverse dimension. Other:  No additional soft tissue abnormalities are seen. Musculoskeletal: No acute osseous abnormalities are identified. The intervertebral disc space narrowing is noted at L3-L4. There is grade 1 anterolisthesis of L4 on L5, reflecting underlying facet disease. The visualized musculature is unremarkable in appearance. Review of the MIP images confirms the above findings. IMPRESSION: 1. Pulmonary embolus within pulmonary arteries to the right lower lobe; this Jason be subacute in nature. RV/LV ratio remains within normal limits. 2. Mild  bibasilar airspace opacities likely reflect atelectasis. Lungs otherwise clear. 3. Minimal haziness about ileal loops within the lower abdomen and pelvis, which could reflect a mild infectious or inflammatory process. 4. Scattered bilateral renal cysts, with a large 10.5 cm cyst at the upper pole of the left kidney. 5. Enlarged prostate noted. Critical Value/emergent results were called by telephone at the time of interpretation on 01/09/2018 at 12:48 am to [facility], who verbally acknowledged these results. Electronically Signed   By: Roanna Raider M.D.   On: 01/09/2018 00:50    Dg Chest Portable 1 View  Result Date: 01/08/2018 CLINICAL DATA:  Epigastric abdominal pain.  Hypotension. EXAM: PORTABLE CHEST 1 VIEW COMPARISON:  Radiographs and CT 11/13/2016 FINDINGS: Low lung volumes with bibasilar atelectasis. Mild cardiomegaly likely accentuated by low lung volumes. No confluent airspace disease, pleural effusion or pneumothorax. No acute osseous abnormalities. IMPRESSION: Low lung volumes with bibasilar atelectasis. Electronically Signed   By: Rubye Oaks M.D.   On: 01/08/2018 23:39   Dg Abd Portable 2 Views  Result Date: 01/08/2018 CLINICAL DATA:  Epigastric abdominal pain.  Hypotension. EXAM: PORTABLE ABDOMEN - 2 VIEW COMPARISON:  None. FINDINGS: No free intra-abdominal air. Prominent air-filled small bowel loops in the left upper quadrant measure up to 3.3 cm. Moderate stool in the ascending and proximal transverse colon, minimal stool distally. No abnormal rectal distention. No radiopaque calculi. Bibasilar atelectasis. Scoliotic curvature in degenerative change in the spine. IMPRESSION: Prominent air-filled left upper quadrant bowel loops can be seen with enteritis, ileus, or less likely early obstruction. No free air. Electronically Signed   By: Rubye Oaks M.D.   On: 01/08/2018 23:38    Procedures .Critical Care Performed by: Dierdre Forth, PA-C Authorized by:  Dierdre Forth, PA-C   Critical care provider statement:    Critical care time (minutes):  45   Critical care time was exclusive of:  Separately billable procedures and treating other patients and teaching time   Critical care was necessary to treat or prevent imminent or life-threatening deterioration of the following conditions:  Circulatory failure, respiratory failure and shock   Critical care was time spent personally by me on the following activities:  Development of treatment plan with patient or surrogate, discussions with consultants, evaluation of patient's response to treatment, examination of patient, obtaining history from patient or surrogate, ordering and performing treatments and interventions, ordering and review of laboratory studies, ordering and review of radiographic studies, pulse oximetry, re-evaluation of patient's condition and review of old charts   I assumed direction of critical care for this patient from another provider in my specialty: no     (including critical care time)  Medications Ordered in ED Medications  sodium chloride 0.9 % bolus 1,000 mL (0 mLs Intravenous Stopped 01/08/18 2340)    Followed by  0.9 %  sodium chloride infusion (1,000 mLs Intravenous New Bag/Given 01/09/18 0020)  heparin bolus via infusion 5,000 Units (has no administration in time range)  heparin ADULT infusion 100 units/mL (25000 units/217mL sodium chloride 0.45%) (has  no administration in time range)  piperacillin-tazobactam (ZOSYN) IVPB 3.375 g (has no administration in time range)  fentaNYL (SUBLIMAZE) injection 50 mcg (has no administration in time range)  piperacillin-tazobactam (ZOSYN) IVPB 3.375 g (has no administration in time range)  sodium chloride 0.9 % bolus 1,000 mL (0 mLs Intravenous Stopped 01/08/18 2303)  iopamidol (ISOVUE-370) 76 % injection (100 mLs Intravenous Contrast Given 01/08/18 2315)  iopamidol (ISOVUE-370) 76 % injection 100 mL (100 mLs Intravenous Contrast  Given 01/08/18 2354)  fentaNYL (SUBLIMAZE) injection 50 mcg (50 mcg Intravenous Given 01/09/18 0021)     Initial Impression / Assessment and Plan / ED Course  I have reviewed the triage vital signs and the nursing notes.  Pertinent labs & imaging results that were available during my care of the patient were reviewed by me and considered in my medical decision making (see chart for details).  Clinical Course as of Jan 09 199  Mon Jan 08, 2018  2242 Neg  Troponin i, poc: 0.00 [HM]  2242 WNL  WBC: 7.9 [HM]  2243 hypotensive  BP(!): 80/59 [HM]  2243 No tachycardia  Pulse Rate: 79 [HM]  Tue Jan 09, 2018  0049 Discussed with radiology - pt with RLL PE and mild inflammation about the ilium.     [HM]  0117 Patient with worsening tachypnea and now with hypoxia 88% on room air.  Will place on 2 L via nasal cannula.   [HM]  0118 Patient reports persistent abdominal pain and states he is having difficulty urinating.  No abdominal distention on repeat exam.  He is generally tender.  No rebound or guarding.   [HM]  0119 Blood pressure has improved.  BP(!): 110/92 [HM]  0137 CT of the chest shows pulmonary embolus within the pulmonary arteries in the right lower lobe unclear if this is acute or subacute.  There is no right heart strain noted.  I personally evaluated these images.  CT Angio Chest PE W and/or Wo Contrast [HM]  0141 CT of the abdomen shows inflammation of the ileal loops within the lower abdomen and a small amount of free fluid surrounding this.  Personally evaluated these images.  CT ABDOMEN PELVIS W CONTRAST [HM]  0150 Discussed with Dr. Katrinka Blazing who will admit   [HM]  [phone] Patient was able to urinate without difficulty.   [HM]  0157 Oxygen saturations improved after placement of nasal cannula  SpO2: 92 % [HM]    Clinical Course User Index [HM] Shelly Spenser, Dahlia Client, PA-C    Patient presents with hypertension, abdominal pain and diaphoresis.  He is generally ill appearing.   Abdomen with generalized tenderness and mild guarding but no rebound.  No CVA tenderness.  Patient was slightly elevated lactic acid which improved after fluids.  Patient's blood pressure improved after fluids.  CT scan of his chest shows right lower lobe pulmonary embolism potentially subacute without right heart strain.  CT of his abdomen shows ileitis.  I personally evaluated these images.  Patient denies rectal bleeding.  Rectal exam with brown stool.  No global ischemia of his bowels to suggest mesenteric ischemia.  Patient officially afebrile however feels hot to touch and has rectal temperature of 100.0.  Zosyn ordered.  Blood cultures will be obtained.  Patient returns from CT scan with hypoxia at 88% and tachypnea.  Patient denies chest pain or shortness of breath.  Patient placed on nasal cannula at 3 L/min with improvement.  Heparin initiated for pulmonary embolism.  Patient's blood pressure remains improved.  He will be admitted to the hospitalist service for further evaluation and treatment.  The patient was discussed with and seen by Dr. Eudelia Bunch who agrees with the treatment plan.   Final Clinical Impressions(s) / ED Diagnoses   Final diagnoses:  Epigastric pain  Pulmonary embolism without acute cor pulmonale, unspecified chronicity, unspecified pulmonary embolism type [facility])  Ileitis    ED Discharge Orders    None       Milta Deiters 01/09/18 0200    Nira Conn, MD 01/10/18 2351

## 2018-01-08 NOTE — ED Triage Notes (Signed)
Patient from home complaining of epigastric abdominal pain that goes into the lower stomach. Hx of PE and Stroke.  Said he was clammy prior to calling EMS.  A&Ox4 at this time.  Hypotensive for EMS. No fluids given PTA.

## 2018-01-09 ENCOUNTER — Emergency Department (HOSPITAL_COMMUNITY): Payer: Medicare HMO

## 2018-01-09 ENCOUNTER — Inpatient Hospital Stay (HOSPITAL_COMMUNITY): Payer: Medicare HMO

## 2018-01-09 DIAGNOSIS — I959 Hypotension, unspecified: Secondary | ICD-10-CM | POA: Diagnosis present

## 2018-01-09 DIAGNOSIS — R1013 Epigastric pain: Secondary | ICD-10-CM

## 2018-01-09 DIAGNOSIS — I69392 Facial weakness following cerebral infarction: Secondary | ICD-10-CM | POA: Diagnosis not present

## 2018-01-09 DIAGNOSIS — I5032 Chronic diastolic (congestive) heart failure: Secondary | ICD-10-CM | POA: Diagnosis not present

## 2018-01-09 DIAGNOSIS — K920 Hematemesis: Secondary | ICD-10-CM | POA: Diagnosis not present

## 2018-01-09 DIAGNOSIS — N179 Acute kidney failure, unspecified: Secondary | ICD-10-CM | POA: Diagnosis not present

## 2018-01-09 DIAGNOSIS — R3915 Urgency of urination: Secondary | ICD-10-CM | POA: Diagnosis present

## 2018-01-09 DIAGNOSIS — R0902 Hypoxemia: Secondary | ICD-10-CM

## 2018-01-09 DIAGNOSIS — K567 Ileus, unspecified: Secondary | ICD-10-CM | POA: Diagnosis present

## 2018-01-09 DIAGNOSIS — R933 Abnormal findings on diagnostic imaging of other parts of digestive tract: Secondary | ICD-10-CM | POA: Diagnosis not present

## 2018-01-09 DIAGNOSIS — I2699 Other pulmonary embolism without acute cor pulmonale: Secondary | ICD-10-CM | POA: Diagnosis not present

## 2018-01-09 DIAGNOSIS — K922 Gastrointestinal hemorrhage, unspecified: Secondary | ICD-10-CM | POA: Diagnosis not present

## 2018-01-09 DIAGNOSIS — N400 Enlarged prostate without lower urinary tract symptoms: Secondary | ICD-10-CM | POA: Diagnosis present

## 2018-01-09 DIAGNOSIS — K56609 Unspecified intestinal obstruction, unspecified as to partial versus complete obstruction: Secondary | ICD-10-CM | POA: Diagnosis not present

## 2018-01-09 DIAGNOSIS — E785 Hyperlipidemia, unspecified: Secondary | ICD-10-CM | POA: Diagnosis present

## 2018-01-09 DIAGNOSIS — I69354 Hemiplegia and hemiparesis following cerebral infarction affecting left non-dominant side: Secondary | ICD-10-CM | POA: Diagnosis not present

## 2018-01-09 DIAGNOSIS — E872 Acidosis: Secondary | ICD-10-CM | POA: Diagnosis present

## 2018-01-09 DIAGNOSIS — I361 Nonrheumatic tricuspid (valve) insufficiency: Secondary | ICD-10-CM | POA: Diagnosis not present

## 2018-01-09 DIAGNOSIS — R066 Hiccough: Secondary | ICD-10-CM | POA: Diagnosis present

## 2018-01-09 DIAGNOSIS — Z86711 Personal history of pulmonary embolism: Secondary | ICD-10-CM | POA: Diagnosis not present

## 2018-01-09 DIAGNOSIS — I82512 Chronic embolism and thrombosis of left femoral vein: Secondary | ICD-10-CM | POA: Diagnosis present

## 2018-01-09 DIAGNOSIS — Z7902 Long term (current) use of antithrombotics/antiplatelets: Secondary | ICD-10-CM | POA: Diagnosis not present

## 2018-01-09 DIAGNOSIS — Z87891 Personal history of nicotine dependence: Secondary | ICD-10-CM | POA: Diagnosis not present

## 2018-01-09 DIAGNOSIS — E7800 Pure hypercholesterolemia, unspecified: Secondary | ICD-10-CM | POA: Diagnosis present

## 2018-01-09 DIAGNOSIS — R0602 Shortness of breath: Secondary | ICD-10-CM | POA: Diagnosis present

## 2018-01-09 DIAGNOSIS — I5033 Acute on chronic diastolic (congestive) heart failure: Secondary | ICD-10-CM | POA: Diagnosis not present

## 2018-01-09 DIAGNOSIS — K529 Noninfective gastroenteritis and colitis, unspecified: Secondary | ICD-10-CM | POA: Diagnosis not present

## 2018-01-09 DIAGNOSIS — E876 Hypokalemia: Secondary | ICD-10-CM | POA: Diagnosis not present

## 2018-01-09 LAB — CBC
HEMATOCRIT: 51.9 % (ref 39.0–52.0)
HEMOGLOBIN: 17.1 g/dL — AB (ref 13.0–17.0)
MCH: 29.5 pg (ref 26.0–34.0)
MCHC: 32.9 g/dL (ref 30.0–36.0)
MCV: 89.5 fL (ref 78.0–100.0)
Platelets: 226 10*3/uL (ref 150–400)
RBC: 5.8 MIL/uL (ref 4.22–5.81)
RDW: 13.1 % (ref 11.5–15.5)
WBC: 9.9 10*3/uL (ref 4.0–10.5)

## 2018-01-09 LAB — PROTIME-INR
INR: 1.06
Prothrombin Time: 13.8 seconds (ref 11.4–15.2)

## 2018-01-09 LAB — BASIC METABOLIC PANEL
ANION GAP: 9 (ref 5–15)
BUN: 28 mg/dL — ABNORMAL HIGH (ref 6–20)
CO2: 20 mmol/L — AB (ref 22–32)
Calcium: 8.8 mg/dL — ABNORMAL LOW (ref 8.9–10.3)
Chloride: 112 mmol/L — ABNORMAL HIGH (ref 101–111)
Creatinine, Ser: 1.13 mg/dL (ref 0.61–1.24)
GFR calc Af Amer: >60 mL/min (ref >60)
GFR calc non Af Amer: >60 mL/min (ref >60)
GLUCOSE: 138 mg/dL — AB (ref 65–99)
POTASSIUM: 4.3 mmol/L (ref 3.5–5.1)
Sodium: 141 mmol/L (ref 135–145)

## 2018-01-09 LAB — HEPARIN LEVEL (UNFRACTIONATED)
Heparin Unfractionated: 0.78 IU/mL — ABNORMAL HIGH (ref 0.30–0.70)
Heparin Unfractionated: 0.59 IU/mL (ref 0.30–0.70)
Heparin Unfractionated: 0.4 IU/mL (ref 0.30–0.70)

## 2018-01-09 LAB — URINALYSIS, ROUTINE W REFLEX MICROSCOPIC
Bilirubin Urine: NEGATIVE
GLUCOSE, UA: NEGATIVE mg/dL
HGB URINE DIPSTICK: NEGATIVE
Ketones, ur: 5 mg/dL — AB
Leukocytes, UA: NEGATIVE
Nitrite: NEGATIVE
PH: 5 (ref 5.0–8.0)
Protein, ur: NEGATIVE mg/dL

## 2018-01-09 LAB — I-STAT CG4 LACTIC ACID, ED: Lactic Acid, Venous: 1.67 mmol/L (ref 0.5–1.9)

## 2018-01-09 LAB — POC OCCULT BLOOD, ED: FECAL OCCULT BLD: NEGATIVE

## 2018-01-09 LAB — HIV ANTIBODY (ROUTINE TESTING W REFLEX): HIV Screen 4th Generation wRfx: NONREACTIVE

## 2018-01-09 MED ORDER — ALBUTEROL SULFATE (2.5 MG/3ML) 0.083% IN NEBU: 2.5000 mg | INHALATION_SOLUTION | RESPIRATORY_TRACT | Status: DC | PRN

## 2018-01-09 MED ORDER — FENTANYL CITRATE (PF) 100 MCG/2ML IJ SOLN
50.0000 ug | Freq: Once | INTRAMUSCULAR | Status: AC
Start: 2018-01-09 — End: 2018-01-09
  Administered 2018-01-09: 50 ug via INTRAVENOUS
  Filled 2018-01-09: qty 2

## 2018-01-09 MED ORDER — ONDANSETRON HCL 4 MG/2ML IJ SOLN: 4.0000 mg | Freq: Four times a day (QID) | INTRAMUSCULAR | Status: DC | PRN

## 2018-01-09 MED ORDER — ZOLPIDEM TARTRATE 5 MG PO TABS
5.0000 mg | ORAL_TABLET | Freq: Every evening | ORAL | Status: DC | PRN
  Administered 2018-01-09 – 2018-01-17 (×7): 5 mg via ORAL
  Filled 2018-01-09 (×7): qty 1

## 2018-01-09 MED ORDER — SODIUM CHLORIDE 0.9 % IV SOLN
1000.0000 mL | INTRAVENOUS | Status: DC
  Administered 2018-01-09 – 2018-01-12 (×6): 1000 mL via INTRAVENOUS

## 2018-01-09 MED ORDER — ACETAMINOPHEN 650 MG RE SUPP: 650.0000 mg | Freq: Four times a day (QID) | RECTAL | Status: DC | PRN

## 2018-01-09 MED ORDER — MORPHINE SULFATE (PF) 2 MG/ML IV SOLN
1.0000 mg | INTRAVENOUS | Status: DC | PRN
  Administered 2018-01-09 – 2018-01-10 (×2): 2 mg via INTRAVENOUS
  Filled 2018-01-09 (×2): qty 1

## 2018-01-09 MED ORDER — PIPERACILLIN-TAZOBACTAM 3.375 G IVPB 30 MIN
3.3750 g | Freq: Once | INTRAVENOUS | Status: AC
  Administered 2018-01-09: 3.375 g via INTRAVENOUS
  Filled 2018-01-09: qty 50

## 2018-01-09 MED ORDER — PIPERACILLIN-TAZOBACTAM 3.375 G IVPB
3.3750 g | Freq: Three times a day (TID) | INTRAVENOUS | Status: DC
  Administered 2018-01-09 – 2018-01-15 (×18): 3.375 g via INTRAVENOUS
  Filled 2018-01-09 (×20): qty 50

## 2018-01-09 MED ORDER — FENTANYL CITRATE (PF) 100 MCG/2ML IJ SOLN
50.0000 ug | Freq: Once | INTRAMUSCULAR | Status: AC
  Administered 2018-01-09: 50 ug via INTRAVENOUS
  Filled 2018-01-09: qty 2

## 2018-01-09 MED ORDER — HEPARIN BOLUS VIA INFUSION
5000.0000 [IU] | Freq: Once | INTRAVENOUS | Status: AC
  Administered 2018-01-09: 5000 [IU] via INTRAVENOUS
  Filled 2018-01-09: qty 5000

## 2018-01-09 MED ORDER — HEPARIN (PORCINE) IN NACL 100-0.45 UNIT/ML-% IJ SOLN
1400.0000 [IU]/h | INTRAMUSCULAR | Status: DC
  Administered 2018-01-09 (×2): 1250 [IU]/h via INTRAVENOUS
  Filled 2018-01-09 (×2): qty 250

## 2018-01-09 MED ORDER — ROSUVASTATIN CALCIUM 10 MG PO TABS
10.0000 mg | ORAL_TABLET | Freq: Every day | ORAL | Status: DC
  Administered 2018-01-09 – 2018-01-18 (×4): 10 mg via ORAL
  Filled 2018-01-09 (×5): qty 1

## 2018-01-09 MED ORDER — ONDANSETRON HCL 4 MG PO TABS
4.0000 mg | ORAL_TABLET | Freq: Four times a day (QID) | ORAL | Status: DC | PRN
Start: 2018-01-09 — End: 2018-01-18

## 2018-01-09 MED ORDER — ACETAMINOPHEN 325 MG PO TABS
650.0000 mg | ORAL_TABLET | Freq: Four times a day (QID) | ORAL | Status: DC | PRN
  Administered 2018-01-09 – 2018-01-10 (×2): 650 mg via ORAL
  Filled 2018-01-09 (×2): qty 2

## 2018-01-09 MED ORDER — TAMSULOSIN HCL 0.4 MG PO CAPS
0.4000 mg | ORAL_CAPSULE | Freq: Every day | ORAL | Status: DC
  Administered 2018-01-09 – 2018-01-17 (×8): 0.4 mg via ORAL
  Filled 2018-01-09 (×8): qty 1

## 2018-01-09 NOTE — Progress Notes (Signed)
ANTICOAGULATION CONSULT NOTE - Follow-Up  Pharmacy Consult for Heparin Indication: pulmonary embolus  No Known Allergies  Patient Measurements: Height: 6\' 1"  (185.4 cm) Weight: 184 lb (83.5 kg) IBW/kg (Calculated) : 79.9  Vital Signs: Temp: 98.3 F (36.8 C) (06/04 1608) Temp Source: Oral (06/04 1608) BP: 127/94 (06/04 1608) Pulse Rate: 98 (06/04 1608)  Labs: Recent Labs    01/08/18 2205 01/09/18 0221 01/09/18 0755 01/09/18 1545 01/09/18 2252  HGB 17.5* 17.1*  --   --   --   HCT 53.1* 51.9  --   --   --   PLT 286 226  --   --   --   LABPROT  --  13.8  --   --   --   INR  --  1.06  --   --   --   HEPARINUNFRC  --   --  0.78* 0.59 0.40  CREATININE 1.42* 1.13  --   --   --     Estimated Creatinine Clearance: 70.7 mL/min (by C-G formula based on SCr of 1.13 mg/dL).   Medical History: Past Medical History:  Diagnosis Date  . BPH (benign prostatic hyperplasia)   . CVA (cerebral vascular accident) (HCC) 1984   left side affected.    . Hypercholesterolemia     Assessment: 67yo male c/o abdominal pain, CT reveals PE (possibly subacute) to begin heparin; of note, pt had PE in April 2018 tx'd w/ Pradaxa, which has since been d/c'd.  Heparin level this morning is SUPRAtherapeutic (HL 0.78, goal of 0.3-0.7). This may still be reflective of the bolus given with initiation, CBC stable, no bleeding noted.   6/4 PM update: heparin level therapeutic x 2  Goal of Therapy:  Heparin level 0.3-0.7 units/ml Monitor platelets by anticoagulation protocol: Yes   Plan:  Cont heparin at 1250 units/hr Daily CBC/HL  Abran DukeJames Barrett Holthaus, PharmD, BCPS Clinical Pharmacist Phone: [phone]

## 2018-01-09 NOTE — Progress Notes (Signed)
Jason Myers is a 67 y.o. male with medical history significant of hemorrhagic stroke s/p ruptured aneurysm in 1984, history of DVT/PE, HLD, and BPH; who presents with complaints of generalized abdominal pain was found to have enteritis , was started on zosyn. He was also found to have acute PE/ DVT AND was started on IV heparin.   Edson SnowballVijaya Drishti Pepperman,MD [phone]

## 2018-01-09 NOTE — Progress Notes (Signed)
Pharmacy Antibiotic Note  Jason Myers is a 67 y.o. male admitted on 01/08/2018 with intra abdominal infection.  Pharmacy has been consulted for zosyn dosing.  Plan: Zosyn 3.375g IV q8h (4 hour infusion). F/u cultures and clinical course Height: 6\' 1"  (185.4 cm) Weight: 184 lb (83.5 kg) IBW/kg (Calculated) : 79.9  Temp (24hrs), Avg:99.6 F (37.6 C), Min:99.2 F (37.3 C), Max:100 F (37.8 C)  Recent Labs  Lab 01/08/18 2205 01/08/18 2252 01/09/18 0037  WBC 7.9  --   --   CREATININE 1.42*  --   --   LATICACIDVEN  --  1.92* 1.67    Estimated Creatinine Clearance: 56.3 mL/min (A) (by C-G formula based on SCr of 1.42 mg/dL (H)).    No Known Allergies   Thank you for allowing pharmacy to be a part of this patient's care.  Talbert CageSeay, Abrish Erny Poteet 01/09/2018 1:44 AM

## 2018-01-09 NOTE — Progress Notes (Signed)
Bilateral lower extremity venous duplex has been completed. There is evidence of chronic deep vein thrombosis involving the distal portion of the femoral vein of the left lower extremity. Negative for DVT on the right. Results were given to the patient's nurse, Marcelino DusterMichelle.  01/09/18 2:25 PM Olen CordialGreg Axle Parfait RVT

## 2018-01-09 NOTE — Progress Notes (Signed)
Called from vascular lab and informed that patient doppler ultrasound study showed "chronich DVT in left femoral vein."  MD text paged and made aware of findings.

## 2018-01-09 NOTE — Progress Notes (Signed)
ANTICOAGULATION CONSULT NOTE - Follow-Up  Pharmacy Consult for Heparin Indication: pulmonary embolus  No Known Allergies  Patient Measurements: Height: 6\' 1"  (185.4 cm) Weight: 184 lb (83.5 kg) IBW/kg (Calculated) : 79.9  Vital Signs: Temp: 98.8 F (37.1 C) (06/04 0819) Temp Source: Oral (06/04 0819) BP: 146/106 (06/04 0819) Pulse Rate: 97 (06/04 0819)  Labs: Recent Labs    01/08/18 2205 01/09/18 0221 01/09/18 0755  HGB 17.5* 17.1*  --   HCT 53.1* 51.9  --   PLT 286 226  --   LABPROT  --  13.8  --   INR  --  1.06  --   HEPARINUNFRC  --   --  0.78*  CREATININE 1.42* 1.13  --     Estimated Creatinine Clearance: 70.7 mL/min (by C-G formula based on SCr of 1.13 mg/dL).   Medical History: Past Medical History:  Diagnosis Date  . BPH (benign prostatic hyperplasia)   . CVA (cerebral vascular accident) (HCC) 1984   left side affected.    . Hypercholesterolemia     Assessment: 67yo male c/o abdominal pain, CT reveals PE (possibly subacute) to begin heparin; of note, pt had PE in April 2018 tx'd w/ Pradaxa, which has since been d/c'd.  Heparin level this morning is SUPRAtherapeutic (HL 0.78, goal of 0.3-0.7). This may still be reflective of the bolus given with initiation, CBC stable, no bleeding noted.   Goal of Therapy:  Heparin level 0.3-0.7 units/ml Monitor platelets by anticoagulation protocol: Yes   Plan:  - Reduce Heparin to 1250 units/hr (12.5 ml/hr) - Will continue to monitor for any signs/symptoms of bleeding and will follow up with heparin level in 6 hours   Thank you for allowing pharmacy to be a part of this patient's care.  Georgina PillionElizabeth Avangeline Stockburger, PharmD, BCPS Clinical Pharmacist Pager: [phone] Clinical phone for 01/09/2018 from 7a-3:30p: [phone] If after 3:30p, please call main pharmacy at: [phone] 01/09/2018 8:55 AM

## 2018-01-09 NOTE — ED Notes (Signed)
Urinal given to pt and instructed him that a urine sample is needed

## 2018-01-09 NOTE — ED Notes (Signed)
Report given to Nate

## 2018-01-09 NOTE — Progress Notes (Signed)
ANTICOAGULATION CONSULT NOTE - Follow-Up  Pharmacy Consult for Heparin Indication: pulmonary embolus  No Known Allergies  Patient Measurements: Height: 6\' 1"  (185.4 cm) Weight: 184 lb (83.5 kg) IBW/kg (Calculated) : 79.9  Vital Signs: Temp: 98.3 F (36.8 C) (06/04 1608) Temp Source: Oral (06/04 1608) BP: 127/94 (06/04 1608) Pulse Rate: 98 (06/04 1608)  Labs: Recent Labs    01/08/18 2205 01/09/18 0221 01/09/18 0755 01/09/18 1545  HGB 17.5* 17.1*  --   --   HCT 53.1* 51.9  --   --   PLT 286 226  --   --   LABPROT  --  13.8  --   --   INR  --  1.06  --   --   HEPARINUNFRC  --   --  0.78* 0.59  CREATININE 1.42* 1.13  --   --     Estimated Creatinine Clearance: 70.7 mL/min (by C-G formula based on SCr of 1.13 mg/dL).   Medical History: Past Medical History:  Diagnosis Date  . BPH (benign prostatic hyperplasia)   . CVA (cerebral vascular accident) (HCC) 1984   left side affected.    . Hypercholesterolemia     Assessment: 67yo male c/o abdominal pain, CT reveals PE (possibly subacute) to begin heparin; of note, pt had PE in April 2018 tx'd w/ Pradaxa, which has since been d/c'd.  Heparin level this morning is SUPRAtherapeutic (HL 0.78, goal of 0.3-0.7). This may still be reflective of the bolus given with initiation, CBC stable, no bleeding noted.   Goal of Therapy:  Heparin level 0.3-0.7 units/ml Monitor platelets by anticoagulation protocol: Yes   Plan:  - Continue Heparin to 1250 units/hr (12.5 ml/hr) - Confirm heparin level in 6 hrs. -Daily heparin level and CBC  Thank you for allowing pharmacy to be a part of this patient's care.  Jenetta DownerJessica Cidney Kirkwood, Pharm D, BCPS, [facility] Clinical Pharmacist Pager [phone]  01/09/2018 5:15 PM   01/09/2018 5:15 PM

## 2018-01-09 NOTE — ED Notes (Signed)
Pt placed on 3L [place]

## 2018-01-09 NOTE — H&P (Addendum)
History and Physical    Jason Myers ZOX:[id number] DOB: Oct 24, 1950 DOA: 01/08/2018  Referring MD/NP/PA: Oliver Barre, PA-C PCP: [facility]  Patient [place]: [facility]  Chief Complaint: Abdominal pain  I have personally briefly reviewed patient's old medical records in [facility]   HPI: Jason Myers is a 67 y.o. male with medical history significant of hemorrhagic stroke s/p ruptured aneurysm in 1984, history of DVT/PE, HLD, and BPH; who presents with complaints of generalized abdominal pain starting acutely 7 PM last night.  He tried laying down without relief of symptoms.  Taking a deep breath or testing his abdominal muscles made symptoms worse.  Associated symptoms including patient complaining of feeling hot and clammy.  Patient denies having any noted fever, nausea, vomiting, diarrhea, chest pain, shortness of breath, cough, palpitations, calf pain, recent travel, or loss of consciousness.  He admits to occasional use of alcohol but not on a daily basis.  In 11/2016, he was diagnosed with pulmonary embolus and DVT for which he was started on Pradaxa in the hospital.  However, after discharge patient reports being on the Pradaxa approximately 1 month and there was some issue with insurance for which a repeat CT scan of his chest was denied, and he reports never being continued on the Pradaxa.   ED Course: Upon admission to the emergency department patient was noted to have a temperature up to 100 F, pulse 79-88, respirations 19-30, blood pressure 80/59-110/92, and O2 saturations 88% 100% after being placed on 3 L nasal cannula oxygen.  Labs revealed WBC 7.9, hemoglobin 17.5, BUN 29, creatinine 1.42, lactic acid 1.92, and troponin 0.  CT angiogram reveals su right-sided pulmonary embolus with signs of possible ileitis, bilateral renal cysts, and enlarged prostate.  Patient had been given 2 L of normal saline IV fluids, cultures obtained, fentanyl IV for  pain, Zosyn, and started on heparin drip per pharmacy.  Review of Systems  Constitutional: Positive for diaphoresis and malaise/fatigue. Negative for chills and fever.  HENT: Negative for congestion and nosebleeds.   Eyes: Negative for double vision and pain.  Respiratory: Negative for cough and shortness of breath.   Cardiovascular: Negative for chest pain and leg swelling.  Gastrointestinal: Positive for abdominal pain. Negative for nausea and vomiting.  Genitourinary: Negative for dysuria and hematuria.  Musculoskeletal: Negative for back pain and falls.  Neurological: Negative for loss of consciousness and weakness.  Psychiatric/Behavioral: Negative for substance abuse. The patient is not nervous/anxious.     Past Medical History:  Diagnosis Date  . BPH (benign prostatic hyperplasia)   . CVA (cerebral vascular accident) (HCC) 1984   left side affected.    . Hypercholesterolemia     Past Surgical History:  Procedure Laterality Date  . LIPOMA EXCISION Left    groin     reports that he has quit smoking. He has never used smokeless tobacco. He reports that he drinks alcohol. He reports that he has current or past drug history. Drug: Marijuana.  No Known Allergies  Family History  Problem Relation Age of Onset  . Alcoholism Mother   . Kidney Stones Father     Prior to Admission medications   Medication Sig Start Date End Date Taking? Authorizing Provider  rosuvastatin (CRESTOR) 10 MG tablet Take 10 mg by mouth daily. 09/28/16  Yes [provider]  tamsulosin (FLOMAX) 0.4 MG CAPS capsule Take 0.4 mg by mouth at bedtime.  09/28/16  Yes [provider]  dabigatran (PRADAXA) 150 MG CAPS capsule Take 1 capsule (150 mg total) by mouth every 12 (twelve) hours. Patient not taking: Reported on 01/09/2018 11/18/16   Dimple Nanas, MD    Physical Exam:  Constitutional: Sick appearing elderly male is able to follow commands Vitals:   01/09/18 0030 01/09/18 0045  01/09/18 0130 01/09/18 0140  BP: 99/65 (!) 87/59 104/70   Pulse: 88 83 83   Resp:      Temp:    100 F (37.8 C)  TempSrc:    Rectal  SpO2: (!) 89% (!) 88% 92%   Weight:      Height:       Eyes: PERRL, lids and conjunctivae normal ENMT: Mucous membranes are dry. Posterior pharynx clear of any exudate or lesions. .  Neck: normal, supple, no masses, no thyromegaly Respiratory: clear to auscultation bilaterally, no wheezing, no crackles. Normal respiratory effort. No accessory muscle use.  Cardiovascular: Regular rate and rhythm, no murmurs / rubs / gallops. No extremity edema. 2+ pedal pulses. No carotid bruits.  Abdomen: Generalized abdominal tenderness, no masses palpated. No hepatosplenomegaly. Bowel sounds positive.  No peritoneal signs noted. Musculoskeletal: no clubbing / cyanosis. No joint deformity upper and lower extremities. Good ROM, no contractures. Normal muscle tone.  Skin: Diaphoretic with pallor present. no rashes, lesions, ulcers. No induration Neurologic: CN 2-12 grossly intact. Sensation intact, DTR normal. Strength 5/5 in all 4.  Psychiatric: Normal judgment and insight. Alert and oriented x 3. Normal mood.     Labs on Admission: I have personally reviewed following labs and imaging studies  CBC: Recent Labs  Lab 01/08/18 2205  WBC 7.9  HGB 17.5*  HCT 53.1*  MCV 89.5  PLT 286   Basic Metabolic Panel: Recent Labs  Lab 01/08/18 2205  NA 141  K 4.5  CL 107  CO2 27  GLUCOSE 145*  BUN 29*  CREATININE 1.42*  CALCIUM 9.5   GFR: Estimated Creatinine Clearance: 56.3 mL/min (A) (by C-G formula based on SCr of 1.42 mg/dL (H)). Liver Function Tests: Recent Labs  Lab 01/08/18 2205  AST 18  ALT 12*  ALKPHOS 52  BILITOT 0.7  PROT 6.7  ALBUMIN 3.9   Recent Labs  Lab 01/08/18 2205  LIPASE 38   No results for input(s): AMMONIA in the last 168 hours. Coagulation Profile: No results for input(s): INR, PROTIME in the last 168 hours. Cardiac  Enzymes: No results for input(s): CKTOTAL, CKMB, CKMBINDEX, TROPONINI in the last 168 hours. BNP (last 3 results) No results for input(s): PROBNP in the last 8760 hours. HbA1C: No results for input(s): HGBA1C in the last 72 hours. CBG: No results for input(s): GLUCAP in the last 168 hours. Lipid Profile: No results for input(s): CHOL, HDL, LDLCALC, TRIG, CHOLHDL, LDLDIRECT in the last 72 hours. Thyroid Function Tests: No results for input(s): TSH, T4TOTAL, FREET4, T3FREE, THYROIDAB in the last 72 hours. Anemia Panel: No results for input(s): VITAMINB12, FOLATE, FERRITIN, TIBC, IRON, RETICCTPCT in the last 72 hours. Urine analysis: No results found for: COLORURINE, APPEARANCEUR, LABSPEC, PHURINE, GLUCOSEU, HGBUR, BILIRUBINUR, KETONESUR, PROTEINUR, UROBILINOGEN, NITRITE, LEUKOCYTESUR Sepsis Labs: No results found for this or any previous visit (from the past 240 hour(s)).   Radiological Exams on Admission: Ct Angio Chest Pe W And/or Wo Contrast  Result Date: 01/09/2018 CLINICAL DATA:  Acute onset of epigastric abdominal pain, radiating to the lower abdomen. Hypotension. Personal history of pulmonary embolus. EXAM: CT ANGIOGRAPHY CHEST CT ABDOMEN AND PELVIS WITH CONTRAST TECHNIQUE: Multidetector CT  imaging of the chest was performed using the standard protocol during bolus administration of intravenous contrast. Multiplanar CT image reconstructions and MIPs were obtained to evaluate the vascular anatomy. Multidetector CT imaging of the abdomen and pelvis was performed using the standard protocol during bolus administration of intravenous contrast. CONTRAST:  ISOVUE-370 IOPAMIDOL (ISOVUE-370) INJECTION 76% COMPARISON:  CTA of the chest performed 11/13/2016, and CT of the chest, abdomen and pelvis performed 11/10/2005 FINDINGS: CTA CHEST FINDINGS Cardiovascular: There is pulmonary embolus within pulmonary arteries to the right lower lobe; this may be subacute in nature. The RV/LV ratio is 0.8,  within normal limits. The thoracic aorta is grossly unremarkable. The great vessels are within normal limits. The heart is normal in size. Mediastinum/Nodes: The mediastinum is grossly unremarkable. No mediastinal lymphadenopathy is seen. No pericardial effusion is identified. There is asymmetric enlargement of the right thyroid lobe. No axillary lymphadenopathy is seen. Lungs/Pleura: Mild bibasilar airspace opacities likely reflect atelectasis. No pleural effusion or pneumothorax is seen. No masses are identified. Musculoskeletal: No acute osseous abnormalities are identified. The visualized musculature is unremarkable in appearance. Review of the MIP images confirms the above findings. CT ABDOMEN and PELVIS FINDINGS Hepatobiliary: The liver is unremarkable in appearance. The gallbladder is unremarkable in appearance. The common bile duct remains normal in caliber. Pancreas: The pancreas is within normal limits. Spleen: The spleen is unremarkable in appearance. Adrenals/Urinary Tract: The adrenal glands are unremarkable in appearance. Scattered bilateral renal cysts are noted, with a large 10.5 cm cyst at the upper pole of the left kidney. There is no evidence of hydronephrosis. No renal or ureteral stones are identified. No perinephric stranding is seen. Stomach/Bowel: There is a slightly unusual appearance to the antrum of the stomach, without a definite ulceration. The stomach is otherwise grossly unremarkable. There is minimal haziness about ileal loops within the lower abdomen and pelvis, which could reflect a mild infectious or inflammatory process. Trace free fluid is noted tracking about small bowel loops, along the right paracolic gutter and within the pelvis. The appendix is not visualized; there is no evidence for appendicitis. Mild diverticulosis is noted along the sigmoid colon, without evidence of diverticulitis. Vascular/Lymphatic: Minimal calcification is noted at the distal abdominal aorta. The  aorta is otherwise unremarkable. The inferior vena cava is grossly unremarkable. No retroperitoneal lymphadenopathy is seen. No pelvic sidewall lymphadenopathy is identified. Reproductive: The bladder is mildly distended and grossly unremarkable. The prostate is enlarged, measuring 5.4 cm in transverse dimension. Other: No additional soft tissue abnormalities are seen. Musculoskeletal: No acute osseous abnormalities are identified. The intervertebral disc space narrowing is noted at L3-L4. There is grade 1 anterolisthesis of L4 on L5, reflecting underlying facet disease. The visualized musculature is unremarkable in appearance. Review of the MIP images confirms the above findings. IMPRESSION: 1. Pulmonary embolus within pulmonary arteries to the right lower lobe; this may be subacute in nature. RV/LV ratio remains within normal limits. 2. Mild bibasilar airspace opacities likely reflect atelectasis. Lungs otherwise clear. 3. Minimal haziness about ileal loops within the lower abdomen and pelvis, which could reflect a mild infectious or inflammatory process. 4. Scattered bilateral renal cysts, with a large 10.5 cm cyst at the upper pole of the left kidney. 5. Enlarged prostate noted. Critical Value/emergent results were called by telephone at the time of interpretation on 01/09/2018 at 12:48 am to [facility] PA, who verbally acknowledged these results. Electronically Signed   By: Roanna Raider M.D.   On: 01/09/2018 00:50   Ct  Abdomen Pelvis W Contrast  Result Date: 01/09/2018 CLINICAL DATA:  Acute onset of epigastric abdominal pain, radiating to the lower abdomen. Hypotension. Personal history of pulmonary embolus. EXAM: CT ANGIOGRAPHY CHEST CT ABDOMEN AND PELVIS WITH CONTRAST TECHNIQUE: Multidetector CT imaging of the chest was performed using the standard protocol during bolus administration of intravenous contrast. Multiplanar CT image reconstructions and MIPs were obtained to evaluate the vascular  anatomy. Multidetector CT imaging of the abdomen and pelvis was performed using the standard protocol during bolus administration of intravenous contrast. CONTRAST:  ISOVUE-370 IOPAMIDOL (ISOVUE-370) INJECTION 76% COMPARISON:  CTA of the chest performed 11/13/2016, and CT of the chest, abdomen and pelvis performed 11/10/2005 FINDINGS: CTA CHEST FINDINGS Cardiovascular: There is pulmonary embolus within pulmonary arteries to the right lower lobe; this may be subacute in nature. The RV/LV ratio is 0.8, within normal limits. The thoracic aorta is grossly unremarkable. The great vessels are within normal limits. The heart is normal in size. Mediastinum/Nodes: The mediastinum is grossly unremarkable. No mediastinal lymphadenopathy is seen. No pericardial effusion is identified. There is asymmetric enlargement of the right thyroid lobe. No axillary lymphadenopathy is seen. Lungs/Pleura: Mild bibasilar airspace opacities likely reflect atelectasis. No pleural effusion or pneumothorax is seen. No masses are identified. Musculoskeletal: No acute osseous abnormalities are identified. The visualized musculature is unremarkable in appearance. Review of the MIP images confirms the above findings. CT ABDOMEN and PELVIS FINDINGS Hepatobiliary: The liver is unremarkable in appearance. The gallbladder is unremarkable in appearance. The common bile duct remains normal in caliber. Pancreas: The pancreas is within normal limits. Spleen: The spleen is unremarkable in appearance. Adrenals/Urinary Tract: The adrenal glands are unremarkable in appearance. Scattered bilateral renal cysts are noted, with a large 10.5 cm cyst at the upper pole of the left kidney. There is no evidence of hydronephrosis. No renal or ureteral stones are identified. No perinephric stranding is seen. Stomach/Bowel: There is a slightly unusual appearance to the antrum of the stomach, without a definite ulceration. The stomach is otherwise grossly  unremarkable. There is minimal haziness about ileal loops within the lower abdomen and pelvis, which could reflect a mild infectious or inflammatory process. Trace free fluid is noted tracking about small bowel loops, along the right paracolic gutter and within the pelvis. The appendix is not visualized; there is no evidence for appendicitis. Mild diverticulosis is noted along the sigmoid colon, without evidence of diverticulitis. Vascular/Lymphatic: Minimal calcification is noted at the distal abdominal aorta. The aorta is otherwise unremarkable. The inferior vena cava is grossly unremarkable. No retroperitoneal lymphadenopathy is seen. No pelvic sidewall lymphadenopathy is identified. Reproductive: The bladder is mildly distended and grossly unremarkable. The prostate is enlarged, measuring 5.4 cm in transverse dimension. Other: No additional soft tissue abnormalities are seen. Musculoskeletal: No acute osseous abnormalities are identified. The intervertebral disc space narrowing is noted at L3-L4. There is grade 1 anterolisthesis of L4 on L5, reflecting underlying facet disease. The visualized musculature is unremarkable in appearance. Review of the MIP images confirms the above findings. IMPRESSION: 1. Pulmonary embolus within pulmonary arteries to the right lower lobe; this may be subacute in nature. RV/LV ratio remains within normal limits. 2. Mild bibasilar airspace opacities likely reflect atelectasis. Lungs otherwise clear. 3. Minimal haziness about ileal loops within the lower abdomen and pelvis, which could reflect a mild infectious or inflammatory process. 4. Scattered bilateral renal cysts, with a large 10.5 cm cyst at the upper pole of the left kidney. 5. Enlarged prostate noted. Critical Value/emergent  results were called by telephone at the time of interpretation on 01/09/2018 at 12:48 am to [facility]ERS[facility] PA, who verbally acknowledged these results. Electronically Signed   By: Roanna RaiderJeffery  Chang M.D.    On: 01/09/2018 00:50   Dg Chest Portable 1 View  Result Date: 01/08/2018 CLINICAL DATA:  Epigastric abdominal pain.  Hypotension. EXAM: PORTABLE CHEST 1 VIEW COMPARISON:  Radiographs and CT 11/13/2016 FINDINGS: Low lung volumes with bibasilar atelectasis. Mild cardiomegaly likely accentuated by low lung volumes. No confluent airspace disease, pleural effusion or pneumothorax. No acute osseous abnormalities. IMPRESSION: Low lung volumes with bibasilar atelectasis. Electronically Signed   By: Rubye OaksMelanie  Ehinger M.D.   On: 01/08/2018 23:39   Dg Abd Portable 2 Views  Result Date: 01/08/2018 CLINICAL DATA:  Epigastric abdominal pain.  Hypotension. EXAM: PORTABLE ABDOMEN - 2 VIEW COMPARISON:  None. FINDINGS: No free intra-abdominal air. Prominent air-filled small bowel loops in the left upper quadrant measure up to 3.3 cm. Moderate stool in the ascending and proximal transverse colon, minimal stool distally. No abnormal rectal distention. No radiopaque calculi. Bibasilar atelectasis. Scoliotic curvature in degenerative change in the spine. IMPRESSION: Prominent air-filled left upper quadrant bowel loops can be seen with enteritis, ileus, or less likely early obstruction. No free air. Electronically Signed   By: Rubye OaksMelanie  Ehinger M.D.   On: 01/08/2018 23:38    EKG: Independently reviewed.  Sinus rhythm at 81 bpm with LBBB similar to previous tracings  Assessment/Plan Pulmonary embolus, history of Pulmonary embolus and DVT: Acute.  Patient presents with complaints of abdominal pain but given previous history of PE currently not taking any anticoagulation.  Patient with no other reported risk factors.  - Admit to telemetry bed - Continue heparin per pharmacy - Check echocardiogram and doppler u/s of bilateral lower extremity - Determine best anticoagulant given patient's insurance  Ileitis/enteritis: Acute.  Presents with complaints of abdominal pain discomfort.  Lactic acid was noted to be mildly elevated  at 1.92.  Imaging studies showing signs of possible infection/ablation.  Patient was started on Zosyn for possible infection and blood cultures obtained. - Follow-up blood cultures - Continued Zosyn, de-escalate when medically appropriate  Lactic acidosis: Acute.  Initial lactic acid elevated at 1.92 with repeat noted to be within normal limits.  Suspect could be secondary to underlying infection. - Continue to monitor  Hypoxia: Acute.  O2 sats noted to be as low as 88% on room air suspect related to pulmonary embolus or pain medications given. - Continuous pulse oximetry overnight with nasal cannula oxygen as needed   Acute kidney injury: Baseline creatinine previously noted to be within normal limits.  Patient presents with a creatinine of 1.42 with BUN 29.  Suspect prerenal cause of symptoms. - IV fluids as tolerated - Recheck BMP in a.m.  Hypotension: Resolved.  Patient's initial blood pressures noted to be as low as 80/59 on admission.  After receiving IV fluids blood pressures improved. - Continue to monitor and give IV fluids as needed  Renal cyst: As noted per CT angiogram. BPH - Continue Flomax   RUE:[id number]:Patient insulin intimately noted to have enlarged prostate on imaging.  Question if patient has ever had PSA checked.  DVT prophylaxis:heparin  Code Status: full Family Communication: No family present at bedside Disposition Plan: Discharge home in 1 to 2 days Consults called: none Admission status: observation  Clydie Braunondell A Avari Gelles MD [facility] Pager [phone]   If 7PM-7AM, please contact night-coverage [email or web address] Password [email or web address]  01/09/2018, 1:46 AM

## 2018-01-09 NOTE — Progress Notes (Signed)
#   7. S/W NICOLAS @ [employer] [facility] # [phone]    1. ELIQUIS 2.50 MG BID  COVER- YES  CO-PAY- $ 3.80  TIER- 3 DRUG  PRIOR APPROVAL- NO   2. ELIQUIS 5 MG BID  COVER- YES  CO-PAY- $ 3.80  TIER- 3 DRUG  PRIOR APPROVAL- NO   3. XARELTO 15 MG BID  COVER- YES  CO-PAY- $ 3.80  TIER- 3 DRUG  PRIOR APPROVAL- NO    4. XARELTO 20 MG DAILY  COVER- YES  CO-PAY- $ 3.80  TIER- 3 DRUG  PRIOR APPROVAL- NO   PREFERRED PHARMACY : [facility]

## 2018-01-09 NOTE — Progress Notes (Signed)
ANTICOAGULATION CONSULT NOTE - Initial Consult  Pharmacy Consult for heparin Indication: pulmonary embolus  No Known Allergies  Patient Measurements: Height: 6\' 1"  (185.4 cm) Weight: 184 lb (83.5 kg) IBW/kg (Calculated) : 79.9  Vital Signs: Temp: 99.2 F (37.3 C) (06/03 2345) Temp Source: Rectal (06/03 2345) BP: 110/92 (06/03 2345) Pulse Rate: 85 (06/03 2345)  Labs: Recent Labs    01/08/18 2205  HGB 17.5*  HCT 53.1*  PLT 286  CREATININE 1.42*    Estimated Creatinine Clearance: 56.3 mL/min (A) (by C-G formula based on SCr of 1.42 mg/dL (H)).   Medical History: Past Medical History:  Diagnosis Date  . BPH (benign prostatic hyperplasia)   . CVA (cerebral vascular accident) (HCC) 1984   left side affected.    . Hypercholesterolemia     Assessment: 67yo male c/o abdominal pain, CT reveals PE (possibly subacute) to begin heparin; of note, pt had PE in April 2018 tx'd w/ Pradaxa, which has since been d/c'd.  Goal of Therapy:  Heparin level 0.3-0.7 units/ml Monitor platelets by anticoagulation protocol: Yes   Plan:  Will give heparin 5000 units IV bolus x1 followed by gtt at 1350 units/hr (was previously therapeutic at this rate) and monitor heparin levels and CBC; f/u long-term anticoag plans.  Vernard GamblesVeronda Kaylynne Andres, PharmD, BCPS  01/09/2018,1:12 AM

## 2018-01-10 ENCOUNTER — Inpatient Hospital Stay (HOSPITAL_COMMUNITY): Payer: Medicare HMO

## 2018-01-10 DIAGNOSIS — I361 Nonrheumatic tricuspid (valve) insufficiency: Secondary | ICD-10-CM

## 2018-01-10 LAB — CBC
HEMATOCRIT: 47.1 % (ref 39.0–52.0)
Hemoglobin: 15.4 g/dL (ref 13.0–17.0)
MCH: 29.6 pg (ref 26.0–34.0)
MCHC: 32.7 g/dL (ref 30.0–36.0)
MCV: 90.6 fL (ref 78.0–100.0)
PLATELETS: 189 10*3/uL (ref 150–400)
RBC: 5.2 MIL/uL (ref 4.22–5.81)
RDW: 13.4 % (ref 11.5–15.5)
WBC: 10.9 10*3/uL — AB (ref 4.0–10.5)

## 2018-01-10 LAB — ECHOCARDIOGRAM COMPLETE
HEIGHTINCHES: 73 in
Weight: 2944 oz

## 2018-01-10 LAB — HEPARIN LEVEL (UNFRACTIONATED): Heparin Unfractionated: 0.22 IU/mL — ABNORMAL LOW (ref 0.30–0.70)

## 2018-01-10 MED ORDER — RIVAROXABAN 15 MG PO TABS
15.0000 mg | ORAL_TABLET | Freq: Two times a day (BID) | ORAL | Status: DC
  Administered 2018-01-10 (×2): 15 mg via ORAL
  Filled 2018-01-10 (×2): qty 1

## 2018-01-10 NOTE — Progress Notes (Signed)
PROGRESS NOTE    Jason Myers  AVW:[id number] DOB: [birth year] DOA: 01/08/2018 PCP: Gean BirchwoodPa, [facility]    Brief Narrative:  Jason Myers a 67 y.o.malewith medical history significant ofhemorrhagic strokes/pruptured aneurysm in 1984, history of DVT/PE, HLD, and BPH;who presents with complaints of generalized abdominal pain was found to have enteritis , was started on zosyn. He was also found to have acute PE/ DVT AND was started on IV heparin.  He was later on transitioned to oral xarelto for continued anticoagulation.    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  Echocardiogram pending.  He is on RA with good sats.     Abdominal pain with nausea: Improved. Probably secondary to enteritis , was started on zosyn.  Plan to transition to oral antibiotics once his pain improves.    AKI:  Improved with hydration.  Repeat renal parameters in am.   Hyperlipidemia:  Resume crestor.    BPH:  On flomax.    Hypotension: on admission resolved.   DVT prophylaxis:xarelto Code Status:  Full code.  Family Communication: none at bedside.  Disposition Plan: possibly home tomorrow.    Consultants:   None.    Procedures: echocardiogram pending.    Antimicrobials:zosyn. Since admission.    Subjective: Reports his abd pain is better today, but not completely resolved. No nausea, or vomiting.   Objective: Vitals:   01/09/18 0819 01/09/18 1608 01/09/18 2355 01/10/18 0802  BP: (!) 146/106 (!) 127/94 (!) 121/91 (!) 129/94  Pulse: 97 98 93 98  Resp: 17 17  18   Temp: 98.8 F (37.1 C) 98.3 F (36.8 C) 98.6 F (37 C) 98.5 F (36.9 C)  TempSrc: Oral Oral Oral  Oral  SpO2: 98% 98% 97% 98%  Weight:      Height:        Intake/Output Summary (Last 24 hours) at 01/10/2018 1252 Last data filed at 01/10/2018 1104 Gross per 24 hour  Intake 3214.79 ml  Output 700 ml  Net 2514.79 ml   Filed Weights   01/08/18 2345  Weight: 83.5 kg (184 lb)    Examination:  General exam: Appears calm and comfortable  Respiratory system: Clear to auscultation. Respiratory effort normal. Cardiovascular system: S1 & S2 heard, RRR. No JVD, murmurs, rubs, gallops or clicks. No pedal edema. Gastrointestinal system: Abdomen is nondistended, soft and nontender. No organomegaly or masses felt. Normal bowel sounds heard. Central nervous system: Alert and oriented. No focal neurological deficits. Extremities: Symmetric 5 x 5 power. Skin: No rashes, lesions or ulcers Psychiatry: Judgement and insight appear normal. Mood & affect appropriate.     Data Reviewed: I have personally reviewed following labs and imaging studies  CBC: Recent Labs  Lab 01/08/18 2205 01/09/18 0221 01/10/18 0344  WBC 7.9 9.9 10.9*  HGB 17.5* 17.1* 15.4  HCT 53.1* 51.9 47.1  MCV 89.5 89.5 90.6  PLT 286 226 189   Basic Metabolic Panel: Recent Labs  Lab 01/08/18 2205 01/09/18 0221  NA 141 141  K 4.5 4.3  CL 107 112*  CO2 27 20*  GLUCOSE 145* 138*  BUN 29* 28*  CREATININE 1.42* 1.13  CALCIUM 9.5 8.8*  GFR: Estimated Creatinine Clearance: 70.7 mL/min (by C-G formula based on SCr of 1.13 mg/dL). Liver Function Tests: Recent Labs  Lab 01/08/18 2205  AST 18  ALT 12*  ALKPHOS 52  BILITOT 0.7  PROT 6.7  ALBUMIN 3.9   Recent Labs  Lab 01/08/18 2205  LIPASE 38   No results for input(s): AMMONIA in the last 168 hours. Coagulation Profile: Recent Labs  Lab 01/09/18 0221  INR 1.06   Cardiac Enzymes: No results for input(s): CKTOTAL, CKMB, CKMBINDEX, TROPONINI in the last 168 hours. BNP (last 3 results) No results for input(s): PROBNP in the last 8760 hours. HbA1C: No  results for input(s): HGBA1C in the last 72 hours. CBG: No results for input(s): GLUCAP in the last 168 hours. Lipid Profile: No results for input(s): CHOL, HDL, LDLCALC, TRIG, CHOLHDL, LDLDIRECT in the last 72 hours. Thyroid Function Tests: No results for input(s): TSH, T4TOTAL, FREET4, T3FREE, THYROIDAB in the last 72 hours. Anemia Panel: No results for input(s): VITAMINB12, FOLATE, FERRITIN, TIBC, IRON, RETICCTPCT in the last 72 hours. Sepsis Labs: Recent Labs  Lab 01/08/18 2252 01/09/18 0037  LATICACIDVEN 1.92* 1.67    No results found for this or any previous visit (from the past 240 hour(s)).       Radiology Studies: Ct Angio Chest Pe W And/or Wo Contrast  Result Date: 01/09/2018 CLINICAL DATA:  Acute onset of epigastric abdominal pain, radiating to the lower abdomen. Hypotension. Personal history of pulmonary embolus. EXAM: CT ANGIOGRAPHY CHEST CT ABDOMEN AND PELVIS WITH CONTRAST TECHNIQUE: Multidetector CT imaging of the chest was performed using the standard protocol during bolus administration of intravenous contrast. Multiplanar CT image reconstructions and MIPs were obtained to evaluate the vascular anatomy. Multidetector CT imaging of the abdomen and pelvis was performed using the standard protocol during bolus administration of intravenous contrast. CONTRAST:  ISOVUE-370 IOPAMIDOL (ISOVUE-370) INJECTION 76% COMPARISON:  CTA of the chest performed 11/13/2016, and CT of the chest, abdomen and pelvis performed 11/10/2005 FINDINGS: CTA CHEST FINDINGS Cardiovascular: There is pulmonary embolus within pulmonary arteries to the right lower lobe; this may be subacute in nature. The RV/LV ratio is 0.8, within normal limits. The thoracic aorta is grossly unremarkable. The great vessels are within normal limits. The heart is normal in size. Mediastinum/Nodes: The mediastinum is grossly unremarkable. No mediastinal lymphadenopathy is seen. No pericardial effusion is identified. There  is asymmetric enlargement of the right thyroid lobe. No axillary lymphadenopathy is seen. Lungs/Pleura: Mild bibasilar airspace opacities likely reflect atelectasis. No pleural effusion or pneumothorax is seen. No masses are identified. Musculoskeletal: No acute osseous abnormalities are identified. The visualized musculature is unremarkable in appearance. Review of the MIP images confirms the above findings. CT ABDOMEN and PELVIS FINDINGS Hepatobiliary: The liver is unremarkable in appearance. The gallbladder is unremarkable in appearance. The common bile duct remains normal in caliber. Pancreas: The pancreas is within normal limits. Spleen: The spleen is unremarkable in appearance. Adrenals/Urinary Tract: The adrenal glands are unremarkable in appearance. Scattered bilateral renal cysts are noted, with a large 10.5 cm cyst at the upper pole of the left kidney. There is no evidence of hydronephrosis. No renal or ureteral stones are identified. No perinephric stranding is seen. Stomach/Bowel: There is a slightly unusual appearance to the antrum of the stomach, without a definite ulceration. The stomach is otherwise grossly unremarkable. There is minimal haziness about ileal loops within the lower abdomen and pelvis, which could reflect a mild infectious or inflammatory process. Trace free fluid is noted  tracking about small bowel loops, along the right paracolic gutter and within the pelvis. The appendix is not visualized; there is no evidence for appendicitis. Mild diverticulosis is noted along the sigmoid colon, without evidence of diverticulitis. Vascular/Lymphatic: Minimal calcification is noted at the distal abdominal aorta. The aorta is otherwise unremarkable. The inferior vena cava is grossly unremarkable. No retroperitoneal lymphadenopathy is seen. No pelvic sidewall lymphadenopathy is identified. Reproductive: The bladder is mildly distended and grossly unremarkable. The prostate is enlarged, measuring 5.4  cm in transverse dimension. Other: No additional soft tissue abnormalities are seen. Musculoskeletal: No acute osseous abnormalities are identified. The intervertebral disc space narrowing is noted at L3-L4. There is grade 1 anterolisthesis of L4 on L5, reflecting underlying facet disease. The visualized musculature is unremarkable in appearance. Review of the MIP images confirms the above findings. IMPRESSION: 1. Pulmonary embolus within pulmonary arteries to the right lower lobe; this may be subacute in nature. RV/LV ratio remains within normal limits. 2. Mild bibasilar airspace opacities likely reflect atelectasis. Lungs otherwise clear. 3. Minimal haziness about ileal loops within the lower abdomen and pelvis, which could reflect a mild infectious or inflammatory process. 4. Scattered bilateral renal cysts, with a large 10.5 cm cyst at the upper pole of the left kidney. 5. Enlarged prostate noted. Critical Value/emergent results were called by telephone at the time of interpretation on 01/09/2018 at 12:48 am to [place] PA, who verbally acknowledged these results. Electronically Signed   By: Roanna Raider M.D.   On: 01/09/2018 00:50   Ct Abdomen Pelvis W Contrast  Result Date: 01/09/2018 CLINICAL DATA:  Acute onset of epigastric abdominal pain, radiating to the lower abdomen. Hypotension. Personal history of pulmonary embolus. EXAM: CT ANGIOGRAPHY CHEST CT ABDOMEN AND PELVIS WITH CONTRAST TECHNIQUE: Multidetector CT imaging of the chest was performed using the standard protocol during bolus administration of intravenous contrast. Multiplanar CT image reconstructions and MIPs were obtained to evaluate the vascular anatomy. Multidetector CT imaging of the abdomen and pelvis was performed using the standard protocol during bolus administration of intravenous contrast. CONTRAST:  ISOVUE-370 IOPAMIDOL (ISOVUE-370) INJECTION 76% COMPARISON:  CTA of the chest performed 11/13/2016, and CT of the  chest, abdomen and pelvis performed 11/10/2005 FINDINGS: CTA CHEST FINDINGS Cardiovascular: There is pulmonary embolus within pulmonary arteries to the right lower lobe; this may be subacute in nature. The RV/LV ratio is 0.8, within normal limits. The thoracic aorta is grossly unremarkable. The great vessels are within normal limits. The heart is normal in size. Mediastinum/Nodes: The mediastinum is grossly unremarkable. No mediastinal lymphadenopathy is seen. No pericardial effusion is identified. There is asymmetric enlargement of the right thyroid lobe. No axillary lymphadenopathy is seen. Lungs/Pleura: Mild bibasilar airspace opacities likely reflect atelectasis. No pleural effusion or pneumothorax is seen. No masses are identified. Musculoskeletal: No acute osseous abnormalities are identified. The visualized musculature is unremarkable in appearance. Review of the MIP images confirms the above findings. CT ABDOMEN and PELVIS FINDINGS Hepatobiliary: The liver is unremarkable in appearance. The gallbladder is unremarkable in appearance. The common bile duct remains normal in caliber. Pancreas: The pancreas is within normal limits. Spleen: The spleen is unremarkable in appearance. Adrenals/Urinary Tract: The adrenal glands are unremarkable in appearance. Scattered bilateral renal cysts are noted, with a large 10.5 cm cyst at the upper pole of the left kidney. There is no evidence of hydronephrosis. No renal or ureteral stones are identified. No perinephric stranding is seen. Stomach/Bowel: There is a slightly unusual appearance to the  antrum of the stomach, without a definite ulceration. The stomach is otherwise grossly unremarkable. There is minimal haziness about ileal loops within the lower abdomen and pelvis, which could reflect a mild infectious or inflammatory process. Trace free fluid is noted tracking about small bowel loops, along the right paracolic gutter and within the pelvis. The appendix is not  visualized; there is no evidence for appendicitis. Mild diverticulosis is noted along the sigmoid colon, without evidence of diverticulitis. Vascular/Lymphatic: Minimal calcification is noted at the distal abdominal aorta. The aorta is otherwise unremarkable. The inferior vena cava is grossly unremarkable. No retroperitoneal lymphadenopathy is seen. No pelvic sidewall lymphadenopathy is identified. Reproductive: The bladder is mildly distended and grossly unremarkable. The prostate is enlarged, measuring 5.4 cm in transverse dimension. Other: No additional soft tissue abnormalities are seen. Musculoskeletal: No acute osseous abnormalities are identified. The intervertebral disc space narrowing is noted at L3-L4. There is grade 1 anterolisthesis of L4 on L5, reflecting underlying facet disease. The visualized musculature is unremarkable in appearance. Review of the MIP images confirms the above findings. IMPRESSION: 1. Pulmonary embolus within pulmonary arteries to the right lower lobe; this may be subacute in nature. RV/LV ratio remains within normal limits. 2. Mild bibasilar airspace opacities likely reflect atelectasis. Lungs otherwise clear. 3. Minimal haziness about ileal loops within the lower abdomen and pelvis, which could reflect a mild infectious or inflammatory process. 4. Scattered bilateral renal cysts, with a large 10.5 cm cyst at the upper pole of the left kidney. 5. Enlarged prostate noted. Critical Value/emergent results were called by telephone at the time of interpretation on 01/09/2018 at 12:48 am to [facility] PA, who verbally acknowledged these results. Electronically Signed   By: Roanna Raider M.D.   On: 01/09/2018 00:50   Dg Chest Portable 1 View  Result Date: 01/08/2018 CLINICAL DATA:  Epigastric abdominal pain.  Hypotension. EXAM: PORTABLE CHEST 1 VIEW COMPARISON:  Radiographs and CT 11/13/2016 FINDINGS: Low lung volumes with bibasilar atelectasis. Mild cardiomegaly likely  accentuated by low lung volumes. No confluent airspace disease, pleural effusion or pneumothorax. No acute osseous abnormalities. IMPRESSION: Low lung volumes with bibasilar atelectasis. Electronically Signed   By: Rubye Oaks M.D.   On: 01/08/2018 23:39   Dg Abd Portable 2 Views  Result Date: 01/08/2018 CLINICAL DATA:  Epigastric abdominal pain.  Hypotension. EXAM: PORTABLE ABDOMEN - 2 VIEW COMPARISON:  None. FINDINGS: No free intra-abdominal air. Prominent air-filled small bowel loops in the left upper quadrant measure up to 3.3 cm. Moderate stool in the ascending and proximal transverse colon, minimal stool distally. No abnormal rectal distention. No radiopaque calculi. Bibasilar atelectasis. Scoliotic curvature in degenerative change in the spine. IMPRESSION: Prominent air-filled left upper quadrant bowel loops can be seen with enteritis, ileus, or less likely early obstruction. No free air. Electronically Signed   By: Rubye Oaks M.D.   On: 01/08/2018 23:38        Scheduled Meds: . Rivaroxaban  15 mg Oral BID WC  . rosuvastatin  10 mg Oral Daily  . tamsulosin  0.4 mg Oral QHS   Continuous Infusions: . sodium chloride 1,000 mL (01/10/18 0526)  . piperacillin-tazobactam (ZOSYN)  IV 3.375 g (01/10/18 0859)     LOS: 1 day    Time spent: 35 minutes.     Kathlen Mody, MD [facility] Pager [phone]   If 7PM-7AM, please contact night-coverage [email or web address] Password [email or web address] 01/10/2018, 12:52 PM

## 2018-01-10 NOTE — Care Management Note (Addendum)
Case Management Note  Patient Details  Name: Jason Myers MRN: [id number] Date of Birth: [birth year]  Subjective/Objective:  From home, presents with pe, heparin drip off, will be on xarelto, Patient has 30 day savings coupon.    Co pay is 3.80.               Action/Plan: DC home when ready.  Expected Discharge Date:                  Expected Discharge Plan:  Home/Self Care  In-House Referral:     Discharge planning Services  CM Consult  Post Acute Care Choice:    Choice offered to:     DME Arranged:    DME Agency:     HH Arranged:    HH Agency:     Status of Service:  Completed, signed off  If discussed at [employer] Length of Stay Meetings, dates discussed:    Additional Comments:  Leone Havenaylor, Kaisei Gilbo Clinton, RN 01/10/2018, 11:33 AM

## 2018-01-10 NOTE — Progress Notes (Addendum)
ANTICOAGULATION CONSULT NOTE - Follow-Up  Pharmacy Consult for Heparin Indication: pulmonary embolus  No Known Allergies  Patient Measurements: Height: 6\' 1"  (185.4 cm) Weight: 184 lb (83.5 kg) IBW/kg (Calculated) : 79.9  Vital Signs: Temp: 98.6 F (37 C) (06/04 2355) Temp Source: Oral (06/04 2355) BP: 121/91 (06/04 2355) Pulse Rate: 93 (06/04 2355)  Labs: Recent Labs    01/08/18 2205 01/09/18 0221  01/09/18 1545 01/09/18 2252 01/10/18 0344  HGB 17.5* 17.1*  --   --   --  15.4  HCT 53.1* 51.9  --   --   --  47.1  PLT 286 226  --   --   --  189  LABPROT  --  13.8  --   --   --   --   INR  --  1.06  --   --   --   --   HEPARINUNFRC  --   --    < > 0.59 0.40 0.22*  CREATININE 1.42* 1.13  --   --   --   --    < > = values in this interval not displayed.    Estimated Creatinine Clearance: 70.7 mL/min (by C-G formula based on SCr of 1.13 mg/dL).   Medical History: Past Medical History:  Diagnosis Date  . BPH (benign prostatic hyperplasia)   . CVA (cerebral vascular accident) (HCC) 1984   left side affected.    . Hypercholesterolemia     Assessment: 67yo male c/o abdominal pain, CT reveals PE (possibly subacute) to begin heparin; of note, pt had PE in April 2018 tx'd w/ Pradaxa, which has since been d/c'd.  Heparin level this morning is SUPRAtherapeutic (HL 0.78, goal of 0.3-0.7). This may still be reflective of the bolus given with initiation, CBC stable, no bleeding noted.   6/5 AM update: heparin level sub-therapeutic this AM  Goal of Therapy:  Heparin level 0.3-0.7 units/ml Monitor platelets by anticoagulation protocol: Yes   Plan:  Inc heparin to 1400 units/hr 1300 HL  Abran DukeJames Ledford, PharmD, BCPS Clinical Pharmacist Phone: [phone]  9 AM: Switch to Xarelto 15 mg po BID x 3 weeks then 20 mg po daily  Thank you Okey RegalLisa Ajanay Farve, PharmD

## 2018-01-10 NOTE — Progress Notes (Signed)
  Echocardiogram 2D Echocardiogram has been performed.  Taiwan Millon L Androw 01/10/2018, 2:02 PM

## 2018-01-11 ENCOUNTER — Inpatient Hospital Stay (HOSPITAL_COMMUNITY): Payer: Medicare HMO

## 2018-01-11 DIAGNOSIS — K922 Gastrointestinal hemorrhage, unspecified: Secondary | ICD-10-CM

## 2018-01-11 DIAGNOSIS — K56609 Unspecified intestinal obstruction, unspecified as to partial versus complete obstruction: Secondary | ICD-10-CM

## 2018-01-11 DIAGNOSIS — K920 Hematemesis: Secondary | ICD-10-CM

## 2018-01-11 DIAGNOSIS — R933 Abnormal findings on diagnostic imaging of other parts of digestive tract: Secondary | ICD-10-CM

## 2018-01-11 LAB — CBC
HEMATOCRIT: 48.5 % (ref 39.0–52.0)
Hemoglobin: 15.9 g/dL (ref 13.0–17.0)
MCH: 29.2 pg (ref 26.0–34.0)
MCHC: 32.8 g/dL (ref 30.0–36.0)
MCV: 89 fL (ref 78.0–100.0)
Platelets: 226 10*3/uL (ref 150–400)
RBC: 5.45 MIL/uL (ref 4.22–5.81)
RDW: 13.2 % (ref 11.5–15.5)
WBC: 13.4 10*3/uL — AB (ref 4.0–10.5)

## 2018-01-11 LAB — COMPREHENSIVE METABOLIC PANEL
ALBUMIN: 2.9 g/dL — AB (ref 3.5–5.0)
ALT: 16 U/L — AB (ref 17–63)
AST: 19 U/L (ref 15–41)
Alkaline Phosphatase: 54 U/L (ref 38–126)
Anion gap: 10 (ref 5–15)
BILIRUBIN TOTAL: 1 mg/dL (ref 0.3–1.2)
BUN: 23 mg/dL — AB (ref 6–20)
CHLORIDE: 101 mmol/L (ref 101–111)
CO2: 27 mmol/L (ref 22–32)
CREATININE: 0.9 mg/dL (ref 0.61–1.24)
Calcium: 8.8 mg/dL — ABNORMAL LOW (ref 8.9–10.3)
GFR calc Af Amer: >60 mL/min (ref >60)
GFR calc non Af Amer: >60 mL/min (ref >60)
GLUCOSE: 137 mg/dL — AB (ref 65–99)
Potassium: 3.5 mmol/L (ref 3.5–5.1)
Sodium: 138 mmol/L (ref 135–145)
Total Protein: 6.3 g/dL — ABNORMAL LOW (ref 6.5–8.1)

## 2018-01-11 LAB — OCCULT BLOOD GASTRIC / DUODENUM (SPECIMEN CUP): OCCULT BLOOD, GASTRIC: POSITIVE — AB

## 2018-01-11 LAB — LACTIC ACID, PLASMA: Lactic Acid, Venous: 1.4 mmol/L (ref 0.5–1.9)

## 2018-01-11 LAB — HEMOGLOBIN AND HEMATOCRIT, BLOOD
HEMATOCRIT: 43.5 % (ref 39.0–52.0)
HEMOGLOBIN: 14.4 g/dL (ref 13.0–17.0)

## 2018-01-11 MED ORDER — SODIUM CHLORIDE 0.9 % IV SOLN
80.0000 mg | Freq: Once | INTRAVENOUS | Status: AC
  Administered 2018-01-11: 80 mg via INTRAVENOUS
  Filled 2018-01-11: qty 80

## 2018-01-11 MED ORDER — HEPARIN (PORCINE) IN NACL 100-0.45 UNIT/ML-% IJ SOLN
1650.0000 [IU]/h | INTRAMUSCULAR | Status: DC
  Administered 2018-01-11: 1400 [IU]/h via INTRAVENOUS
  Administered 2018-01-12: 1550 [IU]/h via INTRAVENOUS
  Administered 2018-01-12: 1700 [IU]/h via INTRAVENOUS
  Administered 2018-01-13 – 2018-01-17 (×7): 1650 [IU]/h via INTRAVENOUS
  Filled 2018-01-11 (×10): qty 250

## 2018-01-11 MED ORDER — DIATRIZOATE MEGLUMINE & SODIUM 66-10 % PO SOLN
90.0000 mL | Freq: Once | ORAL | Status: AC
  Administered 2018-01-11: 90 mL via NASOGASTRIC
  Filled 2018-01-11: qty 90

## 2018-01-11 MED ORDER — FAMOTIDINE IN NACL 20-0.9 MG/50ML-% IV SOLN
20.0000 mg | Freq: Two times a day (BID) | INTRAVENOUS | Status: DC
  Administered 2018-01-11 – 2018-01-15 (×9): 20 mg via INTRAVENOUS
  Filled 2018-01-11 (×9): qty 50

## 2018-01-11 NOTE — Progress Notes (Signed)
Pts oxygen saturations have dipped into the low 80s throughout the shift. RN has continuously put pts oxygen back on, however pt continues to take nasal cannula off. Pt in NAD, states the nasal cannula is uncomfortable. Pt reminded of the importance of wearing the nasal cannula to help maintain his oxygenation.  Will continue to monitor. Caswell Corwinana C Amy Belloso, RN 01/11/18 4:07 AM

## 2018-01-11 NOTE — Progress Notes (Signed)
Successful placement of a 40F NGT in right nares confirmed by 1L of immediate black output and xray that shows tube in the stomach by myself.  Secured in place by [employer].  Letha CapeKelly E Keiden Deskin 01/11/2018

## 2018-01-11 NOTE — Progress Notes (Addendum)
Pt has had hiccups for most of the night. Pt thought he had to cough and ended up vomiting dark brown/black emesis. Pt in NAD, will continue to monitor. [facility]. Received orders for gastric occult, abdominal xray, and will make pt NPO and hold AM dose of xarelto.  Caswell Corwinana C Sanay Belmar, RN 01/11/18 5:51 AM

## 2018-01-11 NOTE — Consult Note (Signed)
[facility] Surgery Consult/Admission Note  Jason Myers 1950-12-26  [id number].    Requesting MD: Dr. Blake Divine Chief Complaint/Reason for Consult: SBO  HPI:   Pt is a 67 y.o.malewith medical history significant ofhemorrhagic strokes/pruptured aneurysm in 1984, history of DVT/PE, HLD, and BPH who was admitted on 06/03 with PE/DVT and enteritis. Pt was started on IV heparin. Pt was noted to have vomiting this am and abd xray showed SBO. When I came into the patient's room he was actively vomiting. NGT placed. Pt states he has been having abdominal pain for 4 days. He has never had a SBO before and he has no history of abdominal surgeries. Abdominal pain is mild at rest, non radiating, worse with hiccups or cough. He has been tolerating liquids since admission until this am. Pt states he is more bloated. Emesis is dark. Pt denies fever or chills. Lactic acid is WNL. WBC is slightly elevated to 13.4. CT abd/pel done on 06/04 showed minimal haziness about ileal loops within the lower abdomen, which could reflect a mild infectious or inflammatory process.   ROS:  Review of Systems  Constitutional: Negative for chills, diaphoresis and fever.  HENT: Negative for sore throat.   Respiratory: Negative for cough and shortness of breath.   Cardiovascular: Negative for chest pain.  Gastrointestinal: Positive for abdominal pain, nausea and vomiting. Negative for blood in stool, constipation and diarrhea.  Genitourinary: Negative for dysuria.  Skin: Negative for rash.  Neurological: Negative for dizziness and loss of consciousness.  All other systems reviewed and are negative.    Family History  Problem Relation Age of Onset  . Alcoholism Mother   . Kidney Stones Father     Past Medical History:  Diagnosis Date  . BPH (benign prostatic hyperplasia)   . CVA (cerebral vascular accident) (HCC) 1984   left side affected.    . Hypercholesterolemia     Past Surgical History:  Procedure  Laterality Date  . LIPOMA EXCISION Left    groin    Social History:  reports that he has quit smoking. He has never used smokeless tobacco. He reports that he drinks alcohol. He reports that he has current or past drug history. Drug: Marijuana.  Allergies: No Known Allergies  Medications Prior to Admission  Medication Sig Dispense Refill  . rosuvastatin (CRESTOR) 10 MG tablet Take 10 mg by mouth daily.    . tamsulosin (FLOMAX) 0.4 MG CAPS capsule Take 0.4 mg by mouth at bedtime.     . dabigatran (PRADAXA) 150 MG CAPS capsule Take 1 capsule (150 mg total) by mouth every 12 (twelve) hours. (Patient not taking: Reported on 01/09/2018) 60 capsule 3    Blood pressure (!) 172/112, pulse 95, temperature 99.2 F (37.3 C), temperature source Oral, resp. rate 17, height 6\' 1"  (1.854 m), weight 83.5 kg (184 lb), SpO2 93 %.  Physical Exam  Constitutional: He is oriented to person, place, and time. He appears well-developed and well-nourished.  Non-toxic appearance.  Actively vomiting  HENT:  Head: Normocephalic and atraumatic.  Nose: Nose normal.  Mouth/Throat: Oropharynx is clear and moist and mucous membranes are normal. No oropharyngeal exudate.  Eyes: Conjunctivae and lids are normal. Right eye exhibits no discharge. Left eye exhibits no discharge. No scleral icterus.  Pupils are equal and round   Neck: Normal range of motion. Neck supple. No thyromegaly present.  Cardiovascular: Normal rate, regular rhythm, S1 normal, S2 normal and normal heart sounds.  Pulmonary/Chest: Effort normal and breath sounds normal.  No respiratory distress. He has no wheezes. He has no rhonchi. He has no rales.  Abdominal: Soft. He exhibits distension. Bowel sounds are decreased. There is generalized tenderness. There is no rigidity and no guarding.  Unable to assess HSM due to abdominal distention. No guarding. No signs of peritonitis   Musculoskeletal: He exhibits no tenderness.  Contracture of LUE   Lymphadenopathy:    He has no cervical adenopathy.  Neurological: He is alert and oriented to person, place, and time.  Skin: Skin is warm and dry. No rash noted. He is not diaphoretic.  Psychiatric: He has a normal mood and affect.  Nursing note and vitals reviewed.   Results for orders placed or performed during the hospital encounter of 01/08/18 (from the past 48 hour(s))  Heparin level (unfractionated)     Status: None   Collection Time: 01/09/18  3:45 PM  Result Value Ref Range   Heparin Unfractionated 0.59 0.30 - 0.70 IU/mL    Comment: (NOTE) If heparin results are below expected values, and patient dosage has  been confirmed, suggest follow up testing of antithrombin III levels. Performed at [facility], [address], [place], [place] [address]   Heparin level (unfractionated)     Status: None   Collection Time: 01/09/18 10:52 PM  Result Value Ref Range   Heparin Unfractionated 0.40 0.30 - 0.70 IU/mL    Comment: (NOTE) If heparin results are below expected values, and patient dosage has  been confirmed, suggest follow up testing of antithrombin III levels. Performed at [facility], [address] [place], [place] [address]   Heparin level (unfractionated)     Status: Abnormal   Collection Time: 01/10/18  3:44 AM  Result Value Ref Range   Heparin Unfractionated 0.22 (L) 0.30 - 0.70 IU/mL    Comment: (NOTE) If heparin results are below expected values, and patient dosage has  been confirmed, suggest follow up testing of antithrombin III levels. Performed at [facility], [address] [place], [place] [address]   CBC     Status: Abnormal   Collection Time: 01/10/18  3:44 AM  Result Value Ref Range   WBC 10.9 (H) 4.0 - 10.5 K/uL   RBC 5.20 4.22 - 5.81 MIL/uL   Hemoglobin 15.4 13.0 - 17.0 g/dL   HCT 91.4 78.2 - 95.6 %   MCV 90.6 78.0 - 100.0 fL   MCH 29.6 26.0 - 34.0 pg   MCHC 32.7 30.0 - 36.0 g/dL   RDW 21.3 08.6 - 57.8 %   Platelets  189 150 - 400 K/uL    Comment: Performed at [facility], [address] [place], [place] [address]  Occult blood gastric / duodenum     Status: Abnormal   Collection Time: 01/11/18  5:52 AM  Result Value Ref Range   pH, Gastric NOT DONE    Occult Blood, Gastric POSITIVE (A) NEGATIVE    Comment: Performed at [facility], [address] [place], [place] [address]  CBC     Status: Abnormal   Collection Time: 01/11/18  6:01 AM  Result Value Ref Range   WBC 13.4 (H) 4.0 - 10.5 K/uL   RBC 5.45 4.22 - 5.81 MIL/uL   Hemoglobin 15.9 13.0 - 17.0 g/dL   HCT 13.2 44.0 - 10.2 %   MCV 89.0 78.0 - 100.0 fL   MCH 29.2 26.0 - 34.0 pg   MCHC 32.8 30.0 - 36.0 g/dL  RDW 13.2 11.5 - 15.5 %   Platelets 226 150 - 400 K/uL    Comment: Performed at [facility], [address] [place], [place] [id number]   Dg Abd Portable 1v  Result Date: 01/11/2018 CLINICAL DATA:  Nausea/vomiting/Hematemesis ,abdomen distention EXAM: PORTABLE ABDOMEN - 1 VIEW COMPARISON:  CT of the abdomen and pelvis on 01/09/2018 FINDINGS: There is significant dilatation of small bowel loops throughout the abdomen, significantly increased compared to prior study. Large bowel loops are not dilated. No evidence for free intraperitoneal air on this supine view. Mild degenerative changes are seen in the lumbar spine. IMPRESSION: High-grade small bowel obstruction. These results will be called to the ordering clinician or representative by the Radiologist Assistant, and communication documented in the PACS or zVision Dashboard. Electronically Signed   By: Norva PavlovElizabeth  Brown M.D.   On: 01/11/2018 08:00      Assessment/Plan Principal Problem:   Pulmonary embolus (HCC) Active Problems:   Hypoxia   AKI (acute kidney injury) (HCC)   Hypotension   Pulmonary embolism (HCC)  SBO - no hx of abdominal surgeries. Concerning for ischemic bowel in setting of newly diagnosed DVT/PE although Lactic acid WNL. Mild elevation in WBC. -  NGT placed and ordered SBO protocol with gastrografin.   FEN: NPO/NGT/IVF VTE: Xarelto ID: Zosyn 06/04>> Foley: none Follow up: TBD  Plan: SBO protocol, NGT, NPO. Exam is less concerning for ischemic bowel at this time. Lactic acid WNL and no signs of peritonitis. Okay for anticoagulation if Hg remains stable but would recommend heparin and holding Xarelto in the event the pt would need to go to surgery.   We will follow closely. Thank you for the consult.   Jerre SimonJessica L Everitt Wenner, [facility] 01/11/2018, 10:46 AM Pager: [phone] Consults: [phone] Mon-Fri 7:00 am-4:30 pm Sat-Sun 7:00 am-11:30 am

## 2018-01-11 NOTE — Consult Note (Addendum)
[facility] Consult: 12:26 PM 01/11/2018  LOS: 2 days    Referring Provider: Dr Blake Divine  Primary Care Physician:  Pa, [facility] Primary Gastroenterologist:  Gentry Fitz.      Reason for Consultation:  CG emesis. SBO.     HPI: Jason Myers is a 67 y.o. male.  PMH hemorrhagic CVA post ruptured aneurysm 1984. DVT, PE 2018.  On chronic Pradaxa.   HLD.  BPH.  Benign thyroid nodule on bx of 11/2016.   No GI hx or EGD/colonoscopy.     Admitted 4 d ago with acute onset of nausea, bilious vomiting, abdominal pain.  Previously patient had no issues with his GI tract, he ate well moved his bowels every day.  Was using 400 mg of ibuprofen about 4 days out of the week for musculoskeletal pain.  Prominent air-filled loops of bowel in the LUQ on initial films.   ~ 0540 this morning he started vomiting dark brown material, he says this was a pretty large volume of emesis.  X-ray was repeated, Xarelto was discontinued.  Fluid Gastroccult positive, however there was/is no obvious blood in the emesis.  Repeat abdominal films showed high-grade SBO.  NG tube was placed around 1145 by surgical PA, within 30 or 40 minutes it had put out 1 L.  The container already holding about 1 L (2nd liter) of dark but not grossly bloody fluid.  Hiccups started yesterday.  Patient is not having significant abdominal pain.  No BMs for the past couple of days.  He has never had abdominal or pelvic surgery and denies problems similar to this in the past.  Hgb 17.5 >> 17.1 >> 15.4 >> 15.9.   MCV 89.  Platelets, PT/INR normal.   BUN 29 >> 23.  AKI resolved.  LFTs, Lipase, lactic acid normal. 01/09/18 CT/CTA chest: RLL PE, possibly subacute given normal RV/LV ratio 01/09/18 CT ab/pelvis with contrast. "slightly unusual" but not ulcerated appearance to gastric  antrum.  Hazy ileum, ? Infectious vs inflammatory process?  Trace FF tracking from SB to right paracolic gutter.  Sigmoid diverticulosis.  Prostamegaly, bladder distended.    01/11/18 KUB with high grade SBO which were prominent but not obstructed on films of 6/3.      Past Medical History:  Diagnosis Date  . BPH (benign prostatic hyperplasia)   . CVA (cerebral vascular accident) (HCC) 1984   left side affected.    . Hypercholesterolemia     Past Surgical History:  Procedure Laterality Date  . LIPOMA EXCISION Left    groin    Prior to Admission medications   Medication Sig Start Date End Date Taking? Authorizing Provider  rosuvastatin (CRESTOR) 10 MG tablet Take 10 mg by mouth daily. 09/28/16  Yes [provider]  tamsulosin (FLOMAX) 0.4 MG CAPS capsule Take 0.4 mg by mouth at bedtime.  09/28/16  Yes [provider]  dabigatran (PRADAXA) 150 MG CAPS capsule Take 1 capsule (150 mg total) by mouth every 12 (twelve) hours. Patient not taking: Reported on 01/09/2018 11/18/16   Stephania Fragmin Chirag,  MD    Scheduled Meds: . diatrizoate meglumine-sodium  90 mL Per NG tube Once  . Rivaroxaban  15 mg Oral BID WC  . rosuvastatin  10 mg Oral Daily  . tamsulosin  0.4 mg Oral QHS   Infusions: . sodium chloride 1,000 mL (01/10/18 1703)  . piperacillin-tazobactam (ZOSYN)  IV Stopped (01/11/18 1143)   PRN Meds: acetaminophen **OR** acetaminophen, albuterol, morphine injection, ondansetron **OR** ondansetron (ZOFRAN) IV, zolpidem   Allergies as of 01/08/2018  . (No Known Allergies)    Family History  Problem Relation Age of Onset  . Alcoholism Mother   . Kidney Stones Father     Social History   Socioeconomic History  . Marital status: Single    Spouse name: Not on file  . Number of children: Not on file  . Years of education: Not on file  . Highest education level: Not on file  Occupational History  . Not on file  Social Needs  . Financial resource strain: Not  on file  . Food insecurity:    Worry: Not on file    Inability: Not on file  . Transportation needs:    Medical: Not on file    Non-medical: Not on file  Tobacco Use  . Smoking status: Former Games developer  . Smokeless tobacco: Never Used  Substance and Sexual Activity  . Alcohol use: Yes    Comment: occasionally  . Drug use: Yes    Types: Marijuana  . Sexual activity: Not on file  Lifestyle  . Physical activity:    Days per week: Not on file    Minutes per session: Not on file  . Stress: Not on file  Relationships  . Social connections:    Talks on phone: Not on file    Gets together: Not on file    Attends religious service: Not on file    Active member of club or organization: Not on file    Attends meetings of clubs or organizations: Not on file    Relationship status: Not on file  . Intimate partner violence:    Fear of current or ex partner: Not on file    Emotionally abused: Not on file    Physically abused: Not on file    Forced sexual activity: Not on file  Other Topics Concern  . Not on file  Social History Narrative  . Not on file    REVIEW OF SYSTEMS: Constitutional: With recent illness he is feeling weak and exhausted but normally has good strength. ENT:  No nose bleeds Pulm: Denies shortness of breath.  New cough. CV:  No palpitations, no LE edema.  GU:  No hematuria, no frequency GI:  Per HPI Heme: Denies excessive or unusual bleeding/bruising.  No history of anemia or requirements for B12, folate or iron supplements. Transfusions: Denies previous blood transfusions. Neuro:  No headaches, no peripheral tingling or numbness Derm:  No itching, no rash or sores.  Endocrine:  No sweats or chills.  No polyuria or dysuria Immunization: Did not inquire as to current or previous vaccinations. Travel:  None beyond local counties in last few months.    PHYSICAL EXAM: Vital signs in last 24 hours: Vitals:   01/11/18 0810 01/11/18 1202  BP: (!) 172/112 117/85    Pulse: 95 98  Resp: 17 18  Temp: 99.2 F (37.3 C) 97.8 F (36.6 C)  SpO2: 93% 91%   Wt Readings from Last 3 Encounters:  01/08/18 184 lb (83.5 kg)  11/18/16  180 lb 9.6 oz (81.9 kg)    General: Patient appears younger than stated age of 72.  He looks moderately ill and weak/tired. Head: No facial asymmetry or swelling.  No signs of head trauma. Eyes: No scleral icterus.  No conjunctival pallor. Ears: Not hard of hearing. Nose: No congestion, no discharge.  NG tube in place.  Effluent is deep brown, no gross blood. Mouth: Oropharynx slightly dry.  Tongue midline. Neck: No JVD, no bruits, no thyromegaly. Lungs: Clear bilaterally.  No labored breathing.  Occasional cough. Heart: RRR.  No MRG.  S1, S2 present. Abdomen: Not distended or tender.  Quiet bowel sounds.  No HSM, masses, bruits, hernias..   Rectal: Deferred Musc/Skeltl: No joint swelling, redness.  Moderate contracture in the left upper arm and left ankle. Extremities: Slight, nonpitting edema in the left ankle/foot. Neurologic:   Able to move his left limbs but strength diminished.  Fully alert, appropriate and oriented x 3.  Clear speech. Skin: No obvious sores, rashes or significant bruising. Nodes: No cervical adenopathy. Psych: Cooperative.  Affect flat.  Intake/Output from previous day: 06/05 0701 - 06/06 0700 In: 1990 [P.O.:240; I.V.:1600; IV Piggyback:150] Out: 750 [Urine:750] Intake/Output this shift: Total I/O In: -  Out: 125 [Urine:125]  LAB RESULTS: Recent Labs    01/09/18 0221 01/10/18 0344 01/11/18 0601  WBC 9.9 10.9* 13.4*  HGB 17.1* 15.4 15.9  HCT 51.9 47.1 48.5  PLT 226 189 226   BMET Lab Results  Component Value Date   NA 138 01/11/2018   NA 141 01/09/2018   NA 141 01/08/2018   K 3.5 01/11/2018   K 4.3 01/09/2018   K 4.5 01/08/2018   CL 101 01/11/2018   CL 112 (H) 01/09/2018   CL 107 01/08/2018   CO2 27 01/11/2018   CO2 20 (L) 01/09/2018   CO2 27 01/08/2018   GLUCOSE 137 (H)  01/11/2018   GLUCOSE 138 (H) 01/09/2018   GLUCOSE 145 (H) 01/08/2018   BUN 23 (H) 01/11/2018   BUN 28 (H) 01/09/2018   BUN 29 (H) 01/08/2018   CREATININE 0.90 01/11/2018   CREATININE 1.13 01/09/2018   CREATININE 1.42 (H) 01/08/2018   CALCIUM 8.8 (L) 01/11/2018   CALCIUM 8.8 (L) 01/09/2018   CALCIUM 9.5 01/08/2018   LFT Recent Labs    01/08/18 2205 01/11/18 0944  PROT 6.7 6.3*  ALBUMIN 3.9 2.9*  AST 18 19  ALT 12* 16*  ALKPHOS 52 54  BILITOT 0.7 1.0   PT/INR Lab Results  Component Value Date   INR 1.06 01/09/2018   Hepatitis Panel No results for input(s): HEPBSAG, HCVAB, HEPAIGM, HEPBIGM in the last 72 hours. C-Diff No components found for: CDIFF Lipase     Component Value Date/Time   LIPASE 38 01/08/2018 2205    Drugs of Abuse     Component Value Date/Time   LABOPIA NONE DETECTED 11/13/2016 0508   COCAINSCRNUR NONE DETECTED 11/13/2016 0508   LABBENZ NONE DETECTED 11/13/2016 0508   AMPHETMU NONE DETECTED 11/13/2016 0508   THCU POSITIVE (A) 11/13/2016 0508   LABBARB NONE DETECTED 11/13/2016 0508     RADIOLOGY STUDIES: Dg Abd Portable 1v-small Bowel Protocol-position Verification  Result Date: 01/11/2018 CLINICAL DATA:  Nasogastric tube placement. Small bowel obstruction. EXAM: PORTABLE ABDOMEN - 1 VIEW COMPARISON:  Study obtained earlier in the day FINDINGS: Nasogastric tube tip and side port are in the stomach. There remains generalized small bowel dilatation. No air-fluid levels. No free air evident. IMPRESSION: Nasogastric tube tip and  side port in stomach. Bowel gas pattern indicative of a degree of bowel obstruction evident and essentially stable. No evident free air on this supine examination. Electronically Signed   By: Bretta BangWilliam  Woodruff III M.D.   On: 01/11/2018 11:24   Dg Abd Portable 1v  Result Date: 01/11/2018 CLINICAL DATA:  Nausea/vomiting/Hematemesis ,abdomen distention EXAM: PORTABLE ABDOMEN - 1 VIEW COMPARISON:  CT of the abdomen and pelvis on  01/09/2018 FINDINGS: There is significant dilatation of small bowel loops throughout the abdomen, significantly increased compared to prior study. Large bowel loops are not dilated. No evidence for free intraperitoneal air on this supine view. Mild degenerative changes are seen in the lumbar spine. IMPRESSION: High-grade small bowel obstruction. These results will be called to the ordering clinician or representative by the Radiologist Assistant, and communication documented in the PACS or zVision Dashboard. Electronically Signed   By: Norva PavlovElizabeth  Brown M.D.   On: 01/11/2018 08:00      IMPRESSION:   *   SBO.  CT with abnormal appearance to the gastric antral mucosa and hazy ileum and associated free fluid tracking into the paracolic gutter suggests ileitis. Large volume NG tube output which is dark and heme positive.  The output looks less like a vigorous GI bleed and more like deep intestinal contents. Hgb has declined 1.6 g but he is nowhere near anemic.  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  (Possibly subacute) RLL PE on CTA 3 days ago.   Pradaxa on hold, Heparin IV in place.   *  Hx ruptured brain aneurysm, hemorrhagic CVA.       PLAN:     *   Not clear EGD indicated.  Dr Myrtie Neitheranis will see pt.   Start Pepcid 20 mg IV daily, already got 80 mg Protonix today. Surgery is following   Jason Myers  01/11/2018, 12:26 PM Phone [phone]  I have reviewed the entire case in detail with the above APP and discussed the plan in detail.  Therefore, I agree with the diagnoses recorded above. In addition,  I have personally interviewed and examined the patient and have personally reviewed any abdominal/pelvic CT scan images.  My additional thoughts are as follows:  High-grade small bowel obstruction, distended and tympanitic on exam.  He has a large amount of dark bilious output from the NG tube it is reportedly heme positive.  There is no overt GI bleeding.  His hemoglobin is  currently normal.  Although he is not having overt GI right now, I agree with the surgical service that he should he off oral anticoagulation for now mainly because he might yet need operative intervention.  I think it is fine to continue unfractionated heparin for now.  I have personally reviewed the images of his CT scan.  I think it is a very difficult call to make that there is any abnormality in the distal ileum even the noncontrast nature of the exam and the multiple dilated loops of small bowel.  It is not clear there is truly an abnormality of the distal stomach on this noncontrast study.  I am not planning an upper endoscopy on him at this point.  I will sign off and see him as the need arises.  I would like for him to refer to see us in clinic after discharge to see if he has any ongoing upper digestive symptoms and perhaps consider upper endoscopy at that point.  I feel that if  IV PPI is a short supply, he would be fine with IV Pepcid 40 mg twice daily  Charlie Pitter III [phone]

## 2018-01-11 NOTE — Progress Notes (Signed)
ANTICOAGULATION CONSULT NOTE - Follow-Up  Pharmacy Consult for Xarelto back to Heparin Indication: pulmonary embolus  No Known Allergies  Patient Measurements: Height: 6\' 1"  (185.4 cm) Weight: 184 lb (83.5 kg) IBW/kg (Calculated) : 79.9  Vital Signs: Temp: 97.8 F (36.6 C) (06/06 1202) Temp Source: Oral (06/06 1202) BP: 117/85 (06/06 1202) Pulse Rate: 98 (06/06 1202)  Labs: Recent Labs    01/08/18 2205 01/09/18 0221  01/09/18 1545 01/09/18 2252 01/10/18 0344 01/11/18 0601 01/11/18 0944  HGB 17.5* 17.1*  --   --   --  15.4 15.9  --   HCT 53.1* 51.9  --   --   --  47.1 48.5  --   PLT 286 226  --   --   --  189 226  --   LABPROT  --  13.8  --   --   --   --   --   --   INR  --  1.06  --   --   --   --   --   --   HEPARINUNFRC  --   --    < > 0.59 0.40 0.22*  --   --   CREATININE 1.42* 1.13  --   --   --   --   --  0.90   < > = values in this interval not displayed.    Estimated Creatinine Clearance: 88.8 mL/min (by C-G formula based on SCr of 0.9 mg/dL).   Medical History: Past Medical History:  Diagnosis Date  . BPH (benign prostatic hyperplasia)   . CVA (cerebral vascular accident) (HCC) 1984   left side affected.    . Hypercholesterolemia     Assessment: 67yo male c/o abdominal pain, CT reveals PE (possibly subacute) to begin heparin; of note, pt had PE in April 2018 tx'd w/ Pradaxa, which has since been d/c'd.  Transitioned to Xarelto 6/5, last dose 6/5 at 1700, now with new SBO, to transition back to heparin.  Will use PTT to guide heparin dosing until heparin level and PTT correlate.  Goal of Therapy:  Heparin level 0.3-0.7 units/ml Monitor platelets by anticoagulation protocol: Yes  aptt = 66 to 102 seconds   Plan:  Heparin at  1400 units/hr 8 hour heparin level, PTT  Thank you Okey RegalLisa Frederich Montilla, PharmD [phone]

## 2018-01-11 NOTE — Progress Notes (Signed)
PROGRESS NOTE    Jason Myers  ZOX:[id number] DOB: November 02, 1950 DOA: 01/08/2018 PCP: Gean BirchwoodPa, [facility]    Brief Narrative:  Jason Myers a 67 y.o.malewith medical history significant ofhemorrhagic strokes/pruptured aneurysm in 1984, history of DVT/PE, HLD, and BPH;who presents with complaints of generalized abdominal pain was found to have enteritis , was started on zosyn. He was also found to have acute PE/ DVT AND was started on IV heparin.  He was later on transitioned to oral xarelto for continued anticoagulation. Overnight, pt became nauseated, started vomiting black emesis, which was postive for blood. X  Ray of the abd showed high grade SBO. Surgery and GI consulted.    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  Echocardiogram done showed moderate reduction in the right ventricular systolic  Function. He is on RA with good sats.     Abdominal pain with nausea and vomiting. Worsened last night, with new high grade SBO.  NG tube placed and SURGERY consulted.  Meanwhile Gi consulted for evaluation of EGD for emesis positive for blood to rule out GI bleed.  xarelto changed to heparin as his hemoglobin is stable for now.    AKI:  Improved with hydration.  Repeat renal parameters in am.   Hyperlipidemia:  Resume crestor.    BPH:  On flomax.    Hypotension: on admission resolved.   DVT prophylaxis: heparin. Code Status:  Full code.  Family Communication: none at bedside.  Disposition Plan: pending resolution of the SBO   Consultants:   Gastroenterology  Surgery.     Procedures: echocardiogram      Antimicrobials:zosyn. Since admission.    Subjective: Vomited dark colored emesis earlier this am. Currently has an NG TUBE.   Objective: Vitals:   01/10/18 2328 01/11/18 0810 01/11/18 1202 01/11/18 1551  BP: (!) 146/104 (!) 172/112 117/85 (!) 128/95  Pulse: 93 95 98 94  Resp: 20 17 18 18   Temp: 98.2 F (36.8 C) 99.2 F (37.3 C) 97.8 F (36.6 C) 98.3 F (36.8 C)  TempSrc: Oral Oral Oral Oral  SpO2: 94% 93% 91% 91%  Weight:      Height:        Intake/Output Summary (Last 24 hours) at 01/11/2018 1618 Last data filed at 01/11/2018 0755 Gross per 24 hour  Intake 800 ml  Output 375 ml  Net 425 ml   Filed Weights   01/08/18 2345  Weight: 83.5 kg (184 lb)    Examination:  General exam: Appears in moderate distress from vomiting.  Respiratory system: Clear to auscultation. Respiratory effort normal. No wheezing orr honchi.  Cardiovascular system: S1 & S2 heard, RRR. No JVD, murmurs,  No pedal edema. Gastrointestinal system: Abdomen is distended, tender all over , bowel souds hyperactive.  Central nervous system: Alert and oriented. Non focal.  Extremities: Symmetric 5 x 5 power. Skin: No rashes, lesions or ulcers Psychiatry:  Mood & affect appropriate.     Data Reviewed: I have personally reviewed following labs and imaging studies  CBC: Recent Labs  Lab 01/08/18 2205 01/09/18 0221 01/10/18 0344 01/11/18 0601  WBC 7.9 9.9 10.9* 13.4*  HGB 17.5* 17.1* 15.4 15.9  HCT 53.1* 51.9 47.1 48.5  MCV 89.5 89.5 90.6 89.0  PLT 286 226 189 226   Basic Metabolic Panel: Recent Labs  Lab 01/08/18 2205 01/09/18 0221 01/11/18 0944  NA 141 141 138  K 4.5 4.3 3.5  CL 107 112* 101  CO2 27 20* 27  GLUCOSE 145* 138* 137*  BUN 29* 28* 23*  CREATININE 1.42* 1.13 0.90  CALCIUM 9.5 8.8* 8.8*   GFR: Estimated Creatinine Clearance: 88.8 mL/min (by C-G formula based on SCr of 0.9 mg/dL). Liver Function Tests: Recent Labs  Lab 01/08/18 2205 01/11/18 0944  AST 18 19   ALT 12* 16*  ALKPHOS 52 54  BILITOT 0.7 1.0  PROT 6.7 6.3*  ALBUMIN 3.9 2.9*   Recent Labs  Lab 01/08/18 2205  LIPASE 38   No results for input(s): AMMONIA in the last 168 hours. Coagulation Profile: Recent Labs  Lab 01/09/18 0221  INR 1.06   Cardiac Enzymes: No results for input(s): CKTOTAL, CKMB, CKMBINDEX, TROPONINI in the last 168 hours. BNP (last 3 results) No results for input(s): PROBNP in the last 8760 hours. HbA1C: No results for input(s): HGBA1C in the last 72 hours. CBG: No results for input(s): GLUCAP in the last 168 hours. Lipid Profile: No results for input(s): CHOL, HDL, LDLCALC, TRIG, CHOLHDL, LDLDIRECT in the last 72 hours. Thyroid Function Tests: No results for input(s): TSH, T4TOTAL, FREET4, T3FREE, THYROIDAB in the last 72 hours. Anemia Panel: No results for input(s): VITAMINB12, FOLATE, FERRITIN, TIBC, IRON, RETICCTPCT in the last 72 hours. Sepsis Labs: Recent Labs  Lab 01/08/18 2252 01/09/18 0037 01/11/18 0944  LATICACIDVEN 1.92* 1.67 1.4    Recent Results (from the past 240 hour(s))  Blood Culture (routine x 2)     Status: None (Preliminary result)   Collection Time: 01/09/18  2:15 AM  Result Value Ref Range Status   Specimen Description BLOOD RIGHT HAND  Final   Special Requests   Final    BOTTLES DRAWN AEROBIC AND ANAEROBIC Blood Culture results may not be optimal due to an inadequate volume of blood received in culture bottles   Culture   Final    NO GROWTH 2 DAYS Performed at [facility], [address], [place], [place] [address]    Report Status PENDING  Incomplete  Blood Culture (routine x 2)     Status: None (Preliminary result)   Collection Time: 01/09/18  2:20 AM  Result Value Ref Range Status   Specimen Description BLOOD RIGHT FOREARM  Final   Special Requests   Final    BOTTLES DRAWN AEROBIC AND ANAEROBIC Blood Culture adequate volume   Culture   Final    NO GROWTH 2 DAYS Performed at [facility],  [address] [place], [place] [address]    Report Status PENDING  Incomplete         Radiology Studies: Dg Abd Portable 1v-small Bowel Protocol-position Verification  Result Date: 01/11/2018 CLINICAL DATA:  Nasogastric tube placement. Small bowel obstruction. EXAM: PORTABLE ABDOMEN - 1 VIEW COMPARISON:  Study obtained earlier in the day FINDINGS: Nasogastric tube tip and side port are in the stomach. There remains generalized small bowel dilatation. No air-fluid levels. No free air evident. IMPRESSION: Nasogastric tube tip and side port in stomach. Bowel gas pattern indicative of a degree of bowel obstruction evident and essentially stable. No evident free air on this supine examination. Electronically Signed   By: Bretta Bang III M.D.  On: 01/11/2018 11:24   Dg Abd Portable 1v  Result Date: 01/11/2018 CLINICAL DATA:  Nausea/vomiting/Hematemesis ,abdomen distention EXAM: PORTABLE ABDOMEN - 1 VIEW COMPARISON:  CT of the abdomen and pelvis on 01/09/2018 FINDINGS: There is significant dilatation of small bowel loops throughout the abdomen, significantly increased compared to prior study. Large bowel loops are not dilated. No evidence for free intraperitoneal air on this supine view. Mild degenerative changes are seen in the lumbar spine. IMPRESSION: High-grade small bowel obstruction. These results will be called to the ordering clinician or representative by the Radiologist Assistant, and communication documented in the PACS or zVision Dashboard. Electronically Signed   By: Norva Pavlov M.D.   On: 01/11/2018 08:00        Scheduled Meds: . rosuvastatin  10 mg Oral Daily  . tamsulosin  0.4 mg Oral QHS   Continuous Infusions: . sodium chloride 1,000 mL (01/10/18 1703)  . famotidine (PEPCID) IV 20 mg (01/11/18 1554)  . heparin 1,400 Units/hr (01/11/18 1554)  . piperacillin-tazobactam (ZOSYN)  IV Stopped (01/11/18 1143)     LOS: 2 days    Time spent: 35 minutes.     Kathlen Mody, MD [facility] Pager [phone]   If 7PM-7AM, please contact night-coverage [email or web address] Password [email or web address] 01/11/2018, 4:18 PM

## 2018-01-12 ENCOUNTER — Inpatient Hospital Stay (HOSPITAL_COMMUNITY): Payer: Medicare HMO

## 2018-01-12 LAB — BASIC METABOLIC PANEL
Anion gap: 13 (ref 5–15)
BUN: 11 mg/dL (ref 6–20)
CALCIUM: 9.8 mg/dL (ref 8.9–10.3)
CO2: 25 mmol/L (ref 22–32)
CREATININE: 1.05 mg/dL (ref 0.61–1.24)
Chloride: 94 mmol/L — ABNORMAL LOW (ref 101–111)
GFR calc Af Amer: >60 mL/min (ref >60)
GFR calc non Af Amer: >60 mL/min (ref >60)
GLUCOSE: 167 mg/dL — AB (ref 65–99)
Potassium: 4.5 mmol/L (ref 3.5–5.1)
Sodium: 132 mmol/L — ABNORMAL LOW (ref 135–145)

## 2018-01-12 LAB — CBC
HCT: 43 % (ref 39.0–52.0)
Hemoglobin: 14.5 g/dL (ref 13.0–17.0)
MCH: 29.8 pg (ref 26.0–34.0)
MCHC: 33.7 g/dL (ref 30.0–36.0)
MCV: 88.3 fL (ref 78.0–100.0)
PLATELETS: 214 10*3/uL (ref 150–400)
RBC: 4.87 MIL/uL (ref 4.22–5.81)
RDW: 13.2 % (ref 11.5–15.5)
WBC: 9.2 10*3/uL (ref 4.0–10.5)

## 2018-01-12 LAB — HEMOGLOBIN AND HEMATOCRIT, BLOOD
HCT: 42.6 % (ref 39.0–52.0)
Hemoglobin: 13.9 g/dL (ref 13.0–17.0)

## 2018-01-12 LAB — HEPARIN LEVEL (UNFRACTIONATED)
HEPARIN UNFRACTIONATED: 1.48 [IU]/mL — AB (ref 0.30–0.70)
Heparin Unfractionated: 1.24 IU/mL — ABNORMAL HIGH (ref 0.30–0.70)

## 2018-01-12 LAB — APTT
APTT: 60 s — AB (ref 24–36)
aPTT: 63 seconds — ABNORMAL HIGH (ref 24–36)

## 2018-01-12 LAB — GLUCOSE, CAPILLARY
Glucose-Capillary: 83 mg/dL (ref 65–99)
Glucose-Capillary: 84 mg/dL (ref 65–99)

## 2018-01-12 MED ORDER — DEXTROSE-NACL 5-0.9 % IV SOLN
INTRAVENOUS | Status: AC
  Administered 2018-01-12 – 2018-01-14 (×5): via INTRAVENOUS

## 2018-01-12 NOTE — Progress Notes (Signed)
[facility] Progress Note      Assessment/Plan Principal Problem:   Pulmonary embolus (HCC) Active Problems:   Hypoxia   AKI (acute kidney injury) (HCC)   Hypotension   Pulmonary embolism (HCC)  SBO - no hx of abdominal surgeries. Concerning for ischemic bowel in setting of newly diagnosed DVT/PE although Lactic acid WNL. Mild elevation in WBC. - NGT placed and ordered SBO protocol with gastrografin.  - 8hr delay film showed persistent SBO with oral contrast still in the stomach  FEN: NPO/NGT/IVF VTE: Xarelto ID: Zosyn 06/04>> Foley: none Follow up: TBD  Plan: SBO protocol, NGT, NPO. Exam is less concerning for ischemic bowel at this time. Lactic acid WNL and no signs of peritonitis. Okay for anticoagulation if Hg remains stable but would recommend heparin and holding Xarelto in the event the pt would need to go to surgery. Repeat abdominal xray today at 1400.       LOS: 3 days    Subjective: CC: SBO  Pt had a small amount of flatus this am. He feels less bloated. Only mild abdominal pain. No new complaints.   Objective: Vital signs in last 24 hours: Temp:  [97.8 F (36.6 C)-98.7 F (37.1 C)] 98.7 F (37.1 C) (06/07 0844) Pulse Rate:  [89-98] 94 (06/07 0844) Resp:  [18-20] 18 (06/07 0844) BP: (117-144)/(85-96) 136/90 (06/07 0844) SpO2:  [91 %-94 %] 91 % (06/07 0844) Last BM Date: 01/08/18  Intake/Output from previous day: 06/06 0701 - 06/07 0700 In: 2763.4 [I.V.:2513.4; IV Piggyback:250] Out: 4000 [Urine:500; Emesis/NG output:3500] Intake/Output this shift: Total I/O In: -  Out: 100 [Urine:100]  PE: Gen:  Alert, NAD, pleasant, cooperative, non toxic appearing HEENT: NGT in place Pulm:  Rate and effort normal Abd: Soft, mild distention, hypoactive BS mixed with tinkling, mild generalized TTP without guarding and no signs of peritonitis  Skin: no rashes noted, warm and dry   Anti-infectives: Anti-infectives (From admission,  onward)   Start     Dose/Rate Route Frequency Ordered Stop   01/09/18 0800  piperacillin-tazobactam (ZOSYN) IVPB 3.375 g     3.375 g 12.5 mL/hr over 240 Minutes Intravenous Every 8 hours 01/09/18 0146     01/09/18 0145  piperacillin-tazobactam (ZOSYN) IVPB 3.375 g     3.375 g 100 mL/hr over 30 Minutes Intravenous  Once 01/09/18 0135 01/09/18 0303      Lab Results:  Recent Labs    01/11/18 0601 01/11/18 1914 01/12/18 0546  WBC 13.4*  --  9.2  HGB 15.9 14.4 14.5  HCT 48.5 43.5 43.0  PLT 226  --  214   BMET Recent Labs    01/11/18 0944  NA 138  K 3.5  CL 101  CO2 27  GLUCOSE 137*  BUN 23*  CREATININE 0.90  CALCIUM 8.8*   PT/INR No results for input(s): LABPROT, INR in the last 72 hours. CMP     Component Value Date/Time   NA 138 01/11/2018 0944   K 3.5 01/11/2018 0944   CL 101 01/11/2018 0944   CO2 27 01/11/2018 0944   GLUCOSE 137 (H) 01/11/2018 0944   BUN 23 (H) 01/11/2018 0944   CREATININE 0.90 01/11/2018 0944   CALCIUM 8.8 (L) 01/11/2018 0944   PROT 6.3 (L) 01/11/2018 0944   ALBUMIN 2.9 (L) 01/11/2018 0944   AST 19 01/11/2018 0944   ALT 16 (L) 01/11/2018 0944   ALKPHOS 54 01/11/2018 0944   BILITOT 1.0 01/11/2018 0944   GFRNONAA >60 01/11/2018 [id number]  GFRAA >60 01/11/2018 0944   Lipase     Component Value Date/Time   LIPASE 38 01/08/2018 2205    Studies/Results: Dg Abd Portable 1v-small Bowel Obstruction Protocol-initial, 8 Hr Delay  Result Date: 01/11/2018 CLINICAL DATA:  Small bowel obstruction. EXAM: PORTABLE ABDOMEN - 1 VIEW COMPARISON:  Abdominal x-ray from same day at 10:51 a.m. FINDINGS: Severely dilated loops of small bowel are unchanged. Partially visualized enteric tube and oral contrast within the stomach. Moderate stool in the right colon. No acute osseous abnormality. IMPRESSION: Unchanged severely dilated small bowel loops consistent with obstruction. Electronically Signed   By: Obie Dredge M.D.   On: 01/11/2018 23:53   Dg Abd  Portable 1v-small Bowel Protocol-position Verification  Result Date: 01/11/2018 CLINICAL DATA:  Nasogastric tube placement. Small bowel obstruction. EXAM: PORTABLE ABDOMEN - 1 VIEW COMPARISON:  Study obtained earlier in the day FINDINGS: Nasogastric tube tip and side port are in the stomach. There remains generalized small bowel dilatation. No air-fluid levels. No free air evident. IMPRESSION: Nasogastric tube tip and side port in stomach. Bowel gas pattern indicative of a degree of bowel obstruction evident and essentially stable. No evident free air on this supine examination. Electronically Signed   By: Bretta Bang III M.D.   On: 01/11/2018 11:24   Dg Abd Portable 1v  Result Date: 01/11/2018 CLINICAL DATA:  Nausea/vomiting/Hematemesis ,abdomen distention EXAM: PORTABLE ABDOMEN - 1 VIEW COMPARISON:  CT of the abdomen and pelvis on 01/09/2018 FINDINGS: There is significant dilatation of small bowel loops throughout the abdomen, significantly increased compared to prior study. Large bowel loops are not dilated. No evidence for free intraperitoneal air on this supine view. Mild degenerative changes are seen in the lumbar spine. IMPRESSION: High-grade small bowel obstruction. These results will be called to the ordering clinician or representative by the Radiologist Assistant, and communication documented in the PACS or zVision Dashboard. Electronically Signed   By: Norva Pavlov M.D.   On: 01/11/2018 08:00      Jerre Simon , [facility] Surgery 01/12/2018, 8:47 AM  Pager: [phone] Mon-Wed, Friday 7:00am-4:30pm Thurs 7am-11:30am  Consults: [phone]

## 2018-01-12 NOTE — Progress Notes (Signed)
ANTICOAGULATION CONSULT NOTE - Follow-Up  Pharmacy Consult for Xarelto to Heparin  Indication: pulmonary embolus  No Known Allergies  Patient Measurements: Height: 6\' 1"  (185.4 cm) Weight: 184 lb (83.5 kg) IBW/kg (Calculated) : 79.9  Vital Signs: Temp: 98 F (36.7 C) (06/07 0011) Temp Source: Oral (06/07 0011) BP: 144/96 (06/07 0011) Pulse Rate: 89 (06/07 0011)  Labs: Recent Labs    01/09/18 0221  01/09/18 2252 01/10/18 0344 01/11/18 0601 01/11/18 0944 01/11/18 1914 01/12/18 0027  HGB 17.1*  --   --  15.4 15.9  --  14.4  --   HCT 51.9  --   --  47.1 48.5  --  43.5  --   PLT 226  --   --  189 226  --   --   --   APTT  --   --   --   --   --   --   --  60*  LABPROT 13.8  --   --   --   --   --   --   --   INR 1.06  --   --   --   --   --   --   --   HEPARINUNFRC  --    < > 0.40 0.22*  --   --   --  1.48*  CREATININE 1.13  --   --   --   --  0.90  --   --    < > = values in this interval not displayed.    Estimated Creatinine Clearance: 88.8 mL/min (by C-G formula based on SCr of 0.9 mg/dL).   Medical History: Past Medical History:  Diagnosis Date  . BPH (benign prostatic hyperplasia)   . CVA (cerebral vascular accident) (HCC) 1984   left side affected.    . Hypercholesterolemia     Assessment: 67yo male c/o abdominal pain, CT reveals PE (possibly subacute) to begin heparin; of note, pt had PE in April 2018 tx'd w/ Pradaxa, which has since been d/c'd.  Transitioned to Xarelto 6/5, last dose 6/5 at 1700, now with new SBO, to transition back to heparin.  Will use PTT to guide heparin dosing until heparin level and PTT correlate due to Xarelto influence on heparin levels.   6/7 AM update: aptt is low, no issues per RN.   Goal of Therapy:  Heparin level 0.3-0.7 units/ml  APTT 66-102 Monitor platelets by anticoagulation protocol: Yes    Plan:  Inc heparin to 1550 units/hr 1000 aPTT/HL  Abran DukeJames Bryer Gottsch, PharmD, BCPS Clinical Pharmacist Phone:  [phone]

## 2018-01-12 NOTE — Progress Notes (Signed)
PROGRESS NOTE    Jason Myers  ZOX:[id number] DOB: [birth year] DOA: 01/08/2018 PCP: Gean Birchwood, [facility]    Brief Narrative:  Jason Myers a 67 y.o.malewith medical history significant ofhemorrhagic strokes/pruptured aneurysm in 1984, history of DVT/PE, HLD, and BPH;who presents with complaints of generalized abdominal pain was found to have enteritis , was started on zosyn. He was also found to have acute PE/ DVT AND was started on IV heparin.  He was later on transitioned to oral xarelto for continued anticoagulation. Overnight, pt became nauseated, started vomiting black emesis, which was postive for blood. X  Ray of the abd showed high grade SBO. Surgery and GI consulted.    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  Echocardiogram done showed moderate reduction in the right ventricular systolic function. He is on RA with good sats.  Currently he is on IV heparin as his hemoglobin is around 14.     Abdominal pain with nausea and vomiting. Worsened last night, with new high grade SBO.  NG tube placed and SURGERY consulted. Pt underwent SBO protocol and his abd x ray showed persistent small bowel loops.  Meanwhile Gi consulted for evaluation of EGD for emesis positive for blood to rule out GI bleed. GI recommended no work up at this time and signed off.  xarelto changed to heparin as his hemoglobin is stable for now.  As his lactic acid is wnl, and his abd symptoms are better, no signs of peritonitis, ischemic bowel less likely.    AKI:  Improved with hydration.  Creatinine at baseline.   Hyperlipidemia:  Resume crestor.    BPH:  On  flomax.    Hypotension: on admission resolved.   DVT prophylaxis: heparin. Code Status:  Full code.  Family Communication: none at bedside.  Disposition Plan: pending resolution of the SBO   Consultants:   Gastroenterology  Surgery.     Procedures: echocardiogram     Antimicrobials:zosyn. Since admission.    Subjective: Patient reports passing flatus, abd discomfort improved.   Objective: Vitals:   01/11/18 1202 01/11/18 1551 01/12/18 0011 01/12/18 0844  BP: 117/85 (!) 128/95 (!) 144/96 136/90  Pulse: 98 94 89 94  Resp: 18 18 20 18   Temp: 97.8 F (36.6 C) 98.3 F (36.8 C) 98 F (36.7 C) 98.7 F (37.1 C)  TempSrc: Oral Oral Oral Oral  SpO2: 91% 91% 94% 91%  Weight:      Height:        Intake/Output Summary (Last 24 hours) at 01/12/2018 1052 Last data filed at 01/12/2018 0740 Gross per 24 hour  Intake 2763.4 ml  Output 3975 ml  Net -1211.6 ml   Filed Weights   01/08/18 2345  Weight: 83.5 kg (184 lb)    Examination:  General exam: calm, not in distress.  Respiratory system: good air entry bilateral. No wheezing or rhonchi.  Cardiovascular system: S1 & S2 heard, RRR. No JVD, murmurs,  No pedal edema. Gastrointestinal system: Abdomen is soft non tender non distended bowel sounds hypoactive.  Central nervous system: Alert and oriented. Non focal.  Extremities: Symmetric 5 x 5 power. Skin:  No rashes, lesions or ulcers Psychiatry:  Mood & affect appropriate.     Data Reviewed: I have personally reviewed following labs and imaging studies  CBC: Recent Labs  Lab 01/08/18 2205 01/09/18 0221 01/10/18 0344 01/11/18 0601 01/11/18 1914 01/12/18 0546  WBC 7.9 9.9 10.9* 13.4*  --  9.2  HGB 17.5* 17.1* 15.4 15.9 14.4 14.5  HCT 53.1* 51.9 47.1 48.5 43.5 43.0  MCV 89.5 89.5 90.6 89.0  --  88.3  PLT 286 226 189 226  --  214   Basic Metabolic Panel: Recent Labs  Lab 01/08/18 2205 01/09/18 0221 01/11/18 0944  NA 141 141 138  K 4.5 4.3 3.5  CL 107  112* 101  CO2 27 20* 27  GLUCOSE 145* 138* 137*  BUN 29* 28* 23*  CREATININE 1.42* 1.13 0.90  CALCIUM 9.5 8.8* 8.8*   GFR: Estimated Creatinine Clearance: 88.8 mL/min (by C-G formula based on SCr of 0.9 mg/dL). Liver Function Tests: Recent Labs  Lab 01/08/18 2205 01/11/18 0944  AST 18 19  ALT 12* 16*  ALKPHOS 52 54  BILITOT 0.7 1.0  PROT 6.7 6.3*  ALBUMIN 3.9 2.9*   Recent Labs  Lab 01/08/18 2205  LIPASE 38   No results for input(s): AMMONIA in the last 168 hours. Coagulation Profile: Recent Labs  Lab 01/09/18 0221  INR 1.06   Cardiac Enzymes: No results for input(s): CKTOTAL, CKMB, CKMBINDEX, TROPONINI in the last 168 hours. BNP (last 3 results) No results for input(s): PROBNP in the last 8760 hours. HbA1C: No results for input(s): HGBA1C in the last 72 hours. CBG: Recent Labs  Lab 01/12/18 1048  GLUCAP 83   Lipid Profile: No results for input(s): CHOL, HDL, LDLCALC, TRIG, CHOLHDL, LDLDIRECT in the last 72 hours. Thyroid Function Tests: No results for input(s): TSH, T4TOTAL, FREET4, T3FREE, THYROIDAB in the last 72 hours. Anemia Panel: No results for input(s): VITAMINB12, FOLATE, FERRITIN, TIBC, IRON, RETICCTPCT in the last 72 hours. Sepsis Labs: Recent Labs  Lab 01/08/18 2252 01/09/18 0037 01/11/18 0944  LATICACIDVEN 1.92* 1.67 1.4    Recent Results (from the past 240 hour(s))  Blood Culture (routine x 2)     Status: None (Preliminary result)   Collection Time: 01/09/18  2:15 AM  Result Value Ref Range Status   Specimen Description BLOOD RIGHT HAND  Final   Special Requests   Final    BOTTLES DRAWN AEROBIC AND ANAEROBIC Blood Culture results may not be optimal due to an inadequate volume of blood received in culture bottles   Culture   Final    NO GROWTH 2 DAYS Performed at [facility], [address] [place], [place] [address]    Report Status PENDING  Incomplete  Blood Culture (routine x 2)     Status: None (Preliminary result)    Collection Time: 01/09/18  2:20 AM  Result Value Ref Range Status   Specimen Description BLOOD RIGHT FOREARM  Final   Special Requests   Final    BOTTLES DRAWN AEROBIC AND ANAEROBIC Blood Culture adequate volume   Culture   Final    NO GROWTH 2 DAYS Performed at [facility], [address] [place], [place] [address]    Report Status PENDING  Incomplete         Radiology Studies: Dg Abd Portable 1v-small Bowel Obstruction Protocol-initial, 8 Hr Delay  Result Date: 01/11/2018 CLINICAL DATA:  Small bowel obstruction. EXAM: PORTABLE ABDOMEN - 1 VIEW COMPARISON:  Abdominal x-ray from  same day at 10:51 a.m. FINDINGS: Severely dilated loops of small bowel are unchanged. Partially visualized enteric tube and oral contrast within the stomach. Moderate stool in the right colon. No acute osseous abnormality. IMPRESSION: Unchanged severely dilated small bowel loops consistent with obstruction. Electronically Signed   By: Obie Dredge M.D.   On: 01/11/2018 23:53   Dg Abd Portable 1v-small Bowel Protocol-position Verification  Result Date: 01/11/2018 CLINICAL DATA:  Nasogastric tube placement. Small bowel obstruction. EXAM: PORTABLE ABDOMEN - 1 VIEW COMPARISON:  Study obtained earlier in the day FINDINGS: Nasogastric tube tip and side port are in the stomach. There remains generalized small bowel dilatation. No air-fluid levels. No free air evident. IMPRESSION: Nasogastric tube tip and side port in stomach. Bowel gas pattern indicative of a degree of bowel obstruction evident and essentially stable. No evident free air on this supine examination. Electronically Signed   By: Bretta Bang III M.D.   On: 01/11/2018 11:24   Dg Abd Portable 1v  Result Date: 01/11/2018 CLINICAL DATA:  Nausea/vomiting/Hematemesis ,abdomen distention EXAM: PORTABLE ABDOMEN - 1 VIEW COMPARISON:  CT of the abdomen and pelvis on 01/09/2018 FINDINGS: There is significant dilatation of small bowel loops throughout the  abdomen, significantly increased compared to prior study. Large bowel loops are not dilated. No evidence for free intraperitoneal air on this supine view. Mild degenerative changes are seen in the lumbar spine. IMPRESSION: High-grade small bowel obstruction. These results will be called to the ordering clinician or representative by the Radiologist Assistant, and communication documented in the PACS or zVision Dashboard. Electronically Signed   By: Norva Pavlov M.D.   On: 01/11/2018 08:00        Scheduled Meds: . rosuvastatin  10 mg Oral Daily  . tamsulosin  0.4 mg Oral QHS   Continuous Infusions: . sodium chloride 1,000 mL (01/12/18 0036)  . famotidine (PEPCID) IV Stopped (01/11/18 2300)  . heparin 1,550 Units/hr (01/12/18 0622)  . piperacillin-tazobactam (ZOSYN)  IV 3.375 g (01/12/18 0742)     LOS: 3 days    Time spent: 35 minutes.     Kathlen Mody, MD [facility] Pager [phone]   If 7PM-7AM, please contact night-coverage [email or web address] Password [facility] 01/12/2018, 10:52 AM

## 2018-01-12 NOTE — Progress Notes (Signed)
ANTICOAGULATION CONSULT NOTE - Follow-Up  Pharmacy Consult for Xarelto back to Heparin Indication: pulmonary embolus  No Known Allergies  Patient Measurements: Height: 6\' 1"  (185.4 cm) Weight: 184 lb (83.5 kg) IBW/kg (Calculated) : 79.9  Vital Signs: Temp: 98.7 F (37.1 C) (06/07 0844) Temp Source: Oral (06/07 0844) BP: 136/90 (06/07 0844) Pulse Rate: 94 (06/07 0844)  Labs: Recent Labs    01/10/18 0344 01/11/18 0601 01/11/18 0944 01/11/18 1914 01/12/18 0027 01/12/18 0546 01/12/18 1027  HGB 15.4 15.9  --  14.4  --  14.5  --   HCT 47.1 48.5  --  43.5  --  43.0  --   PLT 189 226  --   --   --  214  --   APTT  --   --   --   --  60*  --  63*  HEPARINUNFRC 0.22*  --   --   --  1.48*  --  1.24*  CREATININE  --   --  0.90  --   --   --   --     Estimated Creatinine Clearance: 88.8 mL/min (by C-G formula based on SCr of 0.9 mg/dL).   Medical History: Past Medical History:  Diagnosis Date  . BPH (benign prostatic hyperplasia)   . CVA (cerebral vascular accident) (HCC) 1984   left side affected.    . Hypercholesterolemia     Assessment: 67yo male c/o abdominal pain, CT reveals PE (possibly subacute) to begin heparin; of note, pt had PE in April 2018 tx'd w/ Pradaxa, which has since been d/c'd.  Transitioned to Xarelto 6/5, last dose 6/5 at 1700, now with new SBO, to transition back to heparin.  Will use PTT to guide heparin dosing until heparin level and PTT correlate.  PTT = 63 seconds this PM  Goal of Therapy:  Heparin level 0.3-0.7 units/ml Monitor platelets by anticoagulation protocol: Yes  aptt = 66 to 102 seconds   Plan:  Increase heparin to 1700 units / hr Follow up AM labs  Thank you Okey RegalLisa Arlita Buffkin, PharmD [phone]

## 2018-01-12 NOTE — Care Management Important Message (Signed)
Important Message  Patient Details  Name: Jason Myers MRN: [id number] Date of Birth: 1951-01-12   Medicare Important Message Given:  Yes    Amirrah Quigley Stefan ChurchBratton 01/12/2018, 3:31 PM

## 2018-01-13 ENCOUNTER — Inpatient Hospital Stay (HOSPITAL_COMMUNITY): Payer: Medicare HMO

## 2018-01-13 LAB — HEPARIN LEVEL (UNFRACTIONATED)
Heparin Unfractionated: 0.71 IU/mL — ABNORMAL HIGH (ref 0.30–0.70)
Heparin Unfractionated: 0.33 IU/mL (ref 0.30–0.70)

## 2018-01-13 LAB — CBC
HEMATOCRIT: 41.7 % (ref 39.0–52.0)
HEMOGLOBIN: 13.7 g/dL (ref 13.0–17.0)
MCH: 29.3 pg (ref 26.0–34.0)
MCHC: 32.9 g/dL (ref 30.0–36.0)
MCV: 89.1 fL (ref 78.0–100.0)
Platelets: 204 10*3/uL (ref 150–400)
RBC: 4.68 MIL/uL (ref 4.22–5.81)
RDW: 13.3 % (ref 11.5–15.5)
WBC: 10 10*3/uL (ref 4.0–10.5)

## 2018-01-13 LAB — HEMOGLOBIN AND HEMATOCRIT, BLOOD
HEMATOCRIT: 40.8 % (ref 39.0–52.0)
HEMOGLOBIN: 13.4 g/dL (ref 13.0–17.0)

## 2018-01-13 LAB — APTT
APTT: 67 s — AB (ref 24–36)
aPTT: 102 seconds — ABNORMAL HIGH (ref 24–36)

## 2018-01-13 NOTE — Progress Notes (Addendum)
ANTICOAGULATION CONSULT NOTE - Follow-Up  Pharmacy Consult for Xarelto back to Heparin Indication: pulmonary embolus  No Known Allergies  Patient Measurements: Height: 6\' 1"  (185.4 cm) Weight: 184 lb (83.5 kg) IBW/kg (Calculated) : 79.9  Vital Signs: Temp: 98 F (36.7 C) (06/08 0801) Temp Source: Oral (06/08 0801) BP: 134/99 (06/08 0801) Pulse Rate: 83 (06/08 0801)  Labs: Recent Labs    01/11/18 0601 01/11/18 0944  01/12/18 0027 01/12/18 0546 01/12/18 1027 01/12/18 1844 01/13/18 0515  HGB 15.9  --    < >  --  14.5  --  13.9 13.7  HCT 48.5  --    < >  --  43.0  --  42.6 41.7  PLT 226  --   --   --  214  --   --  204  APTT  --   --   --  60*  --  63*  --  102*  HEPARINUNFRC  --   --   --  1.48*  --  1.24*  --  0.71*  CREATININE  --  0.90  --   --  1.05  --   --   --    < > = values in this interval not displayed.    Estimated Creatinine Clearance: 76.1 mL/min (by C-G formula based on SCr of 1.05 mg/dL).   Medical History: Past Medical History:  Diagnosis Date  . BPH (benign prostatic hyperplasia)   . CVA (cerebral vascular accident) (HCC) 1984   left side affected.    . Hypercholesterolemia     Assessment: 67yo male c/o abdominal pain, CT reveals PE (possibly subacute) to begin heparin; of note, pt had PE in April 2018 tx'd w/ Pradaxa, which has since been d/c'd.  Transitioned to Xarelto 6/5, last dose 6/5 at 1700, now with new SBO, to transition back to heparin.  Will use PTT to guide heparin dosing until heparin level and PTT correlate.  Heparin level was 0.71, aPTT was 102 - on upper end of goal range. Hgb 13.7, plt 204. No s/sx of bleeding. No infusion issues.  Goal of Therapy:  aPTT 66 to 102 seconds Heparin level 0.3-0.7 units/ml Monitor platelets by anticoagulation protocol: Yes    Plan:  Decrease heparin infusion at 16500 units/hr to keep in goal range Will get 6 hour aPTT to confirm within goal range Monitor daily HL/aPTT, CBC, for s/sx of  bleeding  Thank you,  Girard CooterKimberly Perkins, PharmD Clinical Pharmacist  Pager: [phone] Phone: [phone]  ADDENDUM Heparin level came back at 0.33 and aPTT came back therapeutic at 67, on 1650 units/hr. Levels correlating so will start following heparin levels. Continue at same rate. No s/sx of bleeding. No infusion issues.  Girard CooterKimberly Perkins, PharmD Clinical Pharmacist  Pager: [phone] Phone: [phone]

## 2018-01-13 NOTE — Progress Notes (Signed)
The patient had a short run of SVT with rate reaching the 140's, the patient was asymptomatic, have notified the on call provider. The patient 's heart rate has returned to NSR with rate in the 80s; will monitor.

## 2018-01-13 NOTE — Progress Notes (Signed)
PROGRESS NOTE    Jason Myers  EAV:[id number] DOB: 05-29-1951 DOA: 01/08/2018 PCP: Gean BirchwoodPa, [facility]    Brief Narrative:  Jason Myers a 67 y.o.malewith medical history significant ofhemorrhagic strokes/pruptured aneurysm in 1984, history of DVT/PE, HLD, and BPH;who presents with complaints of generalized abdominal pain was found to have enteritis , was started on zosyn. He was also found to have acute PE/ DVT AND was started on IV heparin.  He was later on transitioned to oral xarelto for continued anticoagulation. Overnight, pt became nauseated, started vomiting black emesis, which was postive for blood. X  Ray of the abd showed high grade SBO. Surgery and GI consulted.    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  Echocardiogram done showed moderate reduction in the right ventricular systolic function. He is on RA with good sats.  Currently he is on IV heparin and his hemoglobin did drop from 15 to 13.7.     Abdominal pain with nausea and vomiting.  abd x ray showing  high grade SBO.  NG tube placed and surgery consulted. Pt underwent SBO protocol and his abd x ray showed persistent small bowel loops. Repeat abd film yesterday showed persistent dilated bowel loops. But after that pt started passing flatus, had 2 bowel movements. He is requesting to eat atleast liquid diet. Will start with sips of water and ice chips.  Meanwhile Gi consulted for evaluation of EGD for emesis positive for blood to rule out GI bleed. GI recommended no work up at this time and signed off.  xarelto changed to heparin, in case he  needs surgery for his bowel obstruction.   As his lactic acid is wnl, and his abd symptoms are better, no signs of peritonitis, ischemic bowel less likely.    AKI:  Improved with hydration.  Creatinine at baseline.   Hyperlipidemia:  Resume crestor.    BPH:  On flomax.    Hypotension: on admission resolved.   DVT prophylaxis: heparin. Code Status:  Full code.  Family Communication: none at bedside.  Disposition Plan: pending resolution of the SBO   Consultants:   Gastroenterology  Surgery.     Procedures: echocardiogram     Antimicrobials:zosyn. Since admission.    Subjective:  Requesting liquid diet   Objective: Vitals:   01/12/18 1613 01/12/18 2315 01/12/18 2357 01/13/18 0801  BP: (!) 142/99  134/87 (!) 134/99  Pulse: 93  88 83  Resp: 18  18   Temp: 99.2 F (37.3 C)  98.1 F (36.7 C) 98 F (36.7 C)  TempSrc: Oral  Oral Oral  SpO2: 91% 94% 97% 98%  Weight:      Height:        Intake/Output Summary (Last 24 hours) at 01/13/2018 0835 Last data filed at 01/13/2018 0715 Gross per 24 hour  Intake 2387 ml  Output 1675 ml  Net 712 ml   Filed Weights   01/08/18 2345  Weight: 83.5 kg (184 lb)    Examination:  General exam: appears calm and comfortable. Not in any distress.  Respiratory system: diminished air entry at  bases, no wheezing or rhonchi.  Cardiovascular system: S1 & S2 heard, RRR. No JVD, murmurs,  No pedal edema. Gastrointestinal system: Abdomen is soft , NT MILDLY distended bowel sounds good.  Central nervous system: Alert and oriented. Non focal.  Extremities: Symmetric 5 x 5 power. Skin: No rashes, lesions or ulcers Psychiatry:  Mood & affect appropriate.     Data Reviewed: I have personally reviewed following labs and imaging studies  CBC: Recent Labs  Lab 01/09/18 0221 01/10/18 0344 01/11/18 0601 01/11/18 1914 01/12/18 0546 01/12/18 1844 01/13/18 0515  WBC 9.9 10.9* 13.4*  --  9.2  --  10.0  HGB 17.1* 15.4 15.9 14.4  14.5 13.9 13.7  HCT 51.9 47.1 48.5 43.5 43.0 42.6 41.7  MCV 89.5 90.6 89.0  --  88.3  --  89.1  PLT 226 189 226  --  214  --  204   Basic Metabolic Panel: Recent Labs  Lab 01/08/18 2205 01/09/18 0221 01/11/18 0944 01/12/18 0546  NA 141 141 138 132*  K 4.5 4.3 3.5 4.5  CL 107 112* 101 94*  CO2 27 20* 27 25  GLUCOSE 145* 138* 137* 167*  BUN 29* 28* 23* 11  CREATININE 1.42* 1.13 0.90 1.05  CALCIUM 9.5 8.8* 8.8* 9.8   GFR: Estimated Creatinine Clearance: 76.1 mL/min (by C-G formula based on SCr of 1.05 mg/dL). Liver Function Tests: Recent Labs  Lab 01/08/18 2205 01/11/18 0944  AST 18 19  ALT 12* 16*  ALKPHOS 52 54  BILITOT 0.7 1.0  PROT 6.7 6.3*  ALBUMIN 3.9 2.9*   Recent Labs  Lab 01/08/18 2205  LIPASE 38   No results for input(s): AMMONIA in the last 168 hours. Coagulation Profile: Recent Labs  Lab 01/09/18 0221  INR 1.06   Cardiac Enzymes: No results for input(s): CKTOTAL, CKMB, CKMBINDEX, TROPONINI in the last 168 hours. BNP (last 3 results) No results for input(s): PROBNP in the last 8760 hours. HbA1C: No results for input(s): HGBA1C in the last 72 hours. CBG: Recent Labs  Lab 01/12/18 1048 01/12/18 1153  GLUCAP 83 84   Lipid Profile: No results for input(s): CHOL, HDL, LDLCALC, TRIG, CHOLHDL, LDLDIRECT in the last 72 hours. Thyroid Function Tests: No results for input(s): TSH, T4TOTAL, FREET4, T3FREE, THYROIDAB in the last 72 hours. Anemia Panel: No results for input(s): VITAMINB12, FOLATE, FERRITIN, TIBC, IRON, RETICCTPCT in the last 72 hours. Sepsis Labs: Recent Labs  Lab 01/08/18 2252 01/09/18 0037 01/11/18 0944  LATICACIDVEN 1.92* 1.67 1.4    Recent Results (from the past 240 hour(s))  Blood Culture (routine x 2)     Status: None (Preliminary result)   Collection Time: 01/09/18  2:15 AM  Result Value Ref Range Status   Specimen Description BLOOD RIGHT HAND  Final   Special Requests   Final    BOTTLES DRAWN AEROBIC AND ANAEROBIC  Blood Culture results may not be optimal due to an inadequate volume of blood received in culture bottles   Culture   Final    NO GROWTH 3 DAYS Performed at [facility], [address] [place], [place] [address]    Report Status PENDING  Incomplete  Blood Culture (routine x 2)     Status: None (Preliminary result)   Collection Time: 01/09/18  2:20 AM  Result Value Ref Range Status   Specimen Description BLOOD RIGHT FOREARM  Final   Special Requests   Final    BOTTLES DRAWN AEROBIC AND ANAEROBIC Blood Culture adequate volume  Culture   Final    NO GROWTH 3 DAYS Performed at [facility], [address], [place], [place] [address]    Report Status PENDING  Incomplete         Radiology Studies: Dg Abd Portable 1v  Result Date: 01/12/2018 CLINICAL DATA:  Small bowel obstruction EXAM: PORTABLE ABDOMEN - 1 VIEW COMPARISON:  01/11/2018 abdominal radiograph FINDINGS: Enteric tube terminates in proximal stomach. Marked dilatation of small bowel loops throughout the abdomen, not appreciably changed. Moderate colonic stool volume. No evidence of pneumatosis or pneumoperitoneum. No radiopaque nephrolithiasis. Moderate lumbar spondylosis. IMPRESSION: No appreciable change in marked dilatation of small bowel loops throughout the abdomen compatible with distal small bowel obstruction. Electronically Signed   By: Delbert Phenix M.D.   On: 01/12/2018 15:34   Dg Abd Portable 1v-small Bowel Obstruction Protocol-initial, 8 Hr Delay  Result Date: 01/11/2018 CLINICAL DATA:  Small bowel obstruction. EXAM: PORTABLE ABDOMEN - 1 VIEW COMPARISON:  Abdominal x-ray from same day at 10:51 a.m. FINDINGS: Severely dilated loops of small bowel are unchanged. Partially visualized enteric tube and oral contrast within the stomach. Moderate stool in the right colon. No acute osseous abnormality. IMPRESSION: Unchanged severely dilated small bowel loops consistent with obstruction. Electronically Signed   By:  Obie Dredge M.D.   On: 01/11/2018 23:53   Dg Abd Portable 1v-small Bowel Protocol-position Verification  Result Date: 01/11/2018 CLINICAL DATA:  Nasogastric tube placement. Small bowel obstruction. EXAM: PORTABLE ABDOMEN - 1 VIEW COMPARISON:  Study obtained earlier in the day FINDINGS: Nasogastric tube tip and side port are in the stomach. There remains generalized small bowel dilatation. No air-fluid levels. No free air evident. IMPRESSION: Nasogastric tube tip and side port in stomach. Bowel gas pattern indicative of a degree of bowel obstruction evident and essentially stable. No evident free air on this supine examination. Electronically Signed   By: Bretta Bang III M.D.   On: 01/11/2018 11:24        Scheduled Meds: . rosuvastatin  10 mg Oral Daily  . tamsulosin  0.4 mg Oral QHS   Continuous Infusions: . dextrose 5 % and 0.9% NaCl 50 mL/hr at 01/13/18 0814  . famotidine (PEPCID) IV Stopped (01/12/18 2154)  . heparin 1,700 Units/hr (01/13/18 0818)  . piperacillin-tazobactam (ZOSYN)  IV 3.375 g (01/13/18 0815)     LOS: 4 days    Time spent: 35 minutes.     Kathlen Mody, MD [facility] Pager [phone]   If 7PM-7AM, please contact night-coverage [email or web address] Password [facility] 01/13/2018, 8:35 AM

## 2018-01-13 NOTE — Progress Notes (Signed)
Patient ID: Jason Myers, male   DOB: [birth year], 67 y.o.   MRN: [id number]       Subjective: Patient frustrated about not being able to eat.  Did have some flatus and a BM yesterday.  Still with bilious NGT output.  Objective: Vital signs in last 24 hours: Temp:  [98 F (36.7 C)-99.2 F (37.3 C)] 98 F (36.7 C) (06/08 0801) Pulse Rate:  [83-93] 83 (06/08 0801) Resp:  [18] 18 (06/07 2357) BP: (134-142)/(87-99) 134/99 (06/08 0801) SpO2:  [91 %-98 %] 98 % (06/08 0801) Last BM Date: 01/08/18  Intake/Output from previous day: 06/07 0701 - 06/08 0700 In: 2487 [I.V.:2287; IV Piggyback:200] Out: 1625 [Urine:875; Emesis/NG output:750] Intake/Output this shift: Total I/O In: -  Out: 225 [Urine:75; Emesis/NG output:150]  PE: Abd: soft, but still seems a bit distended, hypoactive BS, NGT with bilious output, 750cc yesterday.  nontender  Lab Results:  Recent Labs    01/12/18 0546 01/12/18 1844 01/13/18 0515  WBC 9.2  --  10.0  HGB 14.5 13.9 13.7  HCT 43.0 42.6 41.7  PLT 214  --  204   BMET Recent Labs    01/11/18 0944 01/12/18 0546  NA 138 132*  K 3.5 4.5  CL 101 94*  CO2 27 25  GLUCOSE 137* 167*  BUN 23* 11  CREATININE 0.90 1.05  CALCIUM 8.8* 9.8   PT/INR No results for input(s): LABPROT, INR in the last 72 hours. CMP     Component Value Date/Time   NA 132 (L) 01/12/2018 0546   K 4.5 01/12/2018 0546   CL 94 (L) 01/12/2018 0546   CO2 25 01/12/2018 0546   GLUCOSE 167 (H) 01/12/2018 0546   BUN 11 01/12/2018 0546   CREATININE 1.05 01/12/2018 0546   CALCIUM 9.8 01/12/2018 0546   PROT 6.3 (L) 01/11/2018 0944   ALBUMIN 2.9 (L) 01/11/2018 0944   AST 19 01/11/2018 0944   ALT 16 (L) 01/11/2018 0944   ALKPHOS 54 01/11/2018 0944   BILITOT 1.0 01/11/2018 0944   GFRNONAA >60 01/12/2018 0546   GFRAA >60 01/12/2018 0546   Lipase     Component Value Date/Time   LIPASE 38 01/08/2018 2205       Studies/Results: Dg Abd Portable 1v  Result Date:  01/12/2018 CLINICAL DATA:  Small bowel obstruction EXAM: PORTABLE ABDOMEN - 1 VIEW COMPARISON:  01/11/2018 abdominal radiograph FINDINGS: Enteric tube terminates in proximal stomach. Marked dilatation of small bowel loops throughout the abdomen, not appreciably changed. Moderate colonic stool volume. No evidence of pneumatosis or pneumoperitoneum. No radiopaque nephrolithiasis. Moderate lumbar spondylosis. IMPRESSION: No appreciable change in marked dilatation of small bowel loops throughout the abdomen compatible with distal small bowel obstruction. Electronically Signed   By: Delbert Phenix M.D.   On: 01/12/2018 15:34   Dg Abd Portable 1v-small Bowel Obstruction Protocol-initial, 8 Hr Delay  Result Date: 01/11/2018 CLINICAL DATA:  Small bowel obstruction. EXAM: PORTABLE ABDOMEN - 1 VIEW COMPARISON:  Abdominal x-ray from same day at 10:51 a.m. FINDINGS: Severely dilated loops of small bowel are unchanged. Partially visualized enteric tube and oral contrast within the stomach. Moderate stool in the right colon. No acute osseous abnormality. IMPRESSION: Unchanged severely dilated small bowel loops consistent with obstruction. Electronically Signed   By: Obie Dredge M.D.   On: 01/11/2018 23:53   Dg Abd Portable 1v-small Bowel Protocol-position Verification  Result Date: 01/11/2018 CLINICAL DATA:  Nasogastric tube placement. Small bowel obstruction. EXAM: PORTABLE ABDOMEN - 1 VIEW COMPARISON:  Study  obtained earlier in the day FINDINGS: Nasogastric tube tip and side port are in the stomach. There remains generalized small bowel dilatation. No air-fluid levels. No free air evident. IMPRESSION: Nasogastric tube tip and side port in stomach. Bowel gas pattern indicative of a degree of bowel obstruction evident and essentially stable. No evident free air on this supine examination. Electronically Signed   By: Bretta BangWilliam  Woodruff III M.D.   On: 01/11/2018 11:24    Anti-infectives: Anti-infectives (From admission,  onward)   Start     Dose/Rate Route Frequency Ordered Stop   01/09/18 0800  piperacillin-tazobactam (ZOSYN) IVPB 3.375 g     3.375 g 12.5 mL/hr over 240 Minutes Intravenous Every 8 hours 01/09/18 0146     01/09/18 0145  piperacillin-tazobactam (ZOSYN) IVPB 3.375 g     3.375 g 100 mL/hr over 30 Minutes Intravenous  Once 01/09/18 0135 01/09/18 0303       Assessment/Plan Principal Problem: Pulmonary embolus (HCC) Active Problems: Hypoxia AKI (acute kidney injury) (HCC) Hypotension Pulmonary embolism (HCC)  SBO - no hx of abdominal surgeries.  -NGT with bilious output, but did have some bowel function.   -films yesterday still show significant SBO -repeat films in am, but needs to keep NGT today.  FEN: NPO/NGT/IVF VTE: Xarelto on hold/heparin drip ID: Zosyn 06/04>> Foley: none Follow up:TBD    LOS: 4 days    Letha CapeKelly E Amisha Pospisil , [facility] 01/13/2018, 9:51 AM Pager: [phone]

## 2018-01-14 ENCOUNTER — Inpatient Hospital Stay (HOSPITAL_COMMUNITY): Payer: Medicare HMO

## 2018-01-14 LAB — BASIC METABOLIC PANEL
Anion gap: 9 (ref 5–15)
BUN: 10 mg/dL (ref 6–20)
CALCIUM: 7.8 mg/dL — AB (ref 8.9–10.3)
CO2: 27 mmol/L (ref 22–32)
Chloride: 105 mmol/L (ref 101–111)
Creatinine, Ser: 0.78 mg/dL (ref 0.61–1.24)
GFR calc Af Amer: >60 mL/min (ref >60)
Glucose, Bld: 110 mg/dL — ABNORMAL HIGH (ref 65–99)
Potassium: 3.1 mmol/L — ABNORMAL LOW (ref 3.5–5.1)
SODIUM: 141 mmol/L (ref 135–145)

## 2018-01-14 LAB — HEMOGLOBIN AND HEMATOCRIT, BLOOD
HEMATOCRIT: 44.7 % (ref 39.0–52.0)
HEMOGLOBIN: 14.6 g/dL (ref 13.0–17.0)

## 2018-01-14 LAB — HEPARIN LEVEL (UNFRACTIONATED): HEPARIN UNFRACTIONATED: 0.38 [IU]/mL (ref 0.30–0.70)

## 2018-01-14 LAB — CULTURE, BLOOD (ROUTINE X 2)
Culture: NO GROWTH
Culture: NO GROWTH
Special Requests: ADEQUATE

## 2018-01-14 LAB — CBC
HCT: 39.8 % (ref 39.0–52.0)
Hemoglobin: 13.1 g/dL (ref 13.0–17.0)
MCH: 29.1 pg (ref 26.0–34.0)
MCHC: 32.9 g/dL (ref 30.0–36.0)
MCV: 88.4 fL (ref 78.0–100.0)
Platelets: 216 10*3/uL (ref 150–400)
RBC: 4.5 MIL/uL (ref 4.22–5.81)
RDW: 13.3 % (ref 11.5–15.5)
WBC: 9.1 10*3/uL (ref 4.0–10.5)

## 2018-01-14 LAB — APTT: aPTT: 78 seconds — ABNORMAL HIGH (ref 24–36)

## 2018-01-14 LAB — MAGNESIUM: Magnesium: 2 mg/dL (ref 1.7–2.4)

## 2018-01-14 MED ORDER — POTASSIUM CHLORIDE 10 MEQ/100ML IV SOLN
10.0000 meq | INTRAVENOUS | Status: AC
  Administered 2018-01-14 (×3): 10 meq via INTRAVENOUS
  Filled 2018-01-14 (×3): qty 100

## 2018-01-14 MED ORDER — POTASSIUM CHLORIDE 10 MEQ/100ML IV SOLN: 10.0000 meq | INTRAVENOUS | Status: AC

## 2018-01-14 NOTE — Progress Notes (Signed)
Patient ID: Jason Myers, male   DOB: 12-17-1950, 67 y.o.   MRN: [id number]       Subjective: Pt still passing some flatus.  No further BMs since yesterday.  Taking in some ice, but still with 950cc of NGT output yesterday.  Objective: Vital signs in last 24 hours: Temp:  [97.6 F (36.4 C)] 97.6 F (36.4 C) (06/08 2305) Pulse Rate:  [80-85] 80 (06/08 2305) Resp:  [14-18] 14 (06/08 2305) BP: (120-131)/(85-88) 131/85 (06/08 2305) SpO2:  [89 %-97 %] 97 % (06/08 2305) Last BM Date: 01/13/18  Intake/Output from previous day: 06/08 0701 - 06/09 0700 In: 1835.7 [P.O.:90; I.V.:1465.7; NG/GT:30; IV Piggyback:250] Out: 1350 [Urine:400; Emesis/NG output:950] Intake/Output this shift: No intake/output data recorded.  PE: Abd: soft, but still a bit bloated, some BS, NGT with bilious output, minimally tender to palpation.  Lab Results:  Recent Labs    01/13/18 0515 01/13/18 1720 01/14/18 0210  WBC 10.0  --  9.1  HGB 13.7 13.4 13.1  HCT 41.7 40.8 39.8  PLT 204  --  216   BMET Recent Labs    01/12/18 0546 01/14/18 0210  NA 132* 141  K 4.5 3.1*  CL 94* 105  CO2 25 27  GLUCOSE 167* 110*  BUN 11 10  CREATININE 1.05 0.78  CALCIUM 9.8 7.8*   PT/INR No results for input(s): LABPROT, INR in the last 72 hours. CMP     Component Value Date/Time   NA 141 01/14/2018 0210   K 3.1 (L) 01/14/2018 0210   CL 105 01/14/2018 0210   CO2 27 01/14/2018 0210   GLUCOSE 110 (H) 01/14/2018 0210   BUN 10 01/14/2018 0210   CREATININE 0.78 01/14/2018 0210   CALCIUM 7.8 (L) 01/14/2018 0210   PROT 6.3 (L) 01/11/2018 0944   ALBUMIN 2.9 (L) 01/11/2018 0944   AST 19 01/11/2018 0944   ALT 16 (L) 01/11/2018 0944   ALKPHOS 54 01/11/2018 0944   BILITOT 1.0 01/11/2018 0944   GFRNONAA >60 01/14/2018 0210   GFRAA >60 01/14/2018 0210   Lipase     Component Value Date/Time   LIPASE 38 01/08/2018 2205       Studies/Results: Dg Abd Portable 1v  Result Date: 01/13/2018 CLINICAL DATA:  Small  bowel obstruction EXAM: PORTABLE ABDOMEN - 1 VIEW COMPARISON:  January 12, 2018 FINDINGS: Generalized bowel dilatation remains. A small amount of air is noted in the rectum. No air-fluid levels or free air evident. IMPRESSION: Persistent generalized bowel dilatation similar to 1 day prior. Suspect bowel obstruction as most likely etiology. Severe ileus could present similarly. Electronically Signed   By: Bretta Bang III M.D.   On: 01/13/2018 13:58   Dg Abd Portable 1v  Result Date: 01/12/2018 CLINICAL DATA:  Small bowel obstruction EXAM: PORTABLE ABDOMEN - 1 VIEW COMPARISON:  01/11/2018 abdominal radiograph FINDINGS: Enteric tube terminates in proximal stomach. Marked dilatation of small bowel loops throughout the abdomen, not appreciably changed. Moderate colonic stool volume. No evidence of pneumatosis or pneumoperitoneum. No radiopaque nephrolithiasis. Moderate lumbar spondylosis. IMPRESSION: No appreciable change in marked dilatation of small bowel loops throughout the abdomen compatible with distal small bowel obstruction. Electronically Signed   By: Delbert Phenix M.D.   On: 01/12/2018 15:34    Anti-infectives: Anti-infectives (From admission, onward)   Start     Dose/Rate Route Frequency Ordered Stop   01/09/18 0800  piperacillin-tazobactam (ZOSYN) IVPB 3.375 g     3.375 g 12.5 mL/hr over 240 Minutes Intravenous Every  8 hours 01/09/18 0146     01/09/18 0145  piperacillin-tazobactam (ZOSYN) IVPB 3.375 g     3.375 g 100 mL/hr over 30 Minutes Intravenous  Once 01/09/18 0135 01/09/18 0303       Assessment/Plan Principal Problem: Pulmonary embolus (HCC) Active Problems: Hypoxia AKI (acute kidney injury) (HCC) Hypotension Pulmonary embolism (HCC)  SBO - no hx of abdominal surgeries.  -NGT with bilious output still around 950cc, but with some bowel function yesterday.   -repeat films still shows significantly dilated small bowel, no real change.  ? Ileus (given never had  belly surgery and CT scan revealed enteritis on admission) vs high-grade pSBO. -will D/W MD regarding clamping trial or continuing LIWS -if remains NPO still, may need to consider some nutritional support coming up.  FEN: NPO/NGT/IVF VTE: Xarelto on hold/heparin drip ID: Zosyn 06/04>> Foley: none Follow up:TBD   LOS: 5 days    Letha CapeKelly E Veronnica Hennings , [facility] 01/14/2018, 8:14 AM Pager: [phone]

## 2018-01-14 NOTE — Progress Notes (Signed)
PROGRESS NOTE    Jason MARK  Myers:[id number] DOB: Apr 16, 1951 DOA: 01/08/2018 PCP: Gean Birchwood, [facility]    Brief Narrative:  Jason Myers a 67 y.o.malewith medical history significant ofhemorrhagic strokes/pruptured aneurysm in 1984, history of DVT/PE, HLD, and BPH;who presents with complaints of generalized abdominal pain was found to have enteritis , was started on zosyn. He was also found to have acute PE/ DVT AND was started on IV heparin.  He was later on transitioned to oral xarelto for continued anticoagulation. Overnight, pt became nauseated, started vomiting black emesis, which was postive for blood. X  Ray of the abd showed high grade SBO. Surgery and GI consulted.    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  Echocardiogram done showed moderate reduction in the right ventricular systolic function. He is on RA with good sats. He denies any sob or chest pain.  Currently he is on IV heparin and his hemoglobin stable around 13.     Abdominal pain with nausea and vomiting.  abd x ray showing  high grade SBO.  NG tube placed and surgery consulted. Pt underwent SBO protocol and his abd x ray showed persistent small bowel loops. Repeat abd film this morning showed persistent dilated bowel loops. Pt reports passing flatulence and loose watery bowel movements since yesterday.  Meanwhile GI consulted for evaluation of EGD for emesis positive for blood to rule out GI bleed. GI recommended no work up at this time and signed off.  xarelto changed to heparin, in case he needs surgery for his bowel obstruction.   As his  lactic acid is wnl, and his abd symptoms are better, no signs of peritonitis, ischemic bowel less likely.   Recommended the patient to ambulate to see if ileus/ SBO will get better.     Hypokalemia: replaced.  Recheck k tonight.    AKI:  Improved with hydration.  Creatinine at baseline.   Hyperlipidemia:  Resume crestor.    BPH:  On flomax.    Hypotension: on admission resolved.   H/o CVA  Pt was on pradaxa in the past, but not taking it .   DVT prophylaxis: heparin. Code Status:  Full code.  Family Communication: none at bedside.  Disposition Plan: pending resolution of the SBO   Consultants:   Gastroenterology  Surgery.     Procedures: echocardiogram     Antimicrobials:zosyn. Since admission.    Subjective: No new complaints. Appears frustrated.    Objective: Vitals:   01/13/18 0801 01/13/18 1704 01/13/18 2305 01/14/18 0820  BP: (!) 134/99 120/88 131/85 140/88  Pulse: 83 85 80 84  Resp:  18 14 (!) 22  Temp: 98 F (36.7 C)  97.6 F (36.4 C) 98.6 F (37 C)  TempSrc: Oral  Oral   SpO2: 98% (!) 89% 97% 90%  Weight:      Height:        Intake/Output Summary (Last 24 hours) at 01/14/2018 1216 Last data filed at 01/14/2018 0756 Gross per 24 hour  Intake 1835.68 ml  Output 1200 ml  Net 635.68 ml   [name]  Weights   01/08/18 2345  Weight: 83.5 kg (184 lb)    Examination:  General exam: not in distress, alert. NG tube connected to suction.  Respiratory system: good air entry bilateral no wheezing or rhonchi.  Cardiovascular system: S1 & S2 heard, RRR. No JVD, murmurs,  No pedal edema. Gastrointestinal system: Abdomen is soft , distended, no tenderness, bowel sounds good.  Central nervous system: Alert and oriented. Non focal.  Extremities: Symmetric 5 x 5 power. Skin: No rashes, lesions or ulcers Psychiatry:  Mood & affect appropriate.     Data Reviewed: I have personally reviewed following labs and imaging studies  CBC: Recent Labs  Lab  01/10/18 0344 01/11/18 0601  01/12/18 0546 01/12/18 1844 01/13/18 0515 01/13/18 1720 01/14/18 0210  WBC 10.9* 13.4*  --  9.2  --  10.0  --  9.1  HGB 15.4 15.9   < > 14.5 13.9 13.7 13.4 13.1  HCT 47.1 48.5   < > 43.0 42.6 41.7 40.8 39.8  MCV 90.6 89.0  --  88.3  --  89.1  --  88.4  PLT 189 226  --  214  --  204  --  216   < > = values in this interval not displayed.   Basic Metabolic Panel: Recent Labs  Lab 01/08/18 2205 01/09/18 0221 01/11/18 0944 01/12/18 0546 01/14/18 0210 01/14/18 1023  NA 141 141 138 132* 141  --   K 4.5 4.3 3.5 4.5 3.1*  --   CL 107 112* 101 94* 105  --   CO2 27 20* 27 25 27   --   GLUCOSE 145* 138* 137* 167* 110*  --   BUN 29* 28* 23* 11 10  --   CREATININE 1.42* 1.13 0.90 1.05 0.78  --   CALCIUM 9.5 8.8* 8.8* 9.8 7.8*  --   MG  --   --   --   --   --  2.0   GFR: Estimated Creatinine Clearance: 99.9 mL/min (by C-G formula based on SCr of 0.78 mg/dL). Liver Function Tests: Recent Labs  Lab 01/08/18 2205 01/11/18 0944  AST 18 19  ALT 12* 16*  ALKPHOS 52 54  BILITOT 0.7 1.0  PROT 6.7 6.3*  ALBUMIN 3.9 2.9*   Recent Labs  Lab 01/08/18 2205  LIPASE 38   No results for input(s): AMMONIA in the last 168 hours. Coagulation Profile: Recent Labs  Lab 01/09/18 0221  INR 1.06   Cardiac Enzymes: No results for input(s): CKTOTAL, CKMB, CKMBINDEX, TROPONINI in the last 168 hours. BNP (last 3 results) No results for input(s): PROBNP in the last 8760 hours. HbA1C: No results for input(s): HGBA1C in the last 72 hours. CBG: Recent Labs  Lab 01/12/18 1048 01/12/18 1153  GLUCAP 83 84   Lipid Profile: No results for input(s): CHOL, HDL, LDLCALC, TRIG, CHOLHDL, LDLDIRECT in the last 72 hours. Thyroid Function Tests: No results for input(s): TSH, T4TOTAL, FREET4, T3FREE, THYROIDAB in the last 72 hours. Anemia Panel: No results for input(s): VITAMINB12, FOLATE, FERRITIN, TIBC, IRON, RETICCTPCT in the last 72 hours. Sepsis Labs: Recent Labs    Lab 01/08/18 2252 01/09/18 0037 01/11/18 0944  LATICACIDVEN 1.92* 1.67 1.4    Recent Results (from the past 240 hour(s))  Blood Culture (routine x 2)     Status: None (Preliminary result)   Collection Time: 01/09/18  2:15 AM  Result Value Ref Range Status   Specimen Description BLOOD RIGHT HAND  Final   Special Requests  Final    BOTTLES DRAWN AEROBIC AND ANAEROBIC Blood Culture results may not be optimal due to an inadequate volume of blood received in culture bottles   Culture   Final    NO GROWTH 4 DAYS Performed at [facility], [address] [place], [place] [address]    Report Status PENDING  Incomplete  Blood Culture (routine x 2)     Status: None (Preliminary result)   Collection Time: 01/09/18  2:20 AM  Result Value Ref Range Status   Specimen Description BLOOD RIGHT FOREARM  Final   Special Requests   Final    BOTTLES DRAWN AEROBIC AND ANAEROBIC Blood Culture adequate volume   Culture   Final    NO GROWTH 4 DAYS Performed at [facility], [address] [place], [place] [address]    Report Status PENDING  Incomplete         Radiology Studies: Dg Abd Portable 1v  Result Date: 01/14/2018 CLINICAL DATA:  Abdominal pain. Follow-up dilated bowel loops. Nasogastric tube placement. EXAM: PORTABLE ABDOMEN - 1 VIEW COMPARISON:  01/13/2018 FINDINGS: A nasogastric tube is seen with tip overlying the proximal stomach. Markedly dilated small bowel loops show no significant change. There is some gas within nondilated colon. These findings are suspicious for a distal small bowel obstruction. IMPRESSION: Findings suspicious for distal small bowel obstruction, without significant change. Nasogastric tube tip overlies the proximal stomach. Electronically Signed   By: Myles Rosenthal M.D.   On: 01/14/2018 11:35   Dg Abd Portable 1v  Result Date: 01/13/2018 CLINICAL DATA:  Small bowel obstruction EXAM: PORTABLE ABDOMEN - 1 VIEW COMPARISON:  January 12, 2018 FINDINGS:  Generalized bowel dilatation remains. A small amount of air is noted in the rectum. No air-fluid levels or free air evident. IMPRESSION: Persistent generalized bowel dilatation similar to 1 day prior. Suspect bowel obstruction as most likely etiology. Severe ileus could present similarly. Electronically Signed   By: Bretta Bang III M.D.   On: 01/13/2018 13:58   Dg Abd Portable 1v  Result Date: 01/12/2018 CLINICAL DATA:  Small bowel obstruction EXAM: PORTABLE ABDOMEN - 1 VIEW COMPARISON:  01/11/2018 abdominal radiograph FINDINGS: Enteric tube terminates in proximal stomach. Marked dilatation of small bowel loops throughout the abdomen, not appreciably changed. Moderate colonic stool volume. No evidence of pneumatosis or pneumoperitoneum. No radiopaque nephrolithiasis. Moderate lumbar spondylosis. IMPRESSION: No appreciable change in marked dilatation of small bowel loops throughout the abdomen compatible with distal small bowel obstruction. Electronically Signed   By: Delbert Phenix M.D.   On: 01/12/2018 15:34        Scheduled Meds: . rosuvastatin  10 mg Oral Daily  . tamsulosin  0.4 mg Oral QHS   Continuous Infusions: . dextrose 5 % and 0.9% NaCl 50 mL/hr at 01/14/18 0521  . famotidine (PEPCID) IV 20 mg (01/14/18 1119)  . heparin 1,650 Units/hr (01/13/18 2115)  . piperacillin-tazobactam (ZOSYN)  IV 3.375 g (01/14/18 1120)  . potassium chloride 10 mEq (01/14/18 1121)     LOS: 5 days    Time spent: 35 minutes.     Kathlen Mody, MD [facility] Pager [phone]   If 7PM-7AM, please contact night-coverage [email or web address] Password [email or web address] 01/14/2018, 12:16 PM

## 2018-01-14 NOTE — Plan of Care (Signed)
Discussed with patient plan of care for the evening, pain management and why the NG tube is still important to have in at this time with some teach back displayed

## 2018-01-14 NOTE — Progress Notes (Signed)
ANTICOAGULATION CONSULT NOTE - Follow-Up  Pharmacy Consult for Xarelto back to Heparin Indication: pulmonary embolus  No Known Allergies  Patient Measurements: Height: 6\' 1"  (185.4 cm) Weight: 184 lb (83.5 kg) IBW/kg (Calculated) : 79.9  Vital Signs: Temp: 98.6 F (37 C) (06/09 0820) Temp Source: Oral (06/08 2305) BP: 140/88 (06/09 0820) Pulse Rate: 84 (06/09 0820)  Labs: Recent Labs    01/12/18 0546  01/13/18 0515 01/13/18 1720 01/14/18 0210  HGB 14.5   < > 13.7 13.4 13.1  HCT 43.0   < > 41.7 40.8 39.8  PLT 214  --  204  --  216  APTT  --    < > 102* 67* 78*  HEPARINUNFRC  --    < > 0.71* 0.33 0.38  CREATININE 1.05  --   --   --  0.78   < > = values in this interval not displayed.    Estimated Creatinine Clearance: 99.9 mL/min (by C-G formula based on SCr of 0.78 mg/dL).   Medical History: Past Medical History:  Diagnosis Date  . BPH (benign prostatic hyperplasia)   . CVA (cerebral vascular accident) (HCC) 1984   left side affected.    . Hypercholesterolemia     Assessment: 67yo male c/o abdominal pain, CT reveals PE (possibly subacute) to begin heparin; of note, pt had PE in April 2018 tx'd w/ Pradaxa, which has since been d/c'd.  Transitioned to Xarelto 6/5, last dose 6/5 at 1700, now with new SBO, to transition back to heparin.  Heparin level was 0.38, aPTT was 78- within goal range, levels correlating so no longer need to monitor aPTT. Hgb 13.1, plt 216. No s/sx of bleeding. No infusion issues.  Goal of Therapy:  Heparin level 0.3-0.7 units/ml Monitor platelets by anticoagulation protocol: Yes    Plan:  Continue heparin infusion at 1650 units/hr  Monitor daily HL/aPTT, CBC, for s/sx of bleeding Follow up when able to transition to Xarelto  Thank you,  Girard CooterKimberly Perkins, PharmD Clinical Pharmacist  Pager: [phone] Phone: [phone]

## 2018-01-15 ENCOUNTER — Inpatient Hospital Stay (HOSPITAL_COMMUNITY): Payer: Medicare HMO

## 2018-01-15 ENCOUNTER — Inpatient Hospital Stay: Payer: Self-pay

## 2018-01-15 LAB — BASIC METABOLIC PANEL
ANION GAP: 9 (ref 5–15)
BUN: 9 mg/dL (ref 6–20)
CALCIUM: 8 mg/dL — AB (ref 8.9–10.3)
CO2: 26 mmol/L (ref 22–32)
Chloride: 105 mmol/L (ref 101–111)
Creatinine, Ser: 0.79 mg/dL (ref 0.61–1.24)
GLUCOSE: 115 mg/dL — AB (ref 65–99)
POTASSIUM: 3 mmol/L — AB (ref 3.5–5.1)
Sodium: 140 mmol/L (ref 135–145)

## 2018-01-15 LAB — HEPARIN LEVEL (UNFRACTIONATED): HEPARIN UNFRACTIONATED: 0.43 [IU]/mL (ref 0.30–0.70)

## 2018-01-15 LAB — CBC
HCT: 42.4 % (ref 39.0–52.0)
HEMOGLOBIN: 13.8 g/dL (ref 13.0–17.0)
MCH: 29.1 pg (ref 26.0–34.0)
MCHC: 32.5 g/dL (ref 30.0–36.0)
MCV: 89.3 fL (ref 78.0–100.0)
PLATELETS: 231 10*3/uL (ref 150–400)
RBC: 4.75 MIL/uL (ref 4.22–5.81)
RDW: 13.5 % (ref 11.5–15.5)
WBC: 9.8 10*3/uL (ref 4.0–10.5)

## 2018-01-15 LAB — GLUCOSE, CAPILLARY: GLUCOSE-CAPILLARY: 111 mg/dL — AB (ref 65–99)

## 2018-01-15 LAB — HEMOGLOBIN AND HEMATOCRIT, BLOOD
HEMATOCRIT: 45.1 % (ref 39.0–52.0)
Hemoglobin: 14.9 g/dL (ref 13.0–17.0)

## 2018-01-15 MED ORDER — INSULIN ASPART 100 UNIT/ML ~~LOC~~ SOLN: 0.0000 [IU] | Freq: Four times a day (QID) | SUBCUTANEOUS | Status: DC

## 2018-01-15 MED ORDER — DEXTROSE-NACL 5-0.9 % IV SOLN
INTRAVENOUS | Status: DC
  Administered 2018-01-15: 22:00:00 via INTRAVENOUS

## 2018-01-15 MED ORDER — POTASSIUM CHLORIDE 10 MEQ/100ML IV SOLN
10.0000 meq | INTRAVENOUS | Status: AC
  Administered 2018-01-15 (×3): 10 meq via INTRAVENOUS
  Filled 2018-01-15 (×2): qty 100

## 2018-01-15 MED ORDER — POTASSIUM CHLORIDE 10 MEQ/100ML IV SOLN
10.0000 meq | INTRAVENOUS | Status: AC
  Administered 2018-01-15 (×2): 10 meq via INTRAVENOUS
  Filled 2018-01-15 (×5): qty 100

## 2018-01-15 MED ORDER — POTASSIUM CHLORIDE 10 MEQ/100ML IV SOLN
10.0000 meq | INTRAVENOUS | Status: AC
  Administered 2018-01-15 (×3): 10 meq via INTRAVENOUS
  Filled 2018-01-15: qty 100

## 2018-01-15 MED ORDER — TRAVASOL 10 % IV SOLN
INTRAVENOUS | Status: DC
  Filled 2018-01-15: qty 432

## 2018-01-15 NOTE — Progress Notes (Signed)
NGT clamped per MD order.

## 2018-01-15 NOTE — Progress Notes (Signed)
[facility] Progress Note      Assessment/Plan Principal Problem: Pulmonary embolus (HCC) Active Problems: Hypoxia AKI (acute kidney injury) (HCC) Hypotension Pulmonary embolism (HCC)  SBO - no hx of abdominal surgeries. - NGT with bilious output, bowel function yesterday, eating ice chips - repeat films still shows significantly dilated small bowel, no real change.  - clamping trail today - PICC with TPN in setting of prolonged ileus  FEN: NPO/NGT/IVF, TPN VTE: Xareltoon hold/heparin drip ID: Zosyn 06/04-06/10 Foley: none Follow up:TBD  Plan: TPN, clamping trial, stopped Zosyn, ambulate   LOS: 6 days    Subjective:  CC: ileus  Pt had BM's yesterday and is passing gas. Eating ice chips with his hard candy. Abdominal pain <1/10. Pt states at baseline a very sedentary life style. No issues overnight.   Objective: Vital signs in last 24 hours: Temp:  [98.9 F (37.2 C)-100.1 F (37.8 C)] 98.9 F (37.2 C) (06/09 2325) Pulse Rate:  [84-92] 84 (06/10 0823) Resp:  [16-22] 16 (06/09 2223) BP: (125-128)/(89-103) 126/103 (06/10 0823) SpO2:  [92 %-97 %] 96 % (06/10 0823) Last BM Date: 01/14/18  Intake/Output from previous day: 06/09 0701 - 06/10 0700 In: 2135.6 [P.O.:120; I.V.:1765.6; IV Piggyback:250] Out: 2275 [Urine:1275; Emesis/NG output:1000] Intake/Output this shift: No intake/output data recorded.  PE: Gen:  Alert, NAD, pleasant, cooperative, non toxic appearing HEENT: NGT in place with bilious output Pulm:  Rate and effort normal Abd: Soft, no distention, + BS, very mild generalized TTP without guarding and no signs of peritonitis  Skin: no rashes noted, warm and dry  Anti-infectives: Anti-infectives (From admission, onward)   Start     Dose/Rate Route Frequency Ordered Stop   01/09/18 0800  piperacillin-tazobactam (ZOSYN) IVPB 3.375 g     3.375 g 12.5 mL/hr over 240 Minutes Intravenous Every 8 hours 01/09/18 0146      01/09/18 0145  piperacillin-tazobactam (ZOSYN) IVPB 3.375 g     3.375 g 100 mL/hr over 30 Minutes Intravenous  Once 01/09/18 0135 01/09/18 0303      Lab Results:  Recent Labs    01/14/18 0210 01/14/18 1843 01/15/18 0540  WBC 9.1  --  9.8  HGB 13.1 14.6 13.8  HCT 39.8 44.7 42.4  PLT 216  --  231   BMET Recent Labs    01/14/18 0210 01/15/18 0540  NA 141 140  K 3.1* 3.0*  CL 105 105  CO2 27 26  GLUCOSE 110* 115*  BUN 10 9  CREATININE 0.78 0.79  CALCIUM 7.8* 8.0*   PT/INR No results for input(s): LABPROT, INR in the last 72 hours. CMP     Component Value Date/Time   NA 140 01/15/2018 0540   K 3.0 (L) 01/15/2018 0540   CL 105 01/15/2018 0540   CO2 26 01/15/2018 0540   GLUCOSE 115 (H) 01/15/2018 0540   BUN 9 01/15/2018 0540   CREATININE 0.79 01/15/2018 0540   CALCIUM 8.0 (L) 01/15/2018 0540   PROT 6.3 (L) 01/11/2018 0944   ALBUMIN 2.9 (L) 01/11/2018 0944   AST 19 01/11/2018 0944   ALT 16 (L) 01/11/2018 0944   ALKPHOS 54 01/11/2018 0944   BILITOT 1.0 01/11/2018 0944   GFRNONAA >60 01/15/2018 0540   GFRAA >60 01/15/2018 0540   Lipase     Component Value Date/Time   LIPASE 38 01/08/2018 2205    Studies/Results: Dg Abd Portable 1v  Result Date: 01/15/2018 CLINICAL DATA:  Small bowel obstruction. EXAM: PORTABLE ABDOMEN - 1 VIEW COMPARISON:  01/14/2018 FINDINGS: There is a nasogastric tube which is coiled in the left upper quadrant of the abdomen with tip projecting over the body of stomach. Gaseous distension of the large and small bowel loops are again noted. Not significantly improved from previous exam. No air-fluid levels identified. IMPRESSION: 1. No change in the appearance of small bowel obstruction pattern. Electronically Signed   By: Signa Kell M.D.   On: 01/15/2018 09:03   Dg Abd Portable 1v  Result Date: 01/14/2018 CLINICAL DATA:  Abdominal pain. Follow-up dilated bowel loops. Nasogastric tube placement. EXAM: PORTABLE ABDOMEN - 1 VIEW  COMPARISON:  01/13/2018 FINDINGS: A nasogastric tube is seen with tip overlying the proximal stomach. Markedly dilated small bowel loops show no significant change. There is some gas within nondilated colon. These findings are suspicious for a distal small bowel obstruction. IMPRESSION: Findings suspicious for distal small bowel obstruction, without significant change. Nasogastric tube tip overlies the proximal stomach. Electronically Signed   By: Myles Rosenthal M.D.   On: 01/14/2018 11:35   Dg Abd Portable 1v  Result Date: 01/13/2018 CLINICAL DATA:  Small bowel obstruction EXAM: PORTABLE ABDOMEN - 1 VIEW COMPARISON:  January 12, 2018 FINDINGS: Generalized bowel dilatation remains. A small amount of air is noted in the rectum. No air-fluid levels or free air evident. IMPRESSION: Persistent generalized bowel dilatation similar to 1 day prior. Suspect bowel obstruction as most likely etiology. Severe ileus could present similarly. Electronically Signed   By: Bretta Bang III M.D.   On: 01/13/2018 13:58   [place] Ekg Site Rite  Result Date: 01/15/2018 If Site Rite image not attached, placement could not be confirmed due to current cardiac rhythm.     Jerre Simon , [facility] 01/15/2018, 9:32 AM  Pager: [phone] Mon-Wed, Friday 7:00am-4:30pm Thurs 7am-11:30am  Consults: [phone]

## 2018-01-15 NOTE — Progress Notes (Addendum)
Spoke with Dr. Blake DivineAkula about PICC placement in regards to Multi IV meds,with central nutrition and  heparin , then the  needs for labs with limited venous access and left arm being contracted the PICC would restrict the right arm as well.  We agreed that a tunneled PICC would be most appropriate . Dr.Akula to place orders for IR.

## 2018-01-15 NOTE — Plan of Care (Signed)
Discussed plan of care for the evening and medications with some teach displayed

## 2018-01-15 NOTE — Progress Notes (Signed)
NGT unclamped and to LIWS.  Will monitor outputs.

## 2018-01-15 NOTE — Progress Notes (Signed)
Rehab Admissions Coordinator Note:  Patient was screened by Clois DupesBoyette, Asma Boldon Godwin for appropriateness for an Inpatient Acute Rehab Consult per PT recommendation.  At this time, we are recommending await further progress with therapy before determining rehab venue options. I iwll follow.Clois Dupes.  Chandan Fly Godwin 01/15/2018, 11:35 AM  I can be reached at [phone].

## 2018-01-15 NOTE — Progress Notes (Signed)
PHARMACY - ADULT TOTAL PARENTERAL NUTRITION CONSULT NOTE   Pharmacy Consult for TPN Indication: Prolonged Ileus  Patient Measurements: Height: 6\' 1"  (185.4 cm) Weight: 184 lb (83.5 kg) IBW/kg (Calculated) : 79.9 TPN AdjBW (KG): 83.5 Body mass index is 24.28 kg/m.  Assessment:  67 yo M presents on 6/4 with abdominal pain. Started acutely on the evening of 6/3. Had a short 1 month course of Pradaxa back in 2018 and was not continued due to insurance issues. In ED found to have PE with signs of ileitis. Imaging also revealed an SBO. Was put on NPO diet and NG tube placed. Now day #7 of hospital stay, pharmacy consulted to start TPN.   GI: TPN for SBO. Albumin low at 2.9. No improvement on last abdominal film. Still having bilious NG output (1 L yesterday) Endo: No hx of DM. AM glucose looks ok at low 100s. Insulin requirements in the past 24 hours: 0 units Lytes: wnl today exc K low at 3.0 (replaced per MD with 3 runs) Mg ok and no Phos on admit. Renal: UOP not all charted. D5NS at 1250ml/hr. tamsulosin Pulm: 2.5 L of [place] Cards: On heparin gtt for PE. Crestor Hepatobil: LFTs ok on 6/6. Tbili 1.0.  Neuro: GCS 15, pain score 0. ID: No current abx. Tmax of 100.1 yesterday, WBC wnl.  TPN Access: PICC ordered TPN start date: 6/10 Nutritional Goals (per RD recommendation on n/a): KCal: 2,200-2,500 Protein: 110-130 g Fluid: > 2 L  Goal TPN rate is 90 ml/hr (provides 100% of patient needs)  Current Nutrition:   Plan:  Start TPN at 5430mL/hr tonight This TPN provides 43 g of protein, 108 g of dextrose, and 16 g of lipids which provides 698 kCals per day, meeting ~33% of patient needs Electrolytes in TPN: standard concentration; Cl:Ac 1:1 Stop Pepcid IV and add to bag Add MVI, trace elements, famotidine 40mg  to TPN Start sensitive SSI Q6h tonight and adjust as needed Decrease D5NS to 9810ml/hr tonight Monitor TPN labs F/U surgery recs, improvement in SBO  Received one run of KCl this am,  will update order so patient receives total of 6 runs today  Enzo BiNathan Rachael Ferrie, PharmD, BCPS Clinical Pharmacist Phone number [phone] 01/15/2018 10:04 AM

## 2018-01-15 NOTE — Progress Notes (Signed)
PROGRESS NOTE    Jason Myers  YNW:[id number] DOB: 09/06/1950 DOA: 01/08/2018 PCP: Gean BirchwoodPa, [facility]    Brief Narrative:  Jason Myers a 67 y.o.malewith medical history significant ofhemorrhagic strokes/pruptured aneurysm in 1984, history of DVT/PE, HLD, and BPH;who presents with complaints of generalized abdominal pain was found to have enteritis , was started on zosyn. He was also found to have acute PE/ DVT AND was started on IV heparin.  He was later on transitioned to oral xarelto for continued anticoagulation. Overnight, pt became nauseated, started vomiting black emesis, which was postive for blood. X  Ray of the abd showed high grade SBO. Surgery and GI consulted.    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  Echocardiogram done showed moderate reduction in the right ventricular systolic function. He denies any sob or chest pain.  Currently he is on IV heparin and his hemoglobin stable around 13. No new complaints.      Abdominal pain with nausea and vomiting.  abd x ray showing  high grade SBO.  NG tube placed and surgery consulted. Pt underwent SBO protocol and his abd x ray showed persistent small bowel loops. Repeat abd film this morning showed persistent dilated bowel loops. Pt reports passing flatulence and loose watery bowel movements since yesterday.  Meanwhile GI consulted for evaluation of EGD for emesis positive for blood to rule out GI bleed. GI recommended no work up at this time and signed off.  xarelto changed to heparin, in case he needs surgery for his bowel  obstruction.   As his lactic acid is wnl, and his abd symptoms are better, no signs of peritonitis, ischemic bowel less likely.   Recommended the patient to ambulate to see if ileus/ SBO will get better.  Surgery recommended TPN and a PICC line.     Hypokalemia: replaced.  Repeat in am.   AKI:  Improved with hydration.  Creatinine at baseline.   Hyperlipidemia:  Resume crestor.    BPH:  On flomax.    Hypotension: on admission resolved.   H/o CVA  Pt was on pradaxa in the past, but not taking it .   DVT prophylaxis: heparin. Code Status:  Full code.  Family Communication: none at bedside.  Disposition Plan: pending resolution of the SBO   Consultants:   Gastroenterology  Surgery.     Procedures: echocardiogram     Antimicrobials:zosyn. Since admission.    Subjective: No new complaints. Discussed regarding ambulating, no chest pain or sob.    Objective: Vitals:   01/14/18 0820 01/14/18 1804 01/14/18 2223 01/14/18 2325  BP: 140/88 125/89 (!) 128/96   Pulse: 84 92 88   Resp: (!) 22 (!) 22 16   Temp: 98.6 F (37 C) 99 F (37.2 C) 100.1 F (37.8 C) 98.9 F (37.2 C)  TempSrc:  Oral Oral Oral  SpO2: 90% 92% 97%   Weight:      Height:        Intake/Output Summary (Last 24 hours) at 01/15/2018 0746 Last data filed at 01/15/2018 [id number] Gross per  24 hour  Intake 2135.58 ml  Output 2275 ml  Net -139.42 ml   Filed Weights   01/08/18 2345  Weight: 83.5 kg (184 lb)    Examination:  General exam: not in distress, alert. NG tube connected to suction.  Respiratory system: bilateral air entry fair, no wheezing .   Cardiovascular system: S1 & S2 heard, RRR. No JVD, murmurs,  No pedal edema. Gastrointestinal system: Abdomen is soft , distended, no tenderness, bowel sounds good.  Central nervous system: Alert and oriented. Non focal.  Extremities: Symmetric 5 x 5 power.no cyanosis or clubbing. Left upper ext hemiparesis.  Skin: No rashes, lesions or  ulcers Psychiatry:  Mood & affect appropriate.     Data Reviewed: I have personally reviewed following labs and imaging studies  CBC: Recent Labs  Lab 01/11/18 0601  01/12/18 0546  01/13/18 0515 01/13/18 1720 01/14/18 0210 01/14/18 1843 01/15/18 0540  WBC 13.4*  --  9.2  --  10.0  --  9.1  --  9.8  HGB 15.9   < > 14.5   < > 13.7 13.4 13.1 14.6 13.8  HCT 48.5   < > 43.0   < > 41.7 40.8 39.8 44.7 42.4  MCV 89.0  --  88.3  --  89.1  --  88.4  --  89.3  PLT 226  --  214  --  204  --  216  --  231   < > = values in this interval not displayed.   Basic Metabolic Panel: Recent Labs  Lab 01/09/18 0221 01/11/18 0944 01/12/18 0546 01/14/18 0210 01/14/18 1023 01/15/18 0540  NA 141 138 132* 141  --  140  K 4.3 3.5 4.5 3.1*  --  3.0*  CL 112* 101 94* 105  --  105  CO2 20* 27 25 27   --  26  GLUCOSE 138* 137* 167* 110*  --  115*  BUN 28* 23* 11 10  --  9  CREATININE 1.13 0.90 1.05 0.78  --  0.79  CALCIUM 8.8* 8.8* 9.8 7.8*  --  8.0*  MG  --   --   --   --  2.0  --    GFR: Estimated Creatinine Clearance: 99.9 mL/min (by C-G formula based on SCr of 0.79 mg/dL). Liver Function Tests: Recent Labs  Lab 01/08/18 2205 01/11/18 0944  AST 18 19  ALT 12* 16*  ALKPHOS 52 54  BILITOT 0.7 1.0  PROT 6.7 6.3*  ALBUMIN 3.9 2.9*   Recent Labs  Lab 01/08/18 2205  LIPASE 38   No results for input(s): AMMONIA in the last 168 hours. Coagulation Profile: Recent Labs  Lab 01/09/18 0221  INR 1.06   Cardiac Enzymes: No results for input(s): CKTOTAL, CKMB, CKMBINDEX, TROPONINI in the last 168 hours. BNP (last 3 results) No results for input(s): PROBNP in the last 8760 hours. HbA1C: No results for input(s): HGBA1C in the last 72 hours. CBG: Recent Labs  Lab 01/12/18 1048 01/12/18 1153  GLUCAP 83 84   Lipid Profile: No results for input(s): CHOL, HDL, LDLCALC, TRIG, CHOLHDL, LDLDIRECT in the last 72 hours. Thyroid Function Tests: No results for input(s): TSH, T4TOTAL,  FREET4, T3FREE, THYROIDAB in the last 72 hours. Anemia Panel: No results for input(s): VITAMINB12, FOLATE, FERRITIN, TIBC, IRON, RETICCTPCT in the last 72 hours. Sepsis Labs: Recent Labs  Lab 01/08/18 2252 01/09/18 0037 01/11/18 0944  LATICACIDVEN 1.92* 1.67 1.4    Recent Results (from the past 240 hour(s))  Blood Culture (routine x 2)     Status: None   Collection Time: 01/09/18  2:15 AM  Result Value Ref Range Status   Specimen Description BLOOD RIGHT HAND  Final   Special Requests   Final    BOTTLES DRAWN AEROBIC AND ANAEROBIC Blood Culture results may not be optimal due to an inadequate volume of blood received in culture bottles   Culture   Final    NO GROWTH 5 DAYS Performed at [facility], [address] [place], [place] [address]    Report Status 01/14/2018 FINAL  Final  Blood Culture (routine x 2)     Status: None   Collection Time: 01/09/18  2:20 AM  Result Value Ref Range Status   Specimen Description BLOOD RIGHT FOREARM  Final   Special Requests   Final    BOTTLES DRAWN AEROBIC AND ANAEROBIC Blood Culture adequate volume   Culture   Final    NO GROWTH 5 DAYS Performed at [facility], [address] [place], [place] [address]    Report Status 01/14/2018 FINAL  Final         Radiology Studies: Dg Abd Portable 1v  Result Date: 01/14/2018 CLINICAL DATA:  Abdominal pain. Follow-up dilated bowel loops. Nasogastric tube placement. EXAM: PORTABLE ABDOMEN - 1 VIEW COMPARISON:  01/13/2018 FINDINGS: A nasogastric tube is seen with tip overlying the proximal stomach. Markedly dilated small bowel loops show no significant change. There is some gas within nondilated colon. These findings are suspicious for a distal small bowel obstruction. IMPRESSION: Findings suspicious for distal small bowel obstruction, without significant change. Nasogastric tube tip overlies the proximal stomach. Electronically Signed   By: Myles Rosenthal M.D.   On: 01/14/2018 11:35   Dg  Abd Portable 1v  Result Date: 01/13/2018 CLINICAL DATA:  Small bowel obstruction EXAM: PORTABLE ABDOMEN - 1 VIEW COMPARISON:  January 12, 2018 FINDINGS: Generalized bowel dilatation remains. A small amount of air is noted in the rectum. No air-fluid levels or free air evident. IMPRESSION: Persistent generalized bowel dilatation similar to 1 day prior. Suspect bowel obstruction as most likely etiology. Severe ileus could present similarly. Electronically Signed   By: Bretta Bang III M.D.   On: 01/13/2018 13:58        Scheduled Meds: . rosuvastatin  10 mg Oral Daily  . tamsulosin  0.4 mg Oral QHS   Continuous Infusions: . dextrose 5 % and 0.9% NaCl 50 mL/hr at 01/14/18 2226  . famotidine (PEPCID) IV Stopped (01/14/18 2255)  . heparin 1,650 Units/hr (01/15/18 0210)  . piperacillin-tazobactam (ZOSYN)  IV 3.375 g (01/15/18 0333)  . potassium chloride       LOS: 6 days    Time spent: 35 minutes.     Kathlen Mody, MD [facility] Pager [phone]   If 7PM-7AM, please contact night-coverage [email or web address] Password [facility] 01/15/2018, 7:46 AM

## 2018-01-15 NOTE — Evaluation (Signed)
Physical Therapy Evaluation Patient Details Name: Jason DanielFrank F Shuffler MRN: [id number] DOB: 06/18/1951 Today's Date: 01/15/2018   History of Present Illness  67 y.o. male with medical history significant of hemorrhagic stroke s/p ruptured aneurysm in 1984, history of DVT/PE, HLD, and BPH; who presents with complaints of generalized abdominal pain was found to have enteritis , was started on zosyn. He was also found to have acute PE/ DVT AND was started on IV heparin. abd x ray showing  high grade SBO. NG tube placed and surgery consulted.  Clinical Impression  PTA pt lives in public housing and was independent with limited community ambulation, drives (although car not working right now), independent in ADLs and has aide to assist with iADLs 3x/wk, receives senior nutrition in his building. Pt currently limited by decreased strength and endurance. Pt currently requires modA for bed mobility, transfers and shuffling steps to recliner. Given pt's prior level of independence pt would be a good candidate for CIR level rehab at d/c. PT will continue to follow acutely.     Follow Up Recommendations CIR    Equipment Recommendations  Other (comment)(TBD)    Recommendations for Other Services Rehab consult;OT consult     Precautions / Restrictions Precautions Precautions: Fall Restrictions Weight Bearing Restrictions: No      Mobility  Bed Mobility Overal bed mobility: Needs Assistance Bed Mobility: Supine to Sit;Sit to Supine     Supine to sit: Mod assist Sit to supine: Mod assist   General bed mobility comments: modA for trunk to upright, and LE management on and off bed  Transfers Overall transfer level: Needs assistance Equipment used: 1 person hand held assist Transfers: Sit to/from Stand Sit to Stand: Mod assist         General transfer comment: minA for power up modA for steadying in standing   Ambulation/Gait Ambulation/Gait assistance: Mod assist Ambulation Distance  (Feet): 3 Feet Assistive device: 1 person hand held assist Gait Pattern/deviations: Decreased dorsiflexion - left;Decreased stance time - left;Step-to pattern;Shuffle Gait velocity: slow Gait velocity interpretation: <1.31 ft/sec, indicative of household ambulator General Gait Details: modA for taking steps forward and pivoting to chair, decreased ability to lift and move L LE       Balance Overall balance assessment: Needs assistance Sitting-balance support: No upper extremity supported;Feet supported Sitting balance-Leahy Scale: Fair     Standing balance support: Bilateral upper extremity supported Standing balance-Leahy Scale: Zero Standing balance comment: requires modA for standing                             Pertinent Vitals/Pain Pain Assessment: 0-10 Pain Score: 1  Pain Location: abdomen Pain Descriptors / Indicators: Tender Pain Intervention(s): Limited activity within patient's tolerance;Monitored during session;Repositioned    Home Living Family/patient expects to be discharged to:: Private residence Living Arrangements: Alone Available Help at Discharge: Available PRN/intermittently;Friend(s) Type of Home: Apartment Home Access: Elevator     Home Layout: One level Home Equipment: Cane - single point;Grab bars - tub/shower      Prior Function Level of Independence: Needs assistance   Gait / Transfers Assistance Needed: limited community ambulation  ADL's / Homemaking Assistance Needed: independent with ADLS, has aide 3x/wk to assist with iADLs  Comments: senior nutrition program,         Extremity/Trunk Assessment   Upper Extremity Assessment Upper Extremity Assessment: Defer to OT evaluation    Lower Extremity Assessment Lower Extremity Assessment: LLE deficits/detail;RLE deficits/detail RLE  Deficits / Details: lacks full ROM, strength grossly 3/5 RLE Coordination: WNL LLE Deficits / Details: limited by prior hemorrhagic stroke, lacks  full ROM, strength grossly 2+/5  LLE Coordination: decreased fine motor;decreased gross motor       Communication   Communication: No difficulties  Cognition Arousal/Alertness: Awake/alert Behavior During Therapy: WFL for tasks assessed/performed Overall Cognitive Status: Within Functional Limits for tasks assessed                                        General Comments General comments (skin integrity, edema, etc.): Pt on 2.5 L O2 via nasal cannula, however on entry nasal cannula not in place and SaO2 84%O2, with cannula back in place SaO2 only returned to 88%O2, increased supplemental O2 to 3 L and pt able to maintain SaO2 at 91%O2. Pt able to come to sitting with assist, once in sitting pt requested to get back to bed to use bed pan and successfully had a bowel movement, pt then able to return to sitting EoB for contnuation of Evaluation.         Assessment/Plan    PT Assessment Patient needs continued PT services  PT Problem List Decreased strength;Decreased range of motion;Decreased activity tolerance;Decreased balance;Decreased mobility;Decreased coordination       PT Treatment Interventions DME instruction;Gait training;Functional mobility training;Therapeutic activities;Therapeutic exercise;Balance training;Cognitive remediation;Patient/family education    PT Goals (Current goals can be found in the Care Plan section)  Acute Rehab PT Goals Patient Stated Goal: feel better PT Goal Formulation: With patient Time For Goal Achievement: 01/29/18 Potential to Achieve Goals: Fair    Frequency Min 3X/week    AM-PAC PT "6 Clicks" Daily Activity  Outcome Measure Difficulty turning over in bed (including adjusting bedclothes, sheets and blankets)?: A Little Difficulty moving from lying on back to sitting on the side of the bed? : Unable Difficulty sitting down on and standing up from a chair with arms (e.g., wheelchair, bedside commode, etc,.)?: Unable Help  needed moving to and from a bed to chair (including a wheelchair)?: A Lot Help needed walking in hospital room?: A Lot Help needed climbing 3-5 steps with a railing? : Total 6 Click Score: 10    End of Session Equipment Utilized During Treatment: Gait belt;Oxygen Activity Tolerance: Patient limited by fatigue Patient left: in chair;with call bell/phone within reach Nurse Communication: Mobility status PT Visit Diagnosis: Unsteadiness on feet (R26.81);Other abnormalities of gait and mobility (R26.89);Muscle weakness (generalized) (M62.81);Difficulty in walking, not elsewhere classified (R26.2);Other symptoms and signs involving the nervous system (R29.898);History of falling (Z91.81);Hemiplegia and hemiparesis Hemiplegia - Right/Left: Left Hemiplegia - dominant/non-dominant: Non-dominant Hemiplegia - caused by: Unspecified    Time: 0930-1020 PT Time Calculation (min) (ACUTE ONLY): 50 min   Charges:   PT Evaluation $PT Eval Moderate Complexity: 1 Mod PT Treatments $Therapeutic Activity: 23-37 mins   PT G Codes:        Payden Bonus B. Beverely Risen PT, DPT Acute Rehabilitation  [phone] Pager [phone]    Elon Alas Fleet 01/15/2018, 11:30 AM

## 2018-01-15 NOTE — Progress Notes (Addendum)
Initial Nutrition Assessment  DOCUMENTATION CODES:   Not applicable  INTERVENTION:    TPN per pharmacy  NUTRITION DIAGNOSIS:   Inadequate oral intake related to altered GI function as evidenced by NPO status  GOAL:   Patient will meet greater than or equal to 90% of their needs   MONITOR:   Diet advancement, PO intake, Labs, Skin, Weight trends, I & O's  REASON FOR ASSESSMENT:   Consult New TPN/TNA  ASSESSMENT:   67 y.o. Male with PMH significant of hemorrhagic stroke s/p ruptured aneurysm in 1984, history of DVT/PE, HLD, and BPH; who presented with complaints of generalized abdominal pain. Xray showed high grade SBO.  RD spoke with patient at bedside. NGT in place. Previously to LIS. Now clamped. He has not had any PO intake since admission (7 days).  Last meal he ate was dinner on 6/3.  After eating he felt like he swallowed a "bowling ball". It did not "pass" and therefore he called 911.  Pt currently NPO.  Denies abdominal pain or nausea. Allowed to have ice chips and hard candy.  He does not know if he's lost weight or not. No current weight available. Last weighed 6/3. Labs and medications reviewed. K 3.0 (L).  TPN to start tonight at 30 ml/hr. Provides 698 kcals and 43 gm of protein. Meets ~33% of pt's estimated needs.  NUTRITION - FOCUSED PHYSICAL EXAM:  Deferred.  Diet Order:   Diet Order           Diet NPO time specified Except for: Ice Chips  Diet effective now         EDUCATION NEEDS:   No education needs have been identified at this time  Skin:  Skin Assessment: Reviewed RN Assessment  Last BM:  6/9   Intake/Output Summary (Last 24 hours) at 01/15/2018 1636 Last data filed at 01/15/2018 1556 Gross per 24 hour  Intake 2135.58 ml  Output 2575 ml  Net -439.42 ml   Height:   Ht Readings from Last 1 Encounters:  01/08/18 6\' 1"  (1.854 m)   Weight:   Wt Readings from Last 1 Encounters:  01/08/18 184 lb (83.5 kg)   Ideal Body  Weight:  83.6 kg  BMI:  Body mass index is 24.28 kg/m.  Estimated Nutritional Needs:   Kcal:  2100-2300  Protein:  105-120 gm  Fluid:  2.1-2.3 L  Maureen ChattersKatie Patricia Fargo, RD, LDN Pager #: [phone] After-Hours Pager #: [phone]

## 2018-01-15 NOTE — Progress Notes (Signed)
ANTICOAGULATION CONSULT NOTE - Follow-Up  Pharmacy Consult for Heparin Indication: pulmonary embolus  No Known Allergies  Patient Measurements: Height: 6\' 1"  (185.4 cm) Weight: 184 lb (83.5 kg) IBW/kg (Calculated) : 79.9  Vital Signs: Temp: 98.9 F (37.2 C) (06/09 2325) Temp Source: Oral (06/09 2325) BP: 126/103 (06/10 0823) Pulse Rate: 84 (06/10 0823)  Labs: Recent Labs    01/13/18 0515 01/13/18 1720 01/14/18 0210 01/14/18 1843 01/15/18 0455 01/15/18 0540  HGB 13.7 13.4 13.1 14.6  --  13.8  HCT 41.7 40.8 39.8 44.7  --  42.4  PLT 204  --  216  --   --  231  APTT 102* 67* 78*  --   --   --   HEPARINUNFRC 0.71* 0.33 0.38  --  0.43  --   CREATININE  --   --  0.78  --   --  0.79    Estimated Creatinine Clearance: 99.9 mL/min (by C-G formula based on SCr of 0.79 mg/dL).   Medical History: Past Medical History:  Diagnosis Date  . BPH (benign prostatic hyperplasia)   . CVA (cerebral vascular accident) (HCC) 1984   left side affected.    . Hypercholesterolemia     Assessment: 67yo male c/o abdominal pain, CT reveals PE (possibly subacute) to begin heparin; of note, pt had PE in April 2018 tx'd w/ Pradaxa, which has since been d/c'd. Transitioned to Xarelto 6/5, last dose 6/5 at 1700, now with new SBO, and transitioned back to Heparin.  Heparin level this morning remains therapeutic (HL 0.43 << 0.38, goal of 0.3-0.7). CBC wnl. No bleeding noted.   Goal of Therapy:  Heparin level 0.3-0.7 units/ml Monitor platelets by anticoagulation protocol: Yes    Plan:  Continue heparin infusion at 1650 units/hr  Monitor daily HL, CBC, for s/sx of bleeding Follow up when able to transition to Xarelto  Thank you for allowing pharmacy to be a part of this patient's care.  Georgina PillionElizabeth Taino Maertens, PharmD, BCPS Clinical Pharmacist Pager: [phone] Clinical phone for 01/15/2018 from 7a-3:30p: [phone] If after 3:30p, please call main pharmacy at: [phone] 01/15/2018 9:45 AM

## 2018-01-16 ENCOUNTER — Encounter (HOSPITAL_COMMUNITY): Payer: Self-pay | Admitting: Internal Medicine

## 2018-01-16 DIAGNOSIS — I5033 Acute on chronic diastolic (congestive) heart failure: Secondary | ICD-10-CM | POA: Diagnosis not present

## 2018-01-16 DIAGNOSIS — N179 Acute kidney failure, unspecified: Secondary | ICD-10-CM

## 2018-01-16 DIAGNOSIS — I5032 Chronic diastolic (congestive) heart failure: Secondary | ICD-10-CM | POA: Insufficient documentation

## 2018-01-16 DIAGNOSIS — I2699 Other pulmonary embolism without acute cor pulmonale: Principal | ICD-10-CM

## 2018-01-16 DIAGNOSIS — K529 Noninfective gastroenteritis and colitis, unspecified: Secondary | ICD-10-CM

## 2018-01-16 HISTORY — DX: Chronic diastolic (congestive) heart failure: I50.32

## 2018-01-16 LAB — DIFFERENTIAL
Abs Immature Granulocytes: 0.4 10*3/uL — ABNORMAL HIGH (ref 0.0–0.1)
BASOS PCT: 1 %
Basophils Absolute: 0 10*3/uL (ref 0.0–0.1)
Eosinophils Absolute: 0.4 10*3/uL (ref 0.0–0.7)
Eosinophils Relative: 5 %
Immature Granulocytes: 5 %
Lymphocytes Relative: 18 %
Lymphs Abs: 1.6 10*3/uL (ref 0.7–4.0)
MONO ABS: 0.8 10*3/uL (ref 0.1–1.0)
MONOS PCT: 9 %
NEUTROS ABS: 5.5 10*3/uL (ref 1.7–7.7)
NEUTROS PCT: 62 %

## 2018-01-16 LAB — CBC
HCT: 41.4 % (ref 39.0–52.0)
HEMOGLOBIN: 13.5 g/dL (ref 13.0–17.0)
MCH: 29.2 pg (ref 26.0–34.0)
MCHC: 32.6 g/dL (ref 30.0–36.0)
MCV: 89.6 fL (ref 78.0–100.0)
Platelets: 284 10*3/uL (ref 150–400)
RBC: 4.62 MIL/uL (ref 4.22–5.81)
RDW: 13.8 % (ref 11.5–15.5)
WBC: 8.6 10*3/uL (ref 4.0–10.5)

## 2018-01-16 LAB — COMPREHENSIVE METABOLIC PANEL
ALT: 32 U/L (ref 17–63)
AST: 22 U/L (ref 15–41)
Albumin: 2.4 g/dL — ABNORMAL LOW (ref 3.5–5.0)
Alkaline Phosphatase: 284 U/L — ABNORMAL HIGH (ref 38–126)
Anion gap: 8 (ref 5–15)
BUN: 6 mg/dL (ref 6–20)
CO2: 28 mmol/L (ref 22–32)
CREATININE: 0.91 mg/dL (ref 0.61–1.24)
Calcium: 8.4 mg/dL — ABNORMAL LOW (ref 8.9–10.3)
Chloride: 107 mmol/L (ref 101–111)
Glucose, Bld: 114 mg/dL — ABNORMAL HIGH (ref 65–99)
Potassium: 3.6 mmol/L (ref 3.5–5.1)
Sodium: 143 mmol/L (ref 135–145)
TOTAL PROTEIN: 5.9 g/dL — AB (ref 6.5–8.1)
Total Bilirubin: 1.4 mg/dL — ABNORMAL HIGH (ref 0.3–1.2)

## 2018-01-16 LAB — HEMOGLOBIN AND HEMATOCRIT, BLOOD
HCT: 43.3 % (ref 39.0–52.0)
Hemoglobin: 14.3 g/dL (ref 13.0–17.0)

## 2018-01-16 LAB — GLUCOSE, CAPILLARY
Glucose-Capillary: 101 mg/dL — ABNORMAL HIGH (ref 65–99)
Glucose-Capillary: 128 mg/dL — ABNORMAL HIGH (ref 65–99)
Glucose-Capillary: 158 mg/dL — ABNORMAL HIGH (ref 65–99)

## 2018-01-16 LAB — TRIGLYCERIDES: TRIGLYCERIDES: 176 mg/dL — AB (ref <150)

## 2018-01-16 LAB — BRAIN NATRIURETIC PEPTIDE: B NATRIURETIC PEPTIDE 5: 2245.1 pg/mL — AB (ref 0.0–100.0)

## 2018-01-16 LAB — HEPARIN LEVEL (UNFRACTIONATED): Heparin Unfractionated: 0.43 IU/mL (ref 0.30–0.70)

## 2018-01-16 LAB — PREALBUMIN: Prealbumin: 12 mg/dL — ABNORMAL LOW (ref 18–38)

## 2018-01-16 LAB — PHOSPHORUS: Phosphorus: 2.2 mg/dL — ABNORMAL LOW (ref 2.5–4.6)

## 2018-01-16 LAB — MAGNESIUM: MAGNESIUM: 2.1 mg/dL (ref 1.7–2.4)

## 2018-01-16 MED ORDER — FUROSEMIDE 10 MG/ML IJ SOLN
20.0000 mg | Freq: Two times a day (BID) | INTRAMUSCULAR | Status: DC
  Administered 2018-01-16 – 2018-01-17 (×3): 20 mg via INTRAVENOUS
  Filled 2018-01-16 (×3): qty 2

## 2018-01-16 MED ORDER — BOOST / RESOURCE BREEZE PO LIQD CUSTOM
1.0000 | Freq: Three times a day (TID) | ORAL | Status: DC
  Administered 2018-01-16 – 2018-01-18 (×8): 1 via ORAL
  Filled 2018-01-16: qty 1

## 2018-01-16 NOTE — Progress Notes (Signed)
ANTICOAGULATION CONSULT NOTE - Follow-Up  Pharmacy Consult for Heparin Indication: pulmonary embolus  No Known Allergies  Patient Measurements: Height: 6\' 1"  (185.4 cm) Weight: 180 lb 5.4 oz (81.8 kg) IBW/kg (Calculated) : 79.9  Vital Signs: Temp: 98.6 F (37 C) (06/10 2244) Temp Source: Oral (06/10 2244) BP: 127/97 (06/10 2244) Pulse Rate: 82 (06/10 2244)  Labs: Recent Labs    01/13/18 1720 01/14/18 0210  01/15/18 0455 01/15/18 0540 01/15/18 1813 01/16/18 0544 01/16/18 0556  HGB 13.4 13.1   < >  --  13.8 14.9  --  13.5  HCT 40.8 39.8   < >  --  42.4 45.1  --  41.4  PLT  --  216  --   --  231  --   --  284  APTT 67* 78*  --   --   --   --   --   --   HEPARINUNFRC 0.33 0.38  --  0.43  --   --  0.43  --   CREATININE  --  0.78  --   --  0.79  --   --  0.91   < > = values in this interval not displayed.    Estimated Creatinine Clearance: 87.8 mL/min (by C-G formula based on SCr of 0.91 mg/dL).   Medical History: Past Medical History:  Diagnosis Date  . BPH (benign prostatic hyperplasia)   . CVA (cerebral vascular accident) (HCC) 1984   left side affected.    . Hypercholesterolemia     Assessment: 67yo male c/o abdominal pain, CT reveals PE (possibly subacute) to begin heparin; of note, pt had PE in April 2018 tx'd w/ Pradaxa, which has since been d/c'd. Transitioned to Xarelto 6/5, last dose 6/5 at 1700, now with new SBO, and transitioned back to Heparin.  Heparin level this morning remains therapeutic (HL 0.43, goal of 0.3-0.7). CBC wnl. No bleeding noted.   Goal of Therapy:  Heparin level 0.3-0.7 units/ml Monitor platelets by anticoagulation protocol: Yes    Plan:  Continue heparin infusion at 1650 units/hr  Monitor daily HL, CBC, for s/sx of bleeding Follow up when able to transition to Xarelto  Thank you for allowing pharmacy to be a part of this patient's care.  Georgina PillionElizabeth Ladainian Therien, PharmD, BCPS Clinical Pharmacist Pager: [phone] Clinical phone for  01/16/2018 from 7a-3:30p: [phone] If after 3:30p, please call main pharmacy at: [phone] 01/16/2018 8:11 AM

## 2018-01-16 NOTE — Progress Notes (Signed)
Patient ID: Jason Myers, male   DOB: [birth year], 67 y.o.   MRN: [id number]   Just spoke to Dr Rito EhrlichKrishnan  Will DC order for tunneled central catheter now Pt doing better and no needs

## 2018-01-16 NOTE — Progress Notes (Signed)
OT Note - Addendum    01/16/18 1400  OT Visit Information  Last OT Received On 01/16/18  OT Time Calculation  OT Start Time (ACUTE ONLY) 1010  OT Stop Time (ACUTE ONLY) 1046  OT Time Calculation (min) 36 min  OT General Charges  $OT Visit 1 Visit  OT Evaluation  $OT Eval Moderate Complexity 1 Mod  OT Treatments  $Self Care/Home Management  8-22 mins  Luisa DagoHilary Christipher Rieger, OT/L  OT Clinical Specialist [phone]

## 2018-01-16 NOTE — Progress Notes (Signed)
   [facility]  Assessment and Plan: Patient in need of venous access.    Tunneled central catheter placement IV access; TPN; antibiotics ______________________________________________________________________   History of Present Illness: Jason DanielFrank F Smarr is a 67 y.o. 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   Review of Systems: A 12 point ROS discussed and pertinent positives are indicated in the HPI above.  All other systems are negative.   Vital Signs: BP (!) 130/93 (BP Location: Right Arm)   Pulse 93   Temp 98.1 F (36.7 C) (Oral)   Resp (!) 22   Ht 6\' 1"  (1.854 m)   Wt 180 lb 5.4 oz (81.8 kg)   SpO2 92%   BMI 23.79 kg/m   Physical Exam  Constitutional: He is oriented to person, place, and time.  Right side without deficit Left side paralysis  Cardiovascular: Normal rate.  Pulmonary/Chest: Effort normal.  Abdominal: Normal appearance. Bowel sounds are decreased.  Neurological: He is alert and oriented to person, place, and time.  Skin: Skin is warm and dry.  Psychiatric: He has a normal mood and affect. His behavior is normal.  Vitals reviewed.    Imaging reviewed.   Labs:  COAGS: Recent Labs    01/09/18 0221  01/12/18 1027 01/13/18 0515 01/13/18 1720 01/14/18 0210  INR 1.06  --   --   --   --   --   APTT  --    < > 63* 102* 67* 78*   < > = values in this interval not displayed.    BMP: Recent Labs    01/12/18 0546 01/14/18 0210 01/15/18 0540 01/16/18 0556  NA 132* 141 140 143  K 4.5 3.1* 3.0* 3.6  CL 94* 105 105 107  CO2 25 27 26 28   GLUCOSE 167* 110* 115* 114*  BUN 11 10 9 6   CALCIUM 9.8 7.8* 8.0* 8.4*  CREATININE  1.05 0.78 0.79 0.91  GFRNONAA >60 >60 >60 >60  GFRAA >60 >60 >60 >60       Electronically Signed: Na Waldrip A, PA-C 01/16/2018, 11:32 AM   I spent a total of 15 minutes in face to face in clinical consultation, greater than 50% of which was counseling/coordinating care for venous access.Patient ID: Jason Myers, male   DOB: [birth year], 67 y.o.   MRN: [id number]

## 2018-01-16 NOTE — Consult Note (Signed)
Physical Medicine and Rehabilitation Consult  Reason for Consult: Debility  Referring Physician: Dr. Rito Ehrlich   HPI: Jason Myers is a 67 y.o. male with history of BPH, CVA with left sided weakness, PE/DVT, recent use of NSAIDS; who was admitted on 01/09/18 with abdominal pain, hypotension and hypoxia. He was found to have AKI, right sided PE as well as ilieitis/enteritis. He was started on IVF as well as IV heparin and broad spectrum antibiotics. Hospital course significant for ileus treated with NGT and heme positive bilious output. GI recommended monitoring as H/H relatively stable and no signs of acute bleed. Surgery recommended supportive care and as abdominal symptoms improved NGT clamped. Question trials of clears?To start TNA for nutritional support.  Patient noted to be debilitated and CIR recommended due to functional deficits. Patient with multiple concerns about losing his apartment and would like to talk with SW prior to making decision.     Review of Systems  Eyes: Negative for blurred vision and double vision.  Respiratory: Negative for cough and shortness of breath.   Cardiovascular: Negative for chest pain and palpitations.  Gastrointestinal: Negative for abdominal pain, heartburn and nausea.       Occasional incontinence.   Genitourinary: Positive for urgency.       Dribbles. Occasional incontinence.   Musculoskeletal: Positive for falls (due to left foot drop).  Neurological: Positive for focal weakness. Negative for dizziness and headaches.  Psychiatric/Behavioral: Negative for memory loss. The patient is not nervous/anxious.       Past Medical History:  Diagnosis Date  . BPH (benign prostatic hyperplasia)   . Chronic diastolic CHF (congestive heart failure) (HCC) 01/16/2018  . CVA (cerebral vascular accident) (HCC) 1984   left side affected.    . Hypercholesterolemia     Past Surgical History:  Procedure Laterality Date  . LIPOMA EXCISION Left    groin    Family History  Problem Relation Age of Onset  . Alcoholism Mother   . Kidney Stones Father     Social History: Lives alone. Has an CNA 3 X week to assist with home management but has been undependable. Marland Kitchen He reports that he has quit smoking. He has never used smokeless tobacco. He reports that he drinks beer occasionally. He reports that he has current or past drug history. Drug: Marijuana.    Allergies: No Known Allergies    Medications Prior to Admission  Medication Sig Dispense Refill  . rosuvastatin (CRESTOR) 10 MG tablet Take 10 mg by mouth daily.    . tamsulosin (FLOMAX) 0.4 MG CAPS capsule Take 0.4 mg by mouth at bedtime.     . dabigatran (PRADAXA) 150 MG CAPS capsule Take 1 capsule (150 mg total) by mouth every 12 (twelve) hours. (Patient not taking: Reported on 01/09/2018) 60 capsule 3    Home: Home Living Family/patient expects to be discharged to:: Private residence Living Arrangements: Alone Available Help at Discharge: Available PRN/intermittently, Friend(s) Type of Home: Apartment(Public Housing ) Home Access: Elevator Home Layout: One level Bathroom Shower/Tub: Engineer, manufacturing systems: Standard Bathroom Accessibility: Yes Home Equipment: Cane - single point, Grab bars - tub/shower Additional Comments: rarely used Chiropodist bed;brother lives close by, but does not provide much assistance  Functional History: Prior Function Level of Independence: Needs assistance Gait / Transfers Assistance Needed: limited community ambulation ADL's / Homemaking Assistance Needed: independent with ADLS, has aide 3x/wk to assist with iADLs;participates in senior nutrition program for meals 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, 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 Functional Status:  Mobility: Bed Mobility Overal bed mobility: Needs Assistance Bed Mobility: Supine to Sit Supine to sit: Mod assist Sit to supine: Mod assist General bed mobility comments: modA for trunk to  upright;pt stated he is significantly deconditioned Transfers Overall transfer level: Needs assistance Equipment used: 1 person hand held assist Transfers: Sit to/from Stand Sit to Stand: Mod assist General transfer comment: modA for powerup and steadying Ambulation/Gait Ambulation/Gait assistance: Mod assist Ambulation Distance (Feet): 3 Feet Assistive device: 1 person hand held assist Gait Pattern/deviations: Decreased dorsiflexion - left, Decreased stance time - left, Step-to pattern, Shuffle General Gait Details: modA for taking steps forward and pivoting to chair, decreased ability to lift and move L LE  Gait velocity: slow Gait velocity interpretation: <1.31 ft/sec, indicative of household ambulator    ADL: ADL Overall ADL's : Needs assistance/impaired Eating/Feeding: Set up Grooming: Set up Upper Body Bathing: Set up Lower Body Bathing: Minimal assistance Upper Body Dressing : Set up Lower Body Dressing: Minimal assistance Toilet Transfer: Moderate assistance, Ambulation, Regular Toilet Toilet Transfer Details (indicate cue type and reason): successful bowel movement;pt unable to [employer] bars on L side; Assistance required for sit<>stand Toileting- Architect and Hygiene: Sit to/from stand, Minimal assistance Functional mobility during ADLs: Moderate assistance, Cueing for safety(used IV pole for stability during ambulation;cue for safety) General ADL Comments: heavily dependent on RUE for ADLs   Cognition: Cognition Overall Cognitive Status: Within Functional Limits for tasks assessed Orientation Level: Oriented X4 Cognition Arousal/Alertness: Awake/alert Behavior During Therapy: WFL for tasks assessed/performed Overall Cognitive Status: Within Functional Limits for tasks assessed General Comments: pt appeared anxious regarding home situation  Blood pressure (!) 130/93, pulse 93, temperature 98.1 F (36.7 C), temperature source Oral, resp. rate (!)  22, height 6\' 1"  (1.854 m), weight 81.8 kg (180 lb 5.4 oz), SpO2 92 %. Physical Exam  Nursing note and vitals reviewed. Constitutional:  Oxygen in place.   Neurological:  Left facial weakness with left hemiparesis. LUE with flexion contracture. Left foot drop with inversion.     Results for orders placed or performed during the hospital encounter of 01/08/18 (from the past 24 hour(s))  Hemoglobin and hematocrit, blood     Status: None   Collection Time: 01/15/18  6:13 PM  Result Value Ref Range   Hemoglobin 14.9 13.0 - 17.0 g/dL   HCT 29.5 62.1 - 30.8 %  Glucose, capillary     Status: Abnormal   Collection Time: 01/15/18 10:38 PM  Result Value Ref Range   Glucose-Capillary 111 (H) 65 - 99 mg/dL  Heparin level (unfractionated)     Status: None   Collection Time: 01/16/18  5:44 AM  Result Value Ref Range   Heparin Unfractionated 0.43 0.30 - 0.70 IU/mL  Comprehensive metabolic panel     Status: Abnormal   Collection Time: 01/16/18  5:56 AM  Result Value Ref Range   Sodium 143 135 - 145 mmol/L   Potassium 3.6 3.5 - 5.1 mmol/L   Chloride 107 101 - 111 mmol/L   CO2 28 22 - 32 mmol/L   Glucose, Bld 114 (H) 65 - 99 mg/dL   BUN 6 6 - 20 mg/dL   Creatinine, Ser 6.57 0.61 - 1.24 mg/dL   Calcium 8.4 (L) 8.9 - 10.3 mg/dL   Total Protein 5.9 (L) 6.5 - 8.1 g/dL   Albumin 2.4 (L) 3.5 - 5.0 g/dL   AST 22 15 - 41 U/L   ALT 32 17 -  63 U/L   Alkaline Phosphatase 284 (H) 38 - 126 U/L   Total Bilirubin 1.4 (H) 0.3 - 1.2 mg/dL   GFR calc non Af Amer >60 >60 mL/min   GFR calc Af Amer >60 >60 mL/min   Anion gap 8 5 - 15  Prealbumin     Status: Abnormal   Collection Time: 01/16/18  5:56 AM  Result Value Ref Range   Prealbumin 12.0 (L) 18 - 38 mg/dL  Magnesium     Status: None   Collection Time: 01/16/18  5:56 AM  Result Value Ref Range   Magnesium 2.1 1.7 - 2.4 mg/dL  Phosphorus     Status: Abnormal   Collection Time: 01/16/18  5:56 AM  Result Value Ref Range   Phosphorus 2.2 (L) 2.5 -  4.6 mg/dL  CBC     Status: None   Collection Time: 01/16/18  5:56 AM  Result Value Ref Range   WBC 8.6 4.0 - 10.5 K/uL   RBC 4.62 4.22 - 5.81 MIL/uL   Hemoglobin 13.5 13.0 - 17.0 g/dL   HCT 16.1 09.6 - 04.5 %   MCV 89.6 78.0 - 100.0 fL   MCH 29.2 26.0 - 34.0 pg   MCHC 32.6 30.0 - 36.0 g/dL   RDW 40.9 81.1 - 91.4 %   Platelets 284 150 - 400 K/uL  Differential     Status: Abnormal   Collection Time: 01/16/18  5:56 AM  Result Value Ref Range   Neutrophils Relative % 62 %   Neutro Abs 5.5 1.7 - 7.7 K/uL   Lymphocytes Relative 18 %   Lymphs Abs 1.6 0.7 - 4.0 K/uL   Monocytes Relative 9 %   Monocytes Absolute 0.8 0.1 - 1.0 K/uL   Eosinophils Relative 5 %   Eosinophils Absolute 0.4 0.0 - 0.7 K/uL   Basophils Relative 1 %   Basophils Absolute 0.0 0.0 - 0.1 K/uL   Immature Granulocytes 5 %   Abs Immature Granulocytes 0.4 (H) 0.0 - 0.1 K/uL  Triglycerides     Status: Abnormal   Collection Time: 01/16/18  5:56 AM  Result Value Ref Range   Triglycerides 176 (H) <150 mg/dL  Glucose, capillary     Status: Abnormal   Collection Time: 01/16/18  6:23 AM  Result Value Ref Range   Glucose-Capillary 101 (H) 65 - 99 mg/dL  Brain natriuretic peptide     Status: Abnormal   Collection Time: 01/16/18 10:51 AM  Result Value Ref Range   B Natriuretic Peptide 2,245.1 (H) 0.0 - 100.0 pg/mL  Glucose, capillary     Status: Abnormal   Collection Time: 01/16/18 12:14 PM  Result Value Ref Range   Glucose-Capillary 128 (H) 65 - 99 mg/dL   Dg Abd Portable 1v  Result Date: 01/15/2018 CLINICAL DATA:  Small bowel obstruction. EXAM: PORTABLE ABDOMEN - 1 VIEW COMPARISON:  01/14/2018 FINDINGS: There is a nasogastric tube which is coiled in the left upper quadrant of the abdomen with tip projecting over the body of stomach. Gaseous distension of the large and small bowel loops are again noted. Not significantly improved from previous exam. No air-fluid levels identified. IMPRESSION: 1. No change in the  appearance of small bowel obstruction pattern. Electronically Signed   By: Signa Kell M.D.   On: 01/15/2018 09:03   [place] Ekg Site Rite  Result Date: 01/15/2018 If Site Rite image not attached, placement could not be confirmed due to current cardiac rhythm.    Assessment/Plan: Diagnosis: 67 yo  male with history of left hemiparesis after CVA now with debility due to multiple medical issues as outlined above 1. Does the need for close, 24 hr/day medical supervision in concert with the patient's rehab needs make it unreasonable for this patient to be served in a less intensive setting? Yes 2. Co-Morbidities requiring supervision/potential complications: AKI, PE,CHF, BPH, nutrition TNA 3. Due to bladder management, bowel management, safety, skin/wound care, disease management, medication administration, pain management and patient education, does the patient require 24 hr/day rehab nursing? Yes 4. Does the patient require coordinated care of a physician, rehab nurse, PT (1-2 hrs/day, 5 days/week) and OT (1-2 hrs/day, 5 days/week) to address physical and functional deficits in the context of the above medical diagnosis(es)? Yes Addressing deficits in the following areas: balance, endurance, locomotion, strength, transferring, bowel/bladder control, bathing, dressing, feeding, grooming and psychosocial support 5. Can the patient actively participate in an intensive therapy program of at least 3 hrs of therapy per day at least 5 days per week? Yes 6. The potential for patient to make measurable gains while on inpatient rehab is excellent 7. Anticipated functional outcomes upon discharge from inpatient rehab are modified independent and supervision  with PT, modified independent and supervision with OT, n/a with SLP. 8. Estimated rehab length of stay to reach the above functional goals is: 12-16 days 9. Anticipated D/C setting: Home 10. Anticipated post D/C treatments: HH therapy and Outpatient  therapy 11. Overall Rehab/Functional Prognosis: excellent  RECOMMENDATIONS: This patient's condition is appropriate for continued rehabilitative care in the following setting: CIR Patient has agreed to participate in recommended program. Yes Note that insurance prior authorization may be required for reimbursement for recommended care.  Comment: Rehab Admissions Coordinator to follow up.  Thanks,  Ranelle OysterZachary T. Swartz, MD, [place] DomFAAPMR  I have personally performed a face to face diagnostic evaluation of this patient. Additionally, I have reviewed and concur with the physician assistant's documentation above.     Jacquelynn Creeamela S Love, PA-C 01/16/2018

## 2018-01-16 NOTE — Progress Notes (Signed)
Occupational Therapy Evaluation Patient Details Name: Jason Myers MRN: [id number] DOB: 09-04-1950 Today's Date: 01/16/2018    History of Present Illness 67 y.o. male with medical history significant of hemorrhagic stroke s/p ruptured aneurysm in 1984, history of DVT/PE, HLD, and BPH; who presents with complaints of generalized abdominal pain was found to have enteritis , was started on zosyn. He was also found to have acute PE/ DVT AND was started on IV heparin. abd x ray showing  high grade SBO. NG tube placed and surgery consulted.   Clinical Impression   PTA, pt was living alone, and was modified independent with ADLs and functional mobility using a SPC as needed and had a PCA 3x/wk to assist with IADLs. Pt is significantly deconditioned and requires modA for ADL functional mobility. During functional mobility HR spiked to 154 bpm and returned to 124 bpm with return to sitting. Upon arrival, 02 92% on 3lnc, pt was able to maintain O2 above 92% on RA during light activity. Pt appears hesitant to additional rehab due to fear of losing his home. Due to decline in current level of function, recommend rehab at CIR to facilitate safe D/C home at modified independent level. If patient does not agree to CIR, pt would need HHOT and HHAide to maximize independence and safety with ADLs and functional mobility. Will follow acutely to address established goals and D/C to next venue of care.  O2 92% RA sitting; maintained >92% with activity HR 96 bpm sitting; 154 pmb with activity; 124bpm after activity     Follow Up Recommendations  CIR;Supervision/Assistance - 24 hour    Equipment Recommendations  None recommended by OT    Recommendations for Other Services       Precautions / Restrictions Precautions Precautions: Fall Restrictions Weight Bearing Restrictions: No      Mobility Bed Mobility Overal bed mobility: Needs Assistance Bed Mobility: Supine to Sit     Supine to sit: Mod  assist Sit to supine: Mod assist   General bed mobility comments: modA for trunk to upright;pt stated he is significantly deconditioned  Transfers Overall transfer level: Needs assistance Equipment used: 1 person hand held assist Transfers: Sit to/from Stand Sit to Stand: Mod assist         General transfer comment: modA for powerup and steadying    Balance Overall balance assessment: Needs assistance Sitting-balance support: No upper extremity supported;Feet supported Sitting balance-Leahy Scale: Fair     Standing balance support: Single extremity supported Standing balance-Leahy Scale: Poor Standing balance comment: requires modA for standing                            ADL either performed or assessed with clinical judgement   ADL Overall ADL's : Needs assistance/impaired Eating/Feeding: Set up   Grooming: Set up   Upper Body Bathing: Set up   Lower Body Bathing: Minimal assistance   Upper Body Dressing : Set up   Lower Body Dressing: Minimal assistance   Toilet Transfer: Moderate assistance;Ambulation;Regular Teacher, adult educationToilet Toilet Transfer Details (indicate cue type and reason): successful bowel movement;pt unable to [employer] grab bars on L side; Assistance required for sit<>stand Toileting- ArchitectClothing Manipulation and Hygiene: Sit to/from stand;Minimal assistance       Functional mobility during ADLs: Moderate assistance;Cueing for safety(used IV pole for stability during ambulation;cue for safety) General ADL Comments: heavily dependent on RUE for ADLs      Vision Baseline Vision/History: Wears glasses Wears Glasses:  At all times Patient Visual Report: No change from baseline       Perception     Praxis      Pertinent Vitals/Pain Pain Assessment: No/denies pain Pain Location: abdomen Pain Descriptors / Indicators: Tender     Hand Dominance Right   Extremity/Trunk Assessment Upper Extremity Assessment Upper Extremity Assessment: LUE  deficits/detail LUE Deficits / Details: limited by prior hemorrhagic stroke; elbow in flexed posture, and non-functional  LUE Coordination: decreased fine motor;decreased gross motor   Lower Extremity Assessment Lower Extremity Assessment: Defer to PT evaluation    Cervical / Trunk Assessment Cervical / Trunk Assessment: Kyphotic   Communication Communication Communication: No difficulties   Cognition Arousal/Alertness: Awake/alert Behavior During Therapy: WFL for tasks assessed/performed Overall Cognitive Status: Within Functional Limits for tasks assessed                                 General Comments: pt appeared anxious regarding home situation   General Comments  Pt fatigued quickly with ambulation to toilet, reported fatigue secondary to chest congestion/cough;Pt on 3lnc upon arrival; able to maintain 02 above 92% on RA wifh functional mobility; HR 96 sitting;during activity HR spiked to 154; HR 124 with return to sitting; Pt has first edition novels at home and is deeply concerned with losing his home and all his belongings.     Exercises     Shoulder Instructions      Home Living Family/patient expects to be discharged to:: Private residence Living Arrangements: Alone Available Help at Discharge: Available PRN/intermittently;Friend(s) Type of Home: Apartment(Public Housing ) Home Access: Elevator     Home Layout: One level     Bathroom Shower/Tub: Chief Strategy Officer: Standard Bathroom Accessibility: Yes   Home Equipment: Cane - single point;Grab bars - tub/shower   Additional Comments: rarely used cane;regular bed;brother lives close by, but does not provide much assistance      Prior Functioning/Environment Level of Independence: Needs assistance  Gait / Transfers Assistance Needed: limited community ambulation ADL's / Homemaking Assistance Needed: independent with ADLS, has aide 3x/wk to assist with iADLs;participates in  senior nutrition program for meals   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, 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        OT Problem List: Decreased strength;Decreased range of motion;Decreased activity tolerance;Impaired balance (sitting and/or standing);Decreased coordination;Impaired UE functional use;Cardiopulmonary status limiting activity      OT Treatment/Interventions: Self-care/ADL training;Therapeutic exercise;Energy conservation;Therapeutic activities;Patient/family education;Balance training    OT Goals(Current goals can be found in the care plan section) Acute Rehab OT Goals Patient Stated Goal: to go home OT Goal Formulation: With patient Time For Goal Achievement: 01/30/18 Potential to Achieve Goals: Good ADL Goals Pt Will Transfer to Toilet: ambulating;with supervision;regular height toilet Pt Will Perform Toileting - Clothing Manipulation and hygiene: with supervision;sit to/from stand Additional ADL Goal #1: Pt will demonstrate understanding of 3 strategies for fall prevention.  OT Frequency: Min 2X/week   Barriers to D/C: Decreased caregiver support          Co-evaluation              AM-PAC PT "6 Clicks" Daily Activity     Outcome Measure Help from another person eating meals?: None Help from another person taking care of personal grooming?: A Little Help from another person toileting, which includes using toliet, bedpan, or urinal?: A Lot Help from another person bathing (including washing, rinsing,  drying)?: A Little Help from another person to put on and taking off regular upper body clothing?: A Little Help from another person to put on and taking off regular lower body clothing?: A Little 6 Click Score: 18   End of Session Equipment Utilized During Treatment: Gait belt Nurse Communication: Mobility status  Activity Tolerance: Patient limited by fatigue Patient left: in chair;with call bell/phone within reach  OT Visit Diagnosis: Unsteadiness on feet  (R26.81);Feeding difficulties (R63.3);Muscle weakness (generalized) (M62.81);Other abnormalities of gait and mobility (R26.89)                Time: 1010-1046 OT Time Calculation (min): 36 min Charges:    G-Codes:     Diona Browner OTS     01/16/2018, 1:44 PM

## 2018-01-16 NOTE — Progress Notes (Signed)
Inpatient Rehabilitation Admissions Coordinator  Therapy continue to recommend inpt rehab consult. I will contact Dr. Rito EhrlichKrishnan for possible order.  Ottie GlazierBarbara Morrison Masser, RN, MSN Rehab Admissions Coordinator [phone] 01/16/2018 1:51 PM

## 2018-01-16 NOTE — [place] FL2 (Signed)
[place] MEDICAID FL2 LEVEL OF CARE SCREENING TOOL     IDENTIFICATION  Patient Name: Jason Myers Birthdate: Aug 03, 1951 Sex: male Admission Date (Current Location): 01/08/2018  [facility] and [place] Number:  Producer, television/film/video and Address:  The . [facility], [address] [place], [place] [address]      Provider Number: [id number]  Attending Physician Name and Address:  Hollice Espy, MD  Relative Name and Phone Number:       Current Level of Care: Hospital Recommended Level of Care: Skilled Nursing Facility Prior Approval Number:    Date Approved/Denied:   PASRR Number: [id number]  Discharge Plan: SNF    Current Diagnoses: Patient Active Problem List   Diagnosis Date Noted  . Chronic diastolic CHF (congestive heart failure) (HCC) 01/16/2018  . Pulmonary embolus (HCC) 01/09/2018  . Hypoxia 01/09/2018  . AKI (acute kidney injury) (HCC) 01/09/2018  . Hypotension 01/09/2018  . Pulmonary embolism (HCC) 01/09/2018  . PE (pulmonary thromboembolism) (HCC) 11/13/2016  . BPH (benign prostatic hyperplasia) 11/13/2016  . Thyroid nodule 11/13/2016  . HLD (hyperlipidemia) 06/08/2007  . HEMORRHOIDS 06/08/2007    Orientation RESPIRATION BLADDER Height & Weight     Self, Time, Situation, Place  Normal Continent Weight: 180 lb 5.4 oz (81.8 kg) Height:  6\' 1"  (185.4 cm)  BEHAVIORAL SYMPTOMS/MOOD NEUROLOGICAL BOWEL NUTRITION STATUS      Continent Diet(see DC summary)  AMBULATORY STATUS COMMUNICATION OF NEEDS Skin   Extensive Assist Verbally Normal                       Personal Care Assistance Level of Assistance  Bathing, Dressing Bathing Assistance: Maximum assistance   Dressing Assistance: Maximum assistance     Functional Limitations Info             SPECIAL CARE FACTORS FREQUENCY  PT (By licensed PT), OT (By licensed OT)     PT Frequency: 5/wk OT Frequency: 5/wk            Contractures      Additional Factors Info   Code Status, Allergies, Insulin Sliding Scale Code Status Info: FULL Allergies Info: NKA   Insulin Sliding Scale Info: 4/day       Current Medications (01/16/2018):  This is the current hospital active medication list Current Facility-Administered Medications  Medication Dose Route Frequency Provider Last Rate Last Dose  . acetaminophen (TYLENOL) tablet 650 mg  650 mg Oral Q6H PRN Madelyn Flavors A, MD   650 mg at 01/10/18 5621   Or  . acetaminophen (TYLENOL) suppository 650 mg  650 mg Rectal Q6H PRN Madelyn Flavors A, MD      . albuterol (PROVENTIL) (2.5 MG/3ML) 0.083% nebulizer solution 2.5 mg  2.5 mg Nebulization Q4H PRN Smith, Rondell A, MD      . dextrose 5 %-0.9 % sodium chloride infusion   Intravenous Continuous Kathlen Mody, MD 50 mL/hr at 01/15/18 2203    . feeding supplement (BOOST / RESOURCE BREEZE) liquid 1 Container  1 Container Oral TID BM Focht, Jessica L, PA   1 Container at 01/16/18 1056  . heparin ADULT infusion 100 units/mL (25000 units/291mL sodium chloride 0.45%)  1,650 Units/hr Intravenous Continuous Kathlen Mody, MD 16.5 mL/hr at 01/15/18 2203 1,650 Units/hr at 01/15/18 2203  . morphine 2 MG/ML injection 1-2 mg  1-2 mg Intravenous Q4H PRN Kathlen Mody, MD   2 mg at 01/10/18 1719  . ondansetron (ZOFRAN) tablet 4 mg  4  mg Oral Q6H PRN Madelyn FlavorsSmith, Rondell A, MD       Or  . ondansetron (ZOFRAN) injection 4 mg  4 mg Intravenous Q6H PRN Smith, Rondell A, MD      . rosuvastatin (CRESTOR) tablet 10 mg  10 mg Oral Daily Madelyn FlavorsSmith, Rondell A, MD   10 mg at 01/10/18 0903  . tamsulosin (FLOMAX) capsule 0.4 mg  0.4 mg Oral QHS Smith, Rondell A, MD   0.4 mg at 01/15/18 2204  . zolpidem (AMBIEN) tablet 5 mg  5 mg Oral QHS PRN Leda GauzeKirby-Graham, Karen J, NP   5 mg at 01/15/18 2204     Discharge Medications: Please see discharge summary for a list of discharge medications.  Relevant Imaging Results:  Relevant Lab Results:   Additional Information SS#: [id number]  [name], [name],  LCSW

## 2018-01-16 NOTE — Clinical Social Work Note (Signed)
Clinical Social Work Assessment  Patient Details  Name: Jason Myers MRN: [id number] Date of Birth: 10-10-1950  Date of referral:  01/16/18               Reason for consult:  Facility Placement                Permission sought to share information with:  Chartered certified accountant granted to share information::  Yes, Verbal Permission Granted  Name::     Shawn Route  Agency::  [facility], SNFs  Relationship::  Education officer, museum, Development worker, international aid Information:     Housing/Transportation Living arrangements for the past 2 months:  Tax adviser of Information:  Patient Patient Interpreter Needed:  None Criminal Activity/Legal Involvement Pertinent to Current Situation/Hospitalization:  No - Comment as needed Significant Relationships:  Siblings Lives with:  Self Do you feel safe going back to the place where you live?    Need for family participation in patient care:  No (Coment)  Care giving concerns:  Pt lives in apartment alone- has help from aid 3x week but does not find aid to be very helpful.  Patient has a brother who lives locally but cannot offer much help at this time.  Patient now with increased weakness and thinks it would be difficult for him to manage at home alone.   Social Worker assessment / plan:  CSW met with pt to discuss PT recommendation for SNF.  CSW explained SNF and SNF referral process.   Patient has concerns with losing his public housing which makes him hesitant to go into rehab.  Provided CSW verbal permission to reach out to Wendee Beavers who is Radio broadcast assistant for his apartment site, [place].  CSW spoke with Ms. Rolena Infante who states there should not be any issues with patient being absent from his apartment for medical reasons as long as it is not more than a couple of weeks.  Employment status:  Retired Nurse, adult PT Recommendations:  [place] / Referral  to community resources:  [place]  Patient/Family's Response to care:  Patient agreeable to short term rehab stay as long as his housing will remain secure.  Patient/Family's Understanding of and Emotional Response to Diagnosis, Current Treatment, and Prognosis:  Patient has good understanding of current condition and medical recommendations- hopeful he will be able to return to prior level of function soon- values his independence greatly.  Emotional Assessment Appearance:  Appears stated age Attitude/Demeanor/Rapport:    Affect (typically observed):  Appropriate, Pleasant Orientation:  Oriented to Self, Oriented to Place, Oriented to  Time, Oriented to Situation Alcohol / Substance use:  Not Applicable Psych involvement (Current and /or in the community):  No (Comment)  Discharge Needs  Concerns to be addressed:  Care Coordination Readmission within the last 30 days:  No Current discharge risk:  Physical Impairment Barriers to Discharge:  Continued Medical Work up   Jorge Ny, LCSW 01/16/2018, 11:12 AM

## 2018-01-16 NOTE — Progress Notes (Addendum)
PROGRESS NOTE    Jason DanielFrank F Myers  ZOX:[id number] DOB: 07/21/1951 DOA: 01/08/2018 PCP: Gean BirchwoodPa, [facility]    Brief Narrative:  Rocco PaulsFrank F Myers a 67 y.o.malewith medical history significant ofhemorrhagic strokes/pruptured aneurysm in 1984, history of DVT/PE, HLD, and BPH;who presents with complaints of generalized abdominal pain was found to have enteritis , was started on zosyn. He was also found to have acute PE/ DVT & was started on IV heparin.  He was later on transitioned to oral xarelto for continued anticoagulation.  On 6/6, pt became nauseated, started vomiting black emesis, which was postive for blood.  GI consulted.  Findings noted minimal drop in hemoglobin and no signs of overt bleeding.  It was felt that he was not having active GI bleed and can be monitored for the time being.  Patient's abdominal x-ray noted high-grade small bowel obstruction, so surgery consulted as well.  Patient made n.p.o., NG tube placed.  Started on IV heparin.  Since then, hemoglobin has remained stable.  No further episodes of hematemesis.  After several days, patient started passing gas.  NG tube clamped which patient tolerated well on 6/10.  Surgery plan to remove patient's NG tube today and start clear liquids.  Patient himself doing okay, complains of occasional cough with productive whitish sputum, especially when taking a deep breath.  Denies any abdominal pain.  Eager to start taking in fluids.     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  Hemoglobin has since remained stable on IV heparin.  If he tolerates clears today, we restart  Xarelto.  History of CVA with secondary left-sided hemiplegia: He was on Pradaxa in the past, but has not been taking this.  Now will start Xarelto as above  Acute on chronic diastolic heart failure: Incidentally noted on echocardiogram.  Patient appears euvolemic.  Check BNP and found to be elevated at 2250.  Have stopped IV fluids and started IV Lasix.  Monitoring strict input/output and daily weights.  Small bowel obstruction: Status post NG tube placement.  Surgery following.  Patient obstruction has since improved and will plan to remove tube today and start clear liquids.  Cough: Doubtful this is pneumonia given the fact that he is already been on cefepime.  We will plan to start inspirometer   Hypokalemia: replaced.   AKI:  Improved with hydration.  Creatinine at baseline.   Hyperlipidemia:  Resume crestor.   BPH:  On flomax.   DVT prophylaxis: heparin.  Change to Xarelto on 6/12 Code Status:  Full code.  Family Communication: none at bedside.  Disposition Plan: Potential discharge in the next 48 hours, as we advance diet and then make sure that his hemoglobin remained stable on Xarelto   Consultants:   Gastroenterology  Surgery.     Procedures: Echocardiogram done 6/5: Grade 1 diastolic dysfunction noted   Antimicrobials: IV Zosyn 6/4-6/10     Objective: Vitals:   01/15/18 1637 01/15/18 1647 01/15/18 2244 01/16/18 0850  BP:  (!) 129/92 (!) 127/97 (!) 130/93  Pulse:  92 82 93  Resp:  18 16 (!) 22  Temp:  98.6 F (37 C) 98.1 F (36.7 C)  TempSrc:   Oral Oral  SpO2:  96% 93% 92%  Weight: 81.8 kg (180 lb 5.4 oz)     Height:        Intake/Output Summary (Last 24 hours) at 01/16/2018 1031 Last data filed at 01/16/2018 0900 Gross per 24 hour  Intake 2418.93 ml  Output 2150 ml  Net 268.93 ml   Filed Weights   01/08/18 2345 01/15/18 1637  Weight: 83.5 kg (184 lb) 81.8 kg (180 lb 5.4 oz)    Examination:  General exam: Alert and oriented x3, no  acute distress HEENT: Normocephalic, atraumatic, mucous membranes are slightly dry Neck: Supple, no JVD Respiratory system: No crackles or wheezes, breathing not labored Cardiovascular system: Regular rate and rhythm, S1-S2 Gastrointestinal system: Abdomen is soft , distended, no tenderness, positive bowel sounds Neuro: Left-sided hemiparesis Extremities: Symmetric 5 x 5 power.no cyanosis or clubbing. Left upper ext hemiparesis.  Skin: No skin breaks tears or lesions Psychiatry: Appropriate, no evidence of psychoses    Data Reviewed: I have personally reviewed following labs and imaging studies  CBC: Recent Labs  Lab 01/12/18 0546  01/13/18 0515  01/14/18 0210 01/14/18 1843 01/15/18 0540 01/15/18 1813 01/16/18 0556  WBC 9.2  --  10.0  --  9.1  --  9.8  --  8.6  NEUTROABS  --   --   --   --   --   --   --   --  5.5  HGB 14.5   < > 13.7   < > 13.1 14.6 13.8 14.9 13.5  HCT 43.0   < > 41.7   < > 39.8 44.7 42.4 45.1 41.4  MCV 88.3  --  89.1  --  88.4  --  89.3  --  89.6  PLT 214  --  204  --  216  --  231  --  284   < > = values in this interval not displayed.   Basic Metabolic Panel: Recent Labs  Lab 01/11/18 0944 01/12/18 0546 01/14/18 0210 01/14/18 1023 01/15/18 0540 01/16/18 0556  NA 138 132* 141  --  140 143  K 3.5 4.5 3.1*  --  3.0* 3.6  CL 101 94* 105  --  105 107  CO2 27 25 27   --  26 28  GLUCOSE 137* 167* 110*  --  115* 114*  BUN 23* 11 10  --  9 6  CREATININE 0.90 1.05 0.78  --  0.79 0.91  CALCIUM 8.8* 9.8 7.8*  --  8.0* 8.4*  MG  --   --   --  2.0  --  2.1  PHOS  --   --   --   --   --  2.2*   GFR: Estimated Creatinine Clearance: 87.8 mL/min (by C-G formula based on SCr of 0.91 mg/dL). Liver Function Tests: Recent Labs  Lab 01/11/18 0944 01/16/18 0556  AST 19 22  ALT 16* 32  ALKPHOS 54 284*  BILITOT 1.0 1.4*  PROT 6.3* 5.9*  ALBUMIN 2.9* 2.4*   No results for input(s): LIPASE, AMYLASE in the last 168 hours. No results for input(s): AMMONIA in  the last 168 hours. Coagulation Profile: No results for input(s): INR, PROTIME in the last 168 hours. Cardiac Enzymes: No results for input(s): CKTOTAL, CKMB, CKMBINDEX, TROPONINI in the last 168 hours. BNP (last 3 results) No results for input(s): PROBNP in the last 8760 hours. HbA1C: No results for input(s):  HGBA1C in the last 72 hours. CBG: Recent Labs  Lab 01/12/18 1048 01/12/18 1153 01/15/18 2238 01/16/18 0623  GLUCAP 83 84 111* 101*   Lipid Profile: Recent Labs    01/16/18 0556  TRIG 176*   Thyroid Function Tests: No results for input(s): TSH, T4TOTAL, FREET4, T3FREE, THYROIDAB in the last 72 hours. Anemia Panel: No results for input(s): VITAMINB12, FOLATE, FERRITIN, TIBC, IRON, RETICCTPCT in the last 72 hours. Sepsis Labs: Recent Labs  Lab 01/11/18 0944  LATICACIDVEN 1.4    Recent Results (from the past 240 hour(s))  Blood Culture (routine x 2)     Status: None   Collection Time: 01/09/18  2:15 AM  Result Value Ref Range Status   Specimen Description BLOOD RIGHT HAND  Final   Special Requests   Final    BOTTLES DRAWN AEROBIC AND ANAEROBIC Blood Culture results may not be optimal due to an inadequate volume of blood received in culture bottles   Culture   Final    NO GROWTH 5 DAYS Performed at [facility], [address] [place], [place] [address]    Report Status 01/14/2018 FINAL  Final  Blood Culture (routine x 2)     Status: None   Collection Time: 01/09/18  2:20 AM  Result Value Ref Range Status   Specimen Description BLOOD RIGHT FOREARM  Final   Special Requests   Final    BOTTLES DRAWN AEROBIC AND ANAEROBIC Blood Culture adequate volume   Culture   Final    NO GROWTH 5 DAYS Performed at [facility], [address], [place], [place] [address]    Report Status 01/14/2018 FINAL  Final         Radiology Studies: Dg Abd Portable 1v  Result Date: 01/15/2018 CLINICAL DATA:  Small bowel obstruction. EXAM: PORTABLE ABDOMEN - 1 VIEW  COMPARISON:  01/14/2018 FINDINGS: There is a nasogastric tube which is coiled in the left upper quadrant of the abdomen with tip projecting over the body of stomach. Gaseous distension of the large and small bowel loops are again noted. Not significantly improved from previous exam. No air-fluid levels identified. IMPRESSION: 1. No change in the appearance of small bowel obstruction pattern. Electronically Signed   By: Signa Kell M.D.   On: 01/15/2018 09:03   [place] Ekg Site Rite  Result Date: 01/15/2018 If Site Rite image not attached, placement could not be confirmed due to current cardiac rhythm.       Scheduled Meds: . feeding supplement  1 Container Oral TID BM  . insulin aspart  0-9 Units Subcutaneous Q6H  . rosuvastatin  10 mg Oral Daily  . tamsulosin  0.4 mg Oral QHS   Continuous Infusions: . dextrose 5 % and 0.9% NaCl 50 mL/hr at 01/15/18 2203  . heparin 1,650 Units/hr (01/15/18 2203)     LOS: 7 days     Hollice Espy, MD [facility]  If 7PM-7AM, please contact night-coverage [email or web address] Password [facility] 01/16/2018, 10:31 AM

## 2018-01-16 NOTE — Progress Notes (Signed)
[facility] Progress Note      Assessment/Plan Principal Problem: Pulmonary embolus (HCC) Active Problems: Hypoxia AKI (acute kidney injury) (HCC) Hypotension Pulmonary embolism (HCC)  SBO - no hx of abdominal surgeries. - bowel function, eating ice chips - repeat 06/10 still shows significantly dilated small bowel, no real change.  - hold off on TPN  FEN: pull NGT, clears, breeze VTE: Xareltoon hold/heparin drip ID: Zosyn 06/04-06/10 Foley: none Follow up:TBD  Plan: NGT, clears, breeze, ambulate    LOS: 7 days    Subjective: CC: ileus  Pt continues to have flatus. Eating ice chips with his hard candy. Very minimal abdominal pain. Pt states at baseline a very sedentary life style 2/2 stroke. No issues overnight.    Objective: Vital signs in last 24 hours: Temp:  [98.1 F (36.7 C)-98.6 F (37 C)] 98.1 F (36.7 C) (06/11 0850) Pulse Rate:  [82-93] 93 (06/11 0850) Resp:  [16-22] 22 (06/11 0850) BP: (127-130)/(92-97) 130/93 (06/11 0850) SpO2:  [92 %-96 %] 92 % (06/11 0850) Weight:  [81.8 kg (180 lb 5.4 oz)] 81.8 kg (180 lb 5.4 oz) (06/10 1637) Last BM Date: 01/16/18  Intake/Output from previous day: 06/10 0701 - 06/11 0700 In: 2086.4 [P.O.:60; I.V.:1426.4; IV Piggyback:600] Out: 2150 [Urine:1450; Emesis/NG output:700] Intake/Output this shift: Total I/O In: 332.5 [I.V.:332.5] Out: -   PE: Gen: Alert, NAD, pleasant, cooperative, non toxic appearing HEENT: NGT in place Pulm:Rate andeffort normal Abd: Soft,no distention,+BS, very mild generalized TTP without guarding and no signs of peritonitis Skin: no rashes noted, warm and dry   Anti-infectives: Anti-infectives (From admission, onward)   Start     Dose/Rate Route Frequency Ordered Stop   01/09/18 0800  piperacillin-tazobactam (ZOSYN) IVPB 3.375 g  Status:  Discontinued     3.375 g 12.5 mL/hr over 240 Minutes Intravenous Every 8 hours 01/09/18 0146  01/15/18 0934   01/09/18 0145  piperacillin-tazobactam (ZOSYN) IVPB 3.375 g     3.375 g 100 mL/hr over 30 Minutes Intravenous  Once 01/09/18 0135 01/09/18 0303      Lab Results:  Recent Labs    01/15/18 0540 01/15/18 1813 01/16/18 0556  WBC 9.8  --  8.6  HGB 13.8 14.9 13.5  HCT 42.4 45.1 41.4  PLT 231  --  284   BMET Recent Labs    01/15/18 0540 01/16/18 0556  NA 140 143  K 3.0* 3.6  CL 105 107  CO2 26 28  GLUCOSE 115* 114*  BUN 9 6  CREATININE 0.79 0.91  CALCIUM 8.0* 8.4*   PT/INR No results for input(s): LABPROT, INR in the last 72 hours. CMP     Component Value Date/Time   NA 143 01/16/2018 0556   K 3.6 01/16/2018 0556   CL 107 01/16/2018 0556   CO2 28 01/16/2018 0556   GLUCOSE 114 (H) 01/16/2018 0556   BUN 6 01/16/2018 0556   CREATININE 0.91 01/16/2018 0556   CALCIUM 8.4 (L) 01/16/2018 0556   PROT 5.9 (L) 01/16/2018 0556   ALBUMIN 2.4 (L) 01/16/2018 0556   AST 22 01/16/2018 0556   ALT 32 01/16/2018 0556   ALKPHOS 284 (H) 01/16/2018 0556   BILITOT 1.4 (H) 01/16/2018 0556   GFRNONAA >60 01/16/2018 0556   GFRAA >60 01/16/2018 0556   Lipase     Component Value Date/Time   LIPASE 38 01/08/2018 2205    Studies/Results: Dg Abd Portable 1v  Result Date: 01/15/2018 CLINICAL DATA:  Small bowel obstruction. EXAM: PORTABLE ABDOMEN - 1 VIEW  COMPARISON:  01/14/2018 FINDINGS: There is a nasogastric tube which is coiled in the left upper quadrant of the abdomen with tip projecting over the body of stomach. Gaseous distension of the large and small bowel loops are again noted. Not significantly improved from previous exam. No air-fluid levels identified. IMPRESSION: 1. No change in the appearance of small bowel obstruction pattern. Electronically Signed   By: Signa Kell M.D.   On: 01/15/2018 09:03   [place] Ekg Site Rite  Result Date: 01/15/2018 If Site Rite image not attached, placement could not be confirmed due to current cardiac rhythm.     Jerre Simon , [facility] Surgery 01/16/2018, 10:08 AM  Pager: [phone] Mon-Wed, Friday 7:00am-4:30pm Thurs 7am-11:30am  Consults: [phone]

## 2018-01-16 NOTE — Progress Notes (Signed)
Physical Therapy Treatment Patient Details Name: Jason Myers MRN: [id number] DOB: 03/14/1951 Today's Date: 01/16/2018    History of Present Illness 67 y.o. male with medical history significant of hemorrhagic stroke s/p ruptured aneurysm in 1984, history of DVT/PE, HLD, and BPH; who presents with complaints of generalized abdominal pain was found to have enteritis , was started on zosyn. He was also found to have acute PE/ DVT AND was started on IV heparin. abd x ray showing  high grade SBO. NG tube placed and surgery consulted. NG tube removed 6/11    PT Comments    Pt making slow progress towards his goals, limited in his safe mobility today by oxygen desaturation (see General Comments), incontinence of stool with mobility and increased fatigue. Pt currently requires mod A for bed mobility, transfers and ambulation of 2 bouts of 10 feet with UE support on IV pole and then on sink and door knob. Pt educated on need to utilize stable UE support for safety. PT will bring cane to next session.   Follow Up Recommendations  CIR     Equipment Recommendations  Other (comment)(TBD)    Recommendations for Other Services       Precautions / Restrictions Precautions Precautions: Fall Restrictions Weight Bearing Restrictions: No    Mobility  Bed Mobility Overal bed mobility: Needs Assistance Bed Mobility: Supine to Sit     Supine to sit: Mod assist Sit to supine: Mod assist   General bed mobility comments: modA for trunk to upright;modA for LE back into bed  Transfers Overall transfer level: Needs assistance Equipment used: 1 person hand held assist Transfers: Sit to/from Stand Sit to Stand: Mod assist         General transfer comment: modA for powerup and steadying  Ambulation/Gait Ambulation/Gait assistance: +2 safety/equipment;Mod assist Ambulation Distance (Feet): 20 Feet(2 x 10 to and from bathroom) Assistive device: 1 person hand held assist;IV Pole Gait  Pattern/deviations: Decreased dorsiflexion - left;Decreased step length - left;Shuffle;Drifts right/left Gait velocity: slow Gait velocity interpretation: <1.31 ft/sec, indicative of household ambulator General Gait Details: modA for ambulating to bathroom, pt very independent not wanting assist however makes poor choices for surfaces to stabilize on, did best with R UE support on IV pole       Balance Overall balance assessment: Needs assistance Sitting-balance support: No upper extremity supported;Feet supported Sitting balance-Leahy Scale: Fair     Standing balance support: Single extremity supported Standing balance-Leahy Scale: Poor Standing balance comment: requires modA for standing                             Cognition Arousal/Alertness: Awake/alert Behavior During Therapy: WFL for tasks assessed/performed Overall Cognitive Status: Within Functional Limits for tasks assessed                                 General Comments: pt continues to be anxious about loss of home and belongings with SNF stay         General Comments General comments (skin integrity, edema, etc.): on entry nasal cannula off and pt Sao2 86%O2, replaced 3L O2 via nasal cannula and SaO2 rebounded to 96%O2, Pt became incontinent of stool on way to bathroom and required max A for cleaning and bathing before returning to bed. Pt educated on increased assistance needed and need for additional rehab before returning home  Pertinent Vitals/Pain Pain Assessment: Faces Faces Pain Scale: Hurts little more Pain Location: abdomen  Pain Descriptors / Indicators: Tender Pain Intervention(s): Limited activity within patient's tolerance;Monitored during session    Home Living Family/patient expects to be discharged to:: Private residence Living Arrangements: Alone Available Help at Discharge: Available PRN/intermittently;Friend(s) Type of Home: Apartment(Public Housing ) Home  Access: Elevator   Home Layout: One level Home Equipment: Cane - single point;Grab bars - tub/shower Additional Comments: rarely used Chiropodistcane;regular bed;brother lives close by, but does not provide much assistance    Prior Function Level of Independence: Needs assistance  Gait / Transfers Assistance Needed: limited community ambulation ADL's / Homemaking Assistance Needed: independent with ADLS, has aide 3x/wk to assist with iADLs;participates in senior nutrition program for meals 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, 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 : Unable Difficulty sitting down on and standing up from a chair with arms (e.g., wheelchair, bedside commode, etc,.)?: Unable Help needed moving to and from a bed to chair (including a wheelchair)?: A Lot Help needed walking in hospital room?: A Lot Help needed climbing 3-5 steps with a railing? : Total 6 Click Score: 10    End of Session Equipment Utilized During Treatment: Gait belt;Oxygen Activity Tolerance: Patient limited by fatigue Patient left: in bed;with call bell/phone within reach;with nursing/sitter in room Nurse Communication: Mobility status PT Visit Diagnosis: Unsteadiness on feet (R26.81);Other abnormalities of gait and mobility (R26.89);Muscle weakness (generalized) (M62.81);Difficulty in walking, not elsewhere classified (R26.2);Other symptoms and signs  involving the nervous system (R29.898);History of falling (Z91.81);Hemiplegia and hemiparesis Hemiplegia - Right/Left: Left Hemiplegia - dominant/non-dominant: Non-dominant Hemiplegia - caused by: Unspecified     Time: [id number] PT Time Calculation (min) (ACUTE ONLY): 30 min  Charges:  $Gait Training: 8-22 mins $Therapeutic Activity: 8-22 mins                    G Codes:       Mertice Uffelman B. Beverely RisenVan Fleet PT, DPT Acute Rehabilitation  [phone] Pager [phone]     [name] 01/16/2018, 5:09 PM

## 2018-01-16 NOTE — Plan of Care (Signed)
Discussed with patient plan of care, pain management and why he still can't not eat at this time with some teach back displayed.  Patient would rather start on clear liquids than TPN.

## 2018-01-17 DIAGNOSIS — I959 Hypotension, unspecified: Secondary | ICD-10-CM

## 2018-01-17 DIAGNOSIS — I5033 Acute on chronic diastolic (congestive) heart failure: Secondary | ICD-10-CM

## 2018-01-17 LAB — HEMOGLOBIN AND HEMATOCRIT, BLOOD
HCT: 40.8 % (ref 39.0–52.0)
HEMATOCRIT: 42.8 % (ref 39.0–52.0)
HEMOGLOBIN: 13.4 g/dL (ref 13.0–17.0)
Hemoglobin: 14.2 g/dL (ref 13.0–17.0)

## 2018-01-17 MED ORDER — FUROSEMIDE 10 MG/ML IJ SOLN
20.0000 mg | Freq: Once | INTRAMUSCULAR | Status: AC
  Administered 2018-01-17: 20 mg via INTRAVENOUS
  Filled 2018-01-17: qty 2

## 2018-01-17 MED ORDER — FUROSEMIDE 10 MG/ML IJ SOLN
20.0000 mg | Freq: Two times a day (BID) | INTRAMUSCULAR | Status: DC
  Administered 2018-01-18: 20 mg via INTRAVENOUS
  Filled 2018-01-17: qty 2

## 2018-01-17 MED ORDER — RIVAROXABAN 20 MG PO TABS: 20.0000 mg | ORAL_TABLET | Freq: Every day | ORAL | Status: DC

## 2018-01-17 MED ORDER — RIVAROXABAN 15 MG PO TABS
15.0000 mg | ORAL_TABLET | Freq: Two times a day (BID) | ORAL | Status: DC
  Administered 2018-01-17 – 2018-01-18 (×3): 15 mg via ORAL
  Filled 2018-01-17 (×3): qty 1

## 2018-01-17 NOTE — Progress Notes (Signed)
   Subjective/Chief Complaint: Tolerated liquids, several BMs   Objective: Vital signs in last 24 hours: Temp:  [98.1 F (36.7 C)-98.9 F (37.2 C)] 98.1 F (36.7 C) (06/12 0807) Pulse Rate:  [85-106] 106 (06/12 0807) Resp:  [18-22] 18 (06/11 2343) BP: (104-130)/(70-98) 127/98 (06/12 0807) SpO2:  [90 %-95 %] 90 % (06/12 0807) Weight:  [81.3 kg (179 lb 3.7 oz)] 81.3 kg (179 lb 3.7 oz) (06/12 0550) Last BM Date: 01/16/18  Intake/Output from previous day: 06/11 0701 - 06/12 0700 In: 1275 [P.O.:695; I.V.:580] Out: 725 [Urine:725] Intake/Output this shift: No intake/output data recorded.  General appearance: alert and cooperative Resp: clear to auscultation bilaterally Cardio: regular rate and rhythm GI: soft, NT, much less distended  Lab Results:  Recent Labs    01/15/18 0540  01/16/18 0556 01/16/18 1907 01/17/18 0625  WBC 9.8  --  8.6  --   --   HGB 13.8   < > 13.5 14.3 13.4  HCT 42.4   < > 41.4 43.3 40.8  PLT 231  --  284  --   --    < > = values in this interval not displayed.   BMET Recent Labs    01/15/18 0540 01/16/18 0556  NA 140 143  K 3.0* 3.6  CL 105 107  CO2 26 28  GLUCOSE 115* 114*  BUN 9 6  CREATININE 0.79 0.91  CALCIUM 8.0* 8.4*   PT/INR No results for input(s): LABPROT, INR in the last 72 hours. ABG No results for input(s): PHART, HCO3 in the last 72 hours.  Invalid input(s): PCO2, PO2  Studies/Results: [place] Ekg Site Rite  Result Date: 01/15/2018 If Site Rite image not attached, placement could not be confirmed due to current cardiac rhythm.   Anti-infectives: Anti-infectives (From admission, onward)   Start     Dose/Rate Route Frequency Ordered Stop   01/09/18 0800  piperacillin-tazobactam (ZOSYN) IVPB 3.375 g  Status:  Discontinued     3.375 g 12.5 mL/hr over 240 Minutes Intravenous Every 8 hours 01/09/18 0146 01/15/18 0934   01/09/18 0145  piperacillin-tazobactam (ZOSYN) IVPB 3.375 g     3.375 g 100 mL/hr over 30 Minutes  Intravenous  Once 01/09/18 0135 01/09/18 0303      Assessment/Plan: SBO/ileus much better - reg diet Please re-call us PRN  LOS: 8 days    Liz MaladyBurke E Yarden Hillis 01/17/2018

## 2018-01-17 NOTE — Progress Notes (Addendum)
4:30pm Informed by [facility] coordinator that patient concerned about potential cost of CIR so might be better suited for SNF.  CSW called pt to inquire and he is agreeable to SNF as he could not afford the potential costs of any copays and patient's SNF stay should be covered 100% by Medicare plan for up to 20 days.    CSW initated [facility] and will follow up with patient tomorrow for facility choice  12:30pm CSW met with patient and informed that CSW had spoken with his public housing Radio broadcast assistant and confirmed that his housing was not in jeopardy since he was absent for medical concerns.  CSW informed that I had sent a letter to public housing informing them of his admission and likelihood of needing anywhere from 7-30 days at rehab at discharge.  They confirm this will be fine and will not jeopardize his housing but patient should keep them informed especially if anticipated absence will be more than 30 days.  Patient is agreeable to rehab- CSW provided pt with list of SNF offers but pt prefers CIR if possible  CSW will continue to follow  Jorge Ny, [place] Social Worker [phone]

## 2018-01-17 NOTE — Discharge Instructions (Signed)

## 2018-01-17 NOTE — Progress Notes (Signed)
Inpatient Rehabilitation-Admissions Coordinator   [facility] regarding [facility] and possible co-pay. AC unable to guarantee pt that his co-pay would be 100% covered. Pt stated multiple times that he could not pay that co-pay. Pt also hesitant about the intensity portion of inpatient rehab and preferred a less intensive setting. AC communicated ultimate decision to CSW. Pt in agreement with SNF placement after discussion.   Nanine MeansKelly Alejos Reinhardt, OTR/L  Rehab Admissions Coordinator  [phone] 01/17/2018 5:05 PM

## 2018-01-17 NOTE — Progress Notes (Addendum)
PROGRESS NOTE    Zigmund DanielFrank F Styron  ZOX:[id number] DOB: May 03, 1951 DOA: 01/08/2018 PCP: Gean BirchwoodPa, [facility]    Brief Narrative:  [name] a 67 y.o.malewith medical history significant ofhemorrhagic strokes/pruptured aneurysm in 1984, history of DVT/PE, HLD, and BPH;who presents with complaints of generalized abdominal pain was found to have enteritis , was started on zosyn. He was also found to have acute PE/ DVT & was started on IV heparin.  He was later on transitioned to oral xarelto for continued anticoagulation.  On 6/6, pt became nauseated, started vomiting black emesis, which was postive for blood.  GI consulted.  Findings noted minimal drop in hemoglobin and no signs of overt bleeding.  It was felt that he was not having active GI bleed and can be monitored for the time being.  Patient's abdominal x-ray noted high-grade small bowel obstruction, so surgery consulted as well.  Patient made n.p.o., NG tube placed.  Started on IV heparin.  Since then, hemoglobin has remained stable.  No further episodes of hematemesis.  After several days, patient started passing gas.  NG tube clamped 6/10 & removed 6/11.  Patient has been tolerating clears.  Seen by surgery 6/12 and small bowel obstruction felt to be completely resolved.  Patient advance to regular diet.    Patient noted to have some congestion, mild cough with inspiration.  Started on inspirometer.  Has a history of diastolic heart failure, so BNP checked and found to be elevated at 2200 on 6/11.  IV fluids stopped and patient started on IV Lasix which she has had good diuresis since.  Patient accepted for inpatient rehab and currently awaiting insurance approval.  He has no complaints other than some mild congestion.  He does not feel short of breath.     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  Hemoglobin has since remained stable on IV heparin.  Since he is able to take p.o. now, Xarelto started 6/12 and heparin stopped.  Ileitis: Completed course of antibiotics.  Resolved.  Patient tolerating p.o.  History of CVA with secondary left-sided hemiplegia: He was on Pradaxa in the past, but has not been taking this.  Now will start Xarelto as above  Acute on chronic diastolic heart failure: Incidentally noted on echocardiogram.  Patient appears euvolemic.  Check BNP and found to be elevated at 2250.  Have stopped IV fluids and started IV Lasix.  Monitoring strict input/output and daily weights.  Small bowel obstruction: Resolved.  Status post NG tube placement and resolution by 6/11.  Surgery formally signed off.  Deconditioning: Accepted for inpatient rehab.  Insurance approval pending.  Hypokalemia: replaced.   AKI:  Improved with hydration.  Creatinine at baseline.   Hyperlipidemia:  Resume crestor.   BPH:  On flomax.   DVT prophylaxis: Xarelto Code Status:  Full code.  Family Communication: none at bedside.  Disposition Plan: Transfer to inpatient rehab bed once bed approved   Consultants:   Gastroenterology  Surgery.     Procedures: Echocardiogram done 6/5: Grade 1 diastolic dysfunction noted   Antimicrobials:  IV Zosyn 6/4-6/10     Objective: Vitals:   01/16/18 1632 01/16/18 2343 01/17/18 0550 01/17/18 0807  BP: 107/75 104/70  (!) 127/98  Pulse: (!) 101 85  (!) 106  Resp:  18    Temp: 98.3 F (36.8 C) 98.9 F (37.2 C)  98.1 F (36.7 C)  TempSrc: Oral Oral  Oral  SpO2: 94% 95%  90%  Weight:   81.3 kg (179 lb 3.7 oz)   Height:        Intake/Output Summary (Last 24 hours) at  01/17/2018 1142 Last data filed at 01/17/2018 0940 Gross per 24 hour  Intake 942.5 ml  Output 1325 ml  Net -382.5 ml   Filed Weights   01/08/18 2345 01/15/18 1637 01/17/18 0550  Weight: 83.5 kg (184 lb) 81.8 kg (180 lb 5.4 oz) 81.3 kg (179 lb 3.7 oz)    Examination:  General exam: Alert and oriented x3, no acute distress HEENT: Normocephalic, atraumatic, mucous membranes moist Neck: Supple, no JVD Respiratory system: Clear to auscultation bilaterally Cardiovascular system: Regular rate and rhythm, S1-S2 Gastrointestinal system: Abdomen is soft , nondistended, no tenderness, positive bowel sounds Neuro: Left-sided upper extremity hemiparesis Extremities: No clubbing or cyanosis, trace pitting edema. Left upper ext hemiparesis.  Skin: No skin breaks tears or lesions Psychiatry: Appropriate, no evidence of psychoses    Data Reviewed: I have personally reviewed following labs and imaging studies  CBC: Recent Labs  Lab 01/12/18 0546  01/13/18 0515  01/14/18 0210  01/15/18 0540 01/15/18 1813 01/16/18 0556 01/16/18 1907 01/17/18 0625  WBC 9.2  --  10.0  --  9.1  --  9.8  --  8.6  --   --   NEUTROABS  --   --   --   --   --   --   --   --  5.5  --   --   HGB 14.5   < > 13.7   < > 13.1   < > 13.8 14.9 13.5 14.3 13.4  HCT 43.0   < > 41.7   < > 39.8   < > 42.4 45.1 41.4 43.3 40.8  MCV 88.3  --  89.1  --  88.4  --  89.3  --  89.6  --   --   PLT 214  --  204  --  216  --  231  --  284  --   --    < > = values in this interval not displayed.   Basic Metabolic Panel: Recent Labs  Lab 01/11/18 0944 01/12/18 0546 01/14/18 0210 01/14/18 1023 01/15/18 0540 01/16/18 0556  NA 138 132* 141  --  140 143  K 3.5 4.5 3.1*  --  3.0* 3.6  CL 101 94* 105  --  105 107  CO2 27 25 27   --  26 28  GLUCOSE 137* 167* 110*  --  115* 114*  BUN 23* 11 10  --  9 6  CREATININE 0.90 1.05 0.78  --  0.79 0.91  CALCIUM 8.8* 9.8 7.8*  --  8.0* 8.4*  MG  --   --   --  2.0  --  2.1  PHOS  --   --    --   --   --  2.2*   GFR: Estimated Creatinine Clearance: 87.8 mL/min (by C-G formula based on SCr of 0.91 mg/dL). Liver Function Tests: Recent Labs  Lab 01/11/18 0944 01/16/18 0556  AST 19 22  ALT  16* 32  ALKPHOS 54 284*  BILITOT 1.0 1.4*  PROT 6.3* 5.9*  ALBUMIN 2.9* 2.4*   No results for input(s): LIPASE, AMYLASE in the last 168 hours. No results for input(s): AMMONIA in the last 168 hours. Coagulation Profile: No results for input(s): INR, PROTIME in the last 168 hours. Cardiac Enzymes: No results for input(s): CKTOTAL, CKMB, CKMBINDEX, TROPONINI in the last 168 hours. BNP (last 3 results) No results for input(s): PROBNP in the last 8760 hours. HbA1C: No results for input(s): HGBA1C in the last 72 hours. CBG: Recent Labs  Lab 01/12/18 1153 01/15/18 2238 01/16/18 0623 01/16/18 1214 01/16/18 1833  GLUCAP 84 111* 101* 128* 158*   Lipid Profile: Recent Labs    01/16/18 0556  TRIG 176*   Thyroid Function Tests: No results for input(s): TSH, T4TOTAL, FREET4, T3FREE, THYROIDAB in the last 72 hours. Anemia Panel: No results for input(s): VITAMINB12, FOLATE, FERRITIN, TIBC, IRON, RETICCTPCT in the last 72 hours. Sepsis Labs: Recent Labs  Lab 01/11/18 0944  LATICACIDVEN 1.4    Recent Results (from the past 240 hour(s))  Blood Culture (routine x 2)     Status: None   Collection Time: 01/09/18  2:15 AM  Result Value Ref Range Status   Specimen Description BLOOD RIGHT HAND  Final   Special Requests   Final    BOTTLES DRAWN AEROBIC AND ANAEROBIC Blood Culture results may not be optimal due to an inadequate volume of blood received in culture bottles   Culture   Final    NO GROWTH 5 DAYS Performed at [facility], [address] [place], [place] [address]    Report Status 01/14/2018 FINAL  Final  Blood Culture (routine x 2)     Status: None   Collection Time: 01/09/18  2:20 AM  Result Value Ref Range Status   Specimen Description BLOOD RIGHT FOREARM   Final   Special Requests   Final    BOTTLES DRAWN AEROBIC AND ANAEROBIC Blood Culture adequate volume   Culture   Final    NO GROWTH 5 DAYS Performed at [facility], [address] [place], [place] [address]    Report Status 01/14/2018 FINAL  Final         Radiology Studies: No results found.      Scheduled Meds: . feeding supplement  1 Container Oral TID BM  . furosemide  20 mg Intravenous Q12H  . rivaroxaban  15 mg Oral BID WC   Followed by  . [START ON 02/07/2018] rivaroxaban  20 mg Oral Q supper  . rosuvastatin  10 mg Oral Daily  . tamsulosin  0.4 mg Oral QHS   Continuous Infusions:    LOS: 8 days     Hollice Espy, MD [facility]  If 7PM-7AM, please contact night-coverage [email or web address] Password [facility] 01/17/2018, 11:42 AM

## 2018-01-17 NOTE — Plan of Care (Signed)
  Problem: Nutrition: Goal: Adequate nutrition will be maintained Outcome: Progressing  Pt had his NG tube removed earlier during the day. Pt tolerating clear liquid diet at this time with no complaints of nausea/vomiting or abdominal distention or pain/discomfort. Pt has a good appetite.   Problem: Elimination: Goal: Will not experience complications related to bowel motility Outcome: Progressing  Pts bowels have moved multiple times today since starting a clear liquid diet and removal of NG tube, however his stools have been loose.

## 2018-01-17 NOTE — Progress Notes (Signed)
Inpatient Rehabilitation-Admissions Coordinator    Met with patient at the bedside to discuss team's recommendation for inpatient rehabilitation. Shared booklets, expectations while in CIR, expected length of stay, and anticipated functional level at DC. AC will need to confirm support at home and get definitive answer from pt as to what rehab venue he would like (pt perserverating on housing situation and fearful of losing his apartment due to extended stay in [place]) with pt having difficulty understanding need for support and rehab; [facility] spoke with SW regarding his housing situation, who was aware and had already spoken to his housing unit. Plan to follow for timing of medical readiness, pt decision, insurance authorization, and IP Rehab bed availability. Call if questions.   Jason Myers, OTR/L  Rehab Admissions Coordinator  [phone] 01/17/2018 11:06 AM

## 2018-01-17 NOTE — Progress Notes (Signed)
ANTICOAGULATION CONSULT NOTE - Follow-Up  Pharmacy Consult for Heparin Indication: pulmonary embolus  No Known Allergies  Patient Measurements: Height: 6\' 1"  (185.4 cm) Weight: 179 lb 3.7 oz (81.3 kg) IBW/kg (Calculated) : 79.9  Vital Signs: Temp: 98.1 F (36.7 C) (06/12 0807) Temp Source: Oral (06/12 0807) BP: 127/98 (06/12 0807) Pulse Rate: 106 (06/12 0807)  Labs: Recent Labs    01/15/18 0455 01/15/18 0540  01/16/18 0544 01/16/18 0556 01/16/18 1907 01/17/18 0625  HGB  --  13.8   < >  --  13.5 14.3 13.4  HCT  --  42.4   < >  --  41.4 43.3 40.8  PLT  --  231  --   --  284  --   --   HEPARINUNFRC 0.43  --   --  0.43  --   --   --   CREATININE  --  0.79  --   --  0.91  --   --    < > = values in this interval not displayed.    Estimated Creatinine Clearance: 87.8 mL/min (by C-G formula based on SCr of 0.91 mg/dL).   Medical History: Past Medical History:  Diagnosis Date  . BPH (benign prostatic hyperplasia)   . Chronic diastolic CHF (congestive heart failure) (HCC) 01/16/2018  . CVA (cerebral vascular accident) (HCC) 1984   left side affected.    . Hypercholesterolemia     Assessment: 67yo male c/o abdominal pain, CT reveals PE (possibly subacute) to begin heparin; of note, pt had PE in April 2018 tx'd w/ Pradaxa, which has since been d/c'd. Transitioned to Xarelto 6/5, last dose 6/5, then had new SBO so transitioned back to heparin. To restart Xarelto 6/12.   Goal of Therapy:  Heparin level 0.3-0.7 units/ml Monitor platelets by anticoagulation protocol: Yes    Plan:  Stop heparin gtt Start Xarelto 15mg  PO BID x 21 days, then start Xarelto 20mg  PO daily  Thank you for allowing pharmacy to be a part of this patient's care.  Enzo BiNathan Teresha Hanks, PharmD, BCPS Clinical Pharmacist Phone number [phone] 01/17/2018 9:50 AM

## 2018-01-18 DIAGNOSIS — E876 Hypokalemia: Secondary | ICD-10-CM

## 2018-01-18 LAB — BASIC METABOLIC PANEL
Anion gap: 11 (ref 5–15)
BUN: 15 mg/dL (ref 6–20)
CALCIUM: 8.6 mg/dL — AB (ref 8.9–10.3)
CO2: 27 mmol/L (ref 22–32)
CREATININE: 0.78 mg/dL (ref 0.61–1.24)
Chloride: 103 mmol/L (ref 101–111)
GFR calc Af Amer: >60 mL/min (ref >60)
Glucose, Bld: 101 mg/dL — ABNORMAL HIGH (ref 65–99)
Potassium: 3.3 mmol/L — ABNORMAL LOW (ref 3.5–5.1)
Sodium: 141 mmol/L (ref 135–145)

## 2018-01-18 LAB — HEMOGLOBIN AND HEMATOCRIT, BLOOD
HEMATOCRIT: 43.2 % (ref 39.0–52.0)
Hemoglobin: 14.5 g/dL (ref 13.0–17.0)

## 2018-01-18 MED ORDER — POTASSIUM CHLORIDE CRYS ER 20 MEQ PO TBCR
40.0000 meq | EXTENDED_RELEASE_TABLET | Freq: Every day | ORAL | Status: DC
Start: 2018-01-18 — End: 2018-01-18
  Administered 2018-01-18: 40 meq via ORAL
  Filled 2018-01-18: qty 2

## 2018-01-18 MED ORDER — RIVAROXABAN (XARELTO) VTE STARTER PACK (15 & 20 MG): ORAL_TABLET | ORAL | 0 refills | Status: DC

## 2018-01-18 NOTE — Progress Notes (Signed)
Physical Therapy Treatment Patient Details Name: Jason Myers MRN: [id number] DOB: Feb 09, 1951 Today's Date: 01/18/2018    History of Present Illness 66 y.o. male with medical history significant of hemorrhagic stroke s/p ruptured aneurysm in 1984, history of DVT/PE, HLD, and BPH; who presents with complaints of generalized abdominal pain was found to have enteritis , was started on zosyn. He was also found to have acute PE/ DVT AND was started on IV heparin. abd x ray showing  high grade SBO. NG tube placed and surgery consulted.    PT Comments    Pt continues to slowly make progress towards his goals, however continues to be limited in safe mobility by impulsivity and decreased safety awareness, as well as decreased strength and balance. Pt currently requires min A for bed mobility, modA for transfers and modAx2 for ambulation for total of 30 feet with use of SPC. Pt continues to have incontinence of stool which makes him impulsive when ambulating to the bathroom. Pt educated on need to be safe first. Pt indicates that he does not think he can handle the increased activity required at [facility] and is requesting SNF level rehab at d/c. PT concurs.      Follow Up Recommendations  SNF     Equipment Recommendations  Other (comment)(TBD at next venue)    Recommendations for Other Services Rehab consult;OT consult     Precautions / Restrictions Precautions Precautions: Fall Restrictions Weight Bearing Restrictions: No    Mobility  Bed Mobility Overal bed mobility: Needs Assistance Bed Mobility: Supine to Sit;Sit to Supine     Supine to sit: Min assist     General bed mobility comments: min A for trunk to upright  Transfers Overall transfer level: Needs assistance Equipment used: Straight cane Transfers: Sit to/from Stand Sit to Stand: Mod assist         General transfer comment: modA for power up and steadying in standing, minA for slowing descent into  chair  Ambulation/Gait Ambulation/Gait assistance: Max assist;+2 physical assistance Gait Distance (Feet): 30 Feet Assistive device: Straight cane Gait Pattern/deviations: Decreased dorsiflexion - left;Decreased stance time - left;Step-to pattern;Shuffle Gait velocity: slow   General Gait Details: maxAx2 for initial steadying with gait to bathroom, vc for steadying with SPC, pt still impulsive with movement when needing to use bathroom, after using toilet pt requires modAx2 for steadying with gait with SPC, pt able to sequence much better          Balance Overall balance assessment: Needs assistance Sitting-balance support: No upper extremity supported;Feet supported Sitting balance-Leahy Scale: Fair     Standing balance support: Bilateral upper extremity supported Standing balance-Leahy Scale: Zero Standing balance comment: requires modA for standing                            Cognition Arousal/Alertness: Awake/alert Behavior During Therapy: WFL for tasks assessed/performed Overall Cognitive Status: Within Functional Limits for tasks assessed                                           General Comments General comments (skin integrity, edema, etc.): Pt with South  off upon entry, SaO2 90%O2, able to ambulate on RA while maintaining SaO2 in low 90%'s, after ambulation in sitting SaO2 on RA 96%. RN notified       Pertinent Vitals/Pain Pain Assessment: No/denies pain  PT Goals (current goals can now be found in the care plan section) Acute Rehab PT Goals Patient Stated Goal: feel better PT Goal Formulation: With patient Time For Goal Achievement: 01/29/18 Potential to Achieve Goals: Fair Progress towards PT goals: Progressing toward goals    Frequency    Min 3X/week      PT Plan Discharge plan needs to be updated       AM-PAC PT "6 Clicks" Daily Activity  Outcome Measure  Difficulty turning over in bed (including adjusting  bedclothes, sheets and blankets)?: A Little Difficulty moving from lying on back to sitting on the side of the bed? : Unable Difficulty sitting down on and standing up from a chair with arms (e.g., wheelchair, bedside commode, etc,.)?: Unable Help needed moving to and from a bed to chair (including a wheelchair)?: A Lot Help needed walking in hospital room?: A Little Help needed climbing 3-5 steps with a railing? : Total 6 Click Score: 11    End of Session Equipment Utilized During Treatment: Gait belt Activity Tolerance: Patient limited by fatigue Patient left: in chair;with call bell/phone within reach Nurse Communication: Mobility status PT Visit Diagnosis: Unsteadiness on feet (R26.81);Other abnormalities of gait and mobility (R26.89);Muscle weakness (generalized) (M62.81);Difficulty in walking, not elsewhere classified (R26.2);Other symptoms and signs involving the nervous system (R29.898);History of falling (Z91.81);Hemiplegia and hemiparesis Hemiplegia - Right/Left: Left Hemiplegia - dominant/non-dominant: Non-dominant Hemiplegia - caused by: Unspecified     Time: [id number] PT Time Calculation (min) (ACUTE ONLY): 26 min  Charges:  $Gait Training: 8-22 mins $Therapeutic Activity: 8-22 mins                    G Codes:       Jason Myers B. Beverely RisenVan Myers PT, DPT Acute Rehabilitation  [phone] Pager [phone]     Jason Myers 01/18/2018, 11:09 AM

## 2018-01-18 NOTE — Plan of Care (Signed)
  Problem: Health Behavior/Discharge Planning: Goal: Ability to manage health-related needs will improve Outcome: Progressing  Pt was updated on POC and possible discharge plans. Pt able to ask questions, and all questions were answered. Plan is to discharge pt to SNF tomorrow.   Problem: Elimination: Goal: Will not experience complications related to bowel motility Outcome: Adequate for Discharge  Pt able to tolerate regular diet and passing gas and having bowel movements. No abdominal distention/discomfort/pain reported.

## 2018-01-18 NOTE — Discharge Summary (Signed)
Discharge Summary  Jason Myers VFI:[id number] DOB: [birth year]  PCP: Gean Birchwood, [facility]  Admit date: 01/08/2018 Discharge date: 01/18/2018  Time spent: 25 minutes  Recommendations for Outpatient Follow-up:  1. New medication: Xarelto 15 mg p.o. twice daily x20 days.  Last dose on 7/2.  Then Xarelto 20 mg p.o. Daily. 2. Medication change: Stop Pradaxa.  (Patient had already stopped taking this medication sometime ago) 3. Being discharged to [facility] skilled nursing  Discharge Diagnoses:  Active Hospital Problems   Diagnosis Date Noted  . Acute on chronic diastolic CHF (congestive heart failure) (HCC) 01/16/2018  . Pulmonary embolus (HCC) 01/09/2018  . AKI (acute kidney injury) (HCC) 01/09/2018  . Hypotension 01/09/2018  . Pulmonary embolism (HCC) 01/09/2018  . HLD (hyperlipidemia) 06/08/2007    Resolved Hospital Problems  No resolved problems to display.    Discharge Condition: Improved, being discharged to [facility] skilled nursing facility  Diet recommendation: Heart healthy with boost breeze 3 times daily with meals  Vitals:   01/17/18 2338 01/18/18 0829  BP: 116/71 (!) 126/95  Pulse: 89 100  Resp: 20 17  Temp: 98.9 F (37.2 C) 97.9 F (36.6 C)  SpO2: 96% 94%    History of present illness:  Jason Myers a 67 y.o.malewith medical history significant ofhemorrhagic strokes/pruptured aneurysm in 1984, history of DVT/PE, HLD, and BPH;who presents with complaints of generalized abdominal painwas found to have enteritis , was started on zosyn. He was also found to have acute PE/ DVT & was started on IV heparin.    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  Hemoglobin has since remained stable on IV heparin.  Since he is able to take p.o. now, Xarelto started 6/12 and heparin stopped.  Hemoglobin has since remained stable and currently at 14.5  Ileitis: Completed course of antibiotics during hospitalization.  Resolved.  Patient tolerating p.o.  History of CVA with secondary left-sided hemiplegia: He was on Pradaxa in the past, but has not been taking this.  Now will start Xarelto as above  Acute on chronic diastolic heart failure: Incidentally noted on echocardiogram.  Patient appears euvolemic.  Check BNP and found to be elevated at 2250.  Have stopped IV fluids and started IV Lasix.    By 6/13, weight down to 178 pounds.  Small bowel obstruction: Resolved.  Status post NG tube placement and resolution by 6/11.  Surgery formally signed off.  Deconditioning: Accepted for skilled nursing after being evaluated by PT  Hypokalemia: replaced.   AKI:  Improved with hydration.  Creatinine at baseline.   Hyperlipidemia:  Resume crestor.   BPH:  On flomax.     Consultants:   Gastroenterology  Surgery.     Procedures: Echocardiogram done 6/5: Grade 1 diastolic dysfunction noted    Discharge Exam: BP (!) 126/95 (BP Location: Right Arm)   Pulse 100   Temp 97.9 F (36.6 C) (Oral)   Resp 17   Ht 6\' 1"  (1.854 m)   Wt 80.8 kg (178 lb 2.1 oz)   SpO2 94%   BMI 23.50 kg/m  General: Alert and oriented x3, no acute distress Cardiovascular: Regular rate and rhythm, S1-S2 Respiratory: Clear to auscultation bilaterally  Discharge Instructions You were cared for by a hospitalist during your hospital stay. If you have any questions about your discharge medications or the care you received while you were in the hospital after you are discharged, you can call the unit and asked to speak with the hospitalist on call if the hospitalist  that took care of you is not available. Once you are discharged, your primary care physician will handle any further medical issues. Please note that NO REFILLS for any discharge medications will be authorized once you are discharged, as it is imperative that you return to your primary care physician (or establish a relationship with a primary care physician if you do not have one) for your aftercare needs so that they can reassess your need for medications and monitor your lab values.  Discharge Instructions    Diet - low sodium heart healthy   Complete by:  As directed    Increase activity slowly   Complete by:  As directed      Allergies as of 01/18/2018   No Known Allergies     Medication List    STOP taking these medications   dabigatran 150 MG Caps capsule Commonly known as:  PRADAXA     TAKE these medications   Rivaroxaban 15 & 20 MG Tbpk Take as directed on package: Start with one 15mg  tablet by mouth twice a day with food. On Day 22, switch to one 20mg  tablet once a day with food.   rosuvastatin 10 MG tablet Commonly known as:  CRESTOR Take 10 mg by mouth daily.   tamsulosin 0.4 MG Caps capsule Commonly known as:  FLOMAX Take 0.4 mg by mouth at bedtime.      No Known Allergies Follow-up Information    [facility] Follow up in 1 month(s).   Specialty:  Internal Medicine Contact information: [address] [place] [address] [phone]            The results of significant diagnostics from this hospitalization (including imaging, microbiology, ancillary and laboratory) are listed below for reference.    Significant Diagnostic Studies: Ct Angio Chest Pe W And/or Wo Contrast  Result Date: 01/09/2018 CLINICAL DATA:  Acute onset of epigastric abdominal pain, radiating to the lower abdomen. Hypotension. Personal history of pulmonary embolus. EXAM: CT ANGIOGRAPHY CHEST CT ABDOMEN AND PELVIS WITH CONTRAST TECHNIQUE: Multidetector CT imaging of the  chest was performed using the standard protocol during bolus administration of intravenous contrast. Multiplanar CT image reconstructions and MIPs were obtained to evaluate the vascular anatomy. Multidetector CT imaging of the abdomen and pelvis was performed using the standard protocol during bolus administration of intravenous contrast. CONTRAST:  ISOVUE-370 IOPAMIDOL (ISOVUE-370) INJECTION 76% COMPARISON:  CTA of the chest performed 11/13/2016, and CT of the chest, abdomen and pelvis performed 11/10/2005 FINDINGS: CTA CHEST FINDINGS Cardiovascular: There is pulmonary embolus within pulmonary arteries to the right lower lobe; this may be subacute in nature. The RV/LV ratio is 0.8, within normal limits. The thoracic aorta is grossly unremarkable. The great vessels are within normal limits. The heart is normal in size. Mediastinum/Nodes: The mediastinum is grossly unremarkable. No mediastinal lymphadenopathy is seen. No pericardial effusion is identified. There is asymmetric enlargement of the right thyroid lobe. No axillary lymphadenopathy is seen. Lungs/Pleura: Mild bibasilar airspace opacities likely reflect atelectasis. No pleural effusion or pneumothorax is seen. No masses  are identified. Musculoskeletal: No acute osseous abnormalities are identified. The visualized musculature is unremarkable in appearance. Review of the MIP images confirms the above findings. CT ABDOMEN and PELVIS FINDINGS Hepatobiliary: The liver is unremarkable in appearance. The gallbladder is unremarkable in appearance. The common bile duct remains normal in caliber. Pancreas: The pancreas is within normal limits. Spleen: The spleen is unremarkable in appearance. Adrenals/Urinary Tract: The adrenal glands are unremarkable in appearance. Scattered bilateral renal cysts are noted, with a large 10.5 cm cyst at the upper pole of the left kidney. There is no evidence of hydronephrosis. No renal or ureteral stones are identified. No  perinephric stranding is seen. Stomach/Bowel: There is a slightly unusual appearance to the antrum of the stomach, without a definite ulceration. The stomach is otherwise grossly unremarkable. There is minimal haziness about ileal loops within the lower abdomen and pelvis, which could reflect a mild infectious or inflammatory process. Trace free fluid is noted tracking about small bowel loops, along the right paracolic gutter and within the pelvis. The appendix is not visualized; there is no evidence for appendicitis. Mild diverticulosis is noted along the sigmoid colon, without evidence of diverticulitis. Vascular/Lymphatic: Minimal calcification is noted at the distal abdominal aorta. The aorta is otherwise unremarkable. The inferior vena cava is grossly unremarkable. No retroperitoneal lymphadenopathy is seen. No pelvic sidewall lymphadenopathy is identified. Reproductive: The bladder is mildly distended and grossly unremarkable. The prostate is enlarged, measuring 5.4 cm in transverse dimension. Other: No additional soft tissue abnormalities are seen. Musculoskeletal: No acute osseous abnormalities are identified. The intervertebral disc space narrowing is noted at L3-L4. There is grade 1 anterolisthesis of L4 on L5, reflecting underlying facet disease. The visualized musculature is unremarkable in appearance. Review of the MIP images confirms the above findings. IMPRESSION: 1. Pulmonary embolus within pulmonary arteries to the right lower lobe; this may be subacute in nature. RV/LV ratio remains within normal limits. 2. Mild bibasilar airspace opacities likely reflect atelectasis. Lungs otherwise clear. 3. Minimal haziness about ileal loops within the lower abdomen and pelvis, which could reflect a mild infectious or inflammatory process. 4. Scattered bilateral renal cysts, with a large 10.5 cm cyst at the upper pole of the left kidney. 5. Enlarged prostate noted. Critical Value/emergent results were called by  telephone at the time of interpretation on 01/09/2018 at 12:48 am to [facility], who verbally acknowledged these results. Electronically Signed   By: Roanna Raider M.D.   On: 01/09/2018 00:50   Ct Abdomen Pelvis W Contrast  Result Date: 01/09/2018 CLINICAL DATA:  Acute onset of epigastric abdominal pain, radiating to the lower abdomen. Hypotension. Personal history of pulmonary embolus. EXAM: CT ANGIOGRAPHY CHEST CT ABDOMEN AND PELVIS WITH CONTRAST TECHNIQUE: Multidetector CT imaging of the chest was performed using the standard protocol during bolus administration of intravenous contrast. Multiplanar CT image reconstructions and MIPs were obtained to evaluate the vascular anatomy. Multidetector CT imaging of the abdomen and pelvis was performed using the standard protocol during bolus administration of intravenous contrast. CONTRAST:  ISOVUE-370 IOPAMIDOL (ISOVUE-370) INJECTION 76% COMPARISON:  CTA of the chest performed 11/13/2016, and CT of the chest, abdomen and pelvis performed 11/10/2005 FINDINGS: CTA CHEST FINDINGS Cardiovascular: There is pulmonary embolus within pulmonary arteries to the right lower lobe; this may be subacute in nature. The RV/LV ratio is 0.8, within normal limits. The thoracic aorta is grossly unremarkable. The great vessels are within normal limits. The heart is normal in size. Mediastinum/Nodes: The mediastinum is grossly unremarkable.  No mediastinal lymphadenopathy is seen. No pericardial effusion is identified. There is asymmetric enlargement of the right thyroid lobe. No axillary lymphadenopathy is seen. Lungs/Pleura: Mild bibasilar airspace opacities likely reflect atelectasis. No pleural effusion or pneumothorax is seen. No masses are identified. Musculoskeletal: No acute osseous abnormalities are identified. The visualized musculature is unremarkable in appearance. Review of the MIP images confirms the above findings. CT ABDOMEN and PELVIS FINDINGS  Hepatobiliary: The liver is unremarkable in appearance. The gallbladder is unremarkable in appearance. The common bile duct remains normal in caliber. Pancreas: The pancreas is within normal limits. Spleen: The spleen is unremarkable in appearance. Adrenals/Urinary Tract: The adrenal glands are unremarkable in appearance. Scattered bilateral renal cysts are noted, with a large 10.5 cm cyst at the upper pole of the left kidney. There is no evidence of hydronephrosis. No renal or ureteral stones are identified. No perinephric stranding is seen. Stomach/Bowel: There is a slightly unusual appearance to the antrum of the stomach, without a definite ulceration. The stomach is otherwise grossly unremarkable. There is minimal haziness about ileal loops within the lower abdomen and pelvis, which could reflect a mild infectious or inflammatory process. Trace free fluid is noted tracking about small bowel loops, along the right paracolic gutter and within the pelvis. The appendix is not visualized; there is no evidence for appendicitis. Mild diverticulosis is noted along the sigmoid colon, without evidence of diverticulitis. Vascular/Lymphatic: Minimal calcification is noted at the distal abdominal aorta. The aorta is otherwise unremarkable. The inferior vena cava is grossly unremarkable. No retroperitoneal lymphadenopathy is seen. No pelvic sidewall lymphadenopathy is identified. Reproductive: The bladder is mildly distended and grossly unremarkable. The prostate is enlarged, measuring 5.4 cm in transverse dimension. Other: No additional soft tissue abnormalities are seen. Musculoskeletal: No acute osseous abnormalities are identified. The intervertebral disc space narrowing is noted at L3-L4. There is grade 1 anterolisthesis of L4 on L5, reflecting underlying facet disease. The visualized musculature is unremarkable in appearance. Review of the MIP images confirms the above findings. IMPRESSION: 1. Pulmonary embolus within  pulmonary arteries to the right lower lobe; this may be subacute in nature. RV/LV ratio remains within normal limits. 2. Mild bibasilar airspace opacities likely reflect atelectasis. Lungs otherwise clear. 3. Minimal haziness about ileal loops within the lower abdomen and pelvis, which could reflect a mild infectious or inflammatory process. 4. Scattered bilateral renal cysts, with a large 10.5 cm cyst at the upper pole of the left kidney. 5. Enlarged prostate noted. Critical Value/emergent results were called by telephone at the time of interpretation on 01/09/2018 at 12:48 am to [facility], who verbally acknowledged these results. Electronically Signed   By: Roanna Raider M.D.   On: 01/09/2018 00:50   Dg Chest Portable 1 View  Result Date: 01/08/2018 CLINICAL DATA:  Epigastric abdominal pain.  Hypotension. EXAM: PORTABLE CHEST 1 VIEW COMPARISON:  Radiographs and CT 11/13/2016 FINDINGS: Low lung volumes with bibasilar atelectasis. Mild cardiomegaly likely accentuated by low lung volumes. No confluent airspace disease, pleural effusion or pneumothorax. No acute osseous abnormalities. IMPRESSION: Low lung volumes with bibasilar atelectasis. Electronically Signed   By: Rubye Oaks M.D.   On: 01/08/2018 23:39   Dg Abd Portable 1v  Result Date: 01/15/2018 CLINICAL DATA:  Small bowel obstruction. EXAM: PORTABLE ABDOMEN - 1 VIEW COMPARISON:  01/14/2018 FINDINGS: There is a nasogastric tube which is coiled in the left upper quadrant of the abdomen with tip projecting over the body of stomach. Gaseous distension of the large and  small bowel loops are again noted. Not significantly improved from previous exam. No air-fluid levels identified. IMPRESSION: 1. No change in the appearance of small bowel obstruction pattern. Electronically Signed   By: Signa Kell M.D.   On: 01/15/2018 09:03   Dg Abd Portable 1v  Result Date: 01/14/2018 CLINICAL DATA:  Abdominal pain. Follow-up dilated bowel loops.  Nasogastric tube placement. EXAM: PORTABLE ABDOMEN - 1 VIEW COMPARISON:  01/13/2018 FINDINGS: A nasogastric tube is seen with tip overlying the proximal stomach. Markedly dilated small bowel loops show no significant change. There is some gas within nondilated colon. These findings are suspicious for a distal small bowel obstruction. IMPRESSION: Findings suspicious for distal small bowel obstruction, without significant change. Nasogastric tube tip overlies the proximal stomach. Electronically Signed   By: Myles Rosenthal M.D.   On: 01/14/2018 11:35   Dg Abd Portable 1v  Result Date: 01/13/2018 CLINICAL DATA:  Small bowel obstruction EXAM: PORTABLE ABDOMEN - 1 VIEW COMPARISON:  January 12, 2018 FINDINGS: Generalized bowel dilatation remains. A small amount of air is noted in the rectum. No air-fluid levels or free air evident. IMPRESSION: Persistent generalized bowel dilatation similar to 1 day prior. Suspect bowel obstruction as most likely etiology. Severe ileus could present similarly. Electronically Signed   By: Bretta Bang III M.D.   On: 01/13/2018 13:58   Dg Abd Portable 1v  Result Date: 01/12/2018 CLINICAL DATA:  Small bowel obstruction EXAM: PORTABLE ABDOMEN - 1 VIEW COMPARISON:  01/11/2018 abdominal radiograph FINDINGS: Enteric tube terminates in proximal stomach. Marked dilatation of small bowel loops throughout the abdomen, not appreciably changed. Moderate colonic stool volume. No evidence of pneumatosis or pneumoperitoneum. No radiopaque nephrolithiasis. Moderate lumbar spondylosis. IMPRESSION: No appreciable change in marked dilatation of small bowel loops throughout the abdomen compatible with distal small bowel obstruction. Electronically Signed   By: Delbert Phenix M.D.   On: 01/12/2018 15:34   Dg Abd Portable 1v-small Bowel Obstruction Protocol-initial, 8 Hr Delay  Result Date: 01/11/2018 CLINICAL DATA:  Small bowel obstruction. EXAM: PORTABLE ABDOMEN - 1 VIEW COMPARISON:  Abdominal x-ray from  same day at 10:51 a.m. FINDINGS: Severely dilated loops of small bowel are unchanged. Partially visualized enteric tube and oral contrast within the stomach. Moderate stool in the right colon. No acute osseous abnormality. IMPRESSION: Unchanged severely dilated small bowel loops consistent with obstruction. Electronically Signed   By: Obie Dredge M.D.   On: 01/11/2018 23:53   Dg Abd Portable 1v-small Bowel Protocol-position Verification  Result Date: 01/11/2018 CLINICAL DATA:  Nasogastric tube placement. Small bowel obstruction. EXAM: PORTABLE ABDOMEN - 1 VIEW COMPARISON:  Study obtained earlier in the day FINDINGS: Nasogastric tube tip and side port are in the stomach. There remains generalized small bowel dilatation. No air-fluid levels. No free air evident. IMPRESSION: Nasogastric tube tip and side port in stomach. Bowel gas pattern indicative of a degree of bowel obstruction evident and essentially stable. No evident free air on this supine examination. Electronically Signed   By: Bretta Bang III M.D.   On: 01/11/2018 11:24   Dg Abd Portable 1v  Result Date: 01/11/2018 CLINICAL DATA:  Nausea/vomiting/Hematemesis ,abdomen distention EXAM: PORTABLE ABDOMEN - 1 VIEW COMPARISON:  CT of the abdomen and pelvis on 01/09/2018 FINDINGS: There is significant dilatation of small bowel loops throughout the abdomen, significantly increased compared to prior study. Large bowel loops are not dilated. No evidence for free intraperitoneal air on this supine view. Mild degenerative changes are seen in the lumbar spine. IMPRESSION:  High-grade small bowel obstruction. These results will be called to the ordering clinician or representative by the Radiologist Assistant, and communication documented in the PACS or zVision Dashboard. Electronically Signed   By: Norva PavlovElizabeth  Brown M.D.   On: 01/11/2018 08:00   Dg Abd Portable 2 Views  Result Date: 01/08/2018 CLINICAL DATA:  Epigastric abdominal pain.  Hypotension.  EXAM: PORTABLE ABDOMEN - 2 VIEW COMPARISON:  None. FINDINGS: No free intra-abdominal air. Prominent air-filled small bowel loops in the left upper quadrant measure up to 3.3 cm. Moderate stool in the ascending and proximal transverse colon, minimal stool distally. No abnormal rectal distention. No radiopaque calculi. Bibasilar atelectasis. Scoliotic curvature in degenerative change in the spine. IMPRESSION: Prominent air-filled left upper quadrant bowel loops can be seen with enteritis, ileus, or less likely early obstruction. No free air. Electronically Signed   By: Rubye OaksMelanie  Ehinger M.D.   On: 01/08/2018 23:38   [place] Ekg Site Rite  Result Date: 01/15/2018 If Site Rite image not attached, placement could not be confirmed due to current cardiac rhythm.   Microbiology: Recent Results (from the past 240 hour(s))  Blood Culture (routine x 2)     Status: None   Collection Time: 01/09/18  2:15 AM  Result Value Ref Range Status   Specimen Description BLOOD RIGHT HAND  Final   Special Requests   Final    BOTTLES DRAWN AEROBIC AND ANAEROBIC Blood Culture results may not be optimal due to an inadequate volume of blood received in culture bottles   Culture   Final    NO GROWTH 5 DAYS Performed at [facility], [address] [place], [place] [address]    Report Status 01/14/2018 FINAL  Final  Blood Culture (routine x 2)     Status: None   Collection Time: 01/09/18  2:20 AM  Result Value Ref Range Status   Specimen Description BLOOD RIGHT FOREARM  Final   Special Requests   Final    BOTTLES DRAWN AEROBIC AND ANAEROBIC Blood Culture adequate volume   Culture   Final    NO GROWTH 5 DAYS Performed at [facility], [address] [place], [place] [address]    Report Status 01/14/2018 FINAL  Final     Labs: Basic Metabolic Panel: Recent Labs  Lab 01/12/18 0546 01/14/18 0210 01/14/18 1023 01/15/18 0540 01/16/18 0556 01/18/18 0545  NA 132* 141  --  140 143 141  K 4.5 3.1*  --   3.0* 3.6 3.3*  CL 94* 105  --  105 107 103  CO2 25 27  --  26 28 27   GLUCOSE 167* 110*  --  115* 114* 101*  BUN 11 10  --  9 6 15   CREATININE 1.05 0.78  --  0.79 0.91 0.78  CALCIUM 9.8 7.8*  --  8.0* 8.4* 8.6*  MG  --   --  2.0  --  2.1  --   PHOS  --   --   --   --  2.2*  --    Liver Function Tests: Recent Labs  Lab 01/16/18 0556  AST 22  ALT 32  ALKPHOS 284*  BILITOT 1.4*  PROT 5.9*  ALBUMIN 2.4*   No results for input(s): LIPASE, AMYLASE in the last 168 hours. No results for input(s): AMMONIA in the last 168 hours. CBC: Recent Labs  Lab 01/12/18 0546  01/13/18 0515  01/14/18 0210  01/15/18 0540  01/16/18 [id number] 01/16/18 1907 01/17/18 0625 01/17/18 1740  01/18/18 0545  WBC 9.2  --  10.0  --  9.1  --  9.8  --  8.6  --   --   --   --   NEUTROABS  --   --   --   --   --   --   --   --  5.5  --   --   --   --   HGB 14.5   < > 13.7   < > 13.1   < > 13.8   < > 13.5 14.3 13.4 14.2 14.5  HCT 43.0   < > 41.7   < > 39.8   < > 42.4   < > 41.4 43.3 40.8 42.8 43.2  MCV 88.3  --  89.1  --  88.4  --  89.3  --  89.6  --   --   --   --   PLT 214  --  204  --  216  --  231  --  284  --   --   --   --    < > = values in this interval not displayed.   Cardiac Enzymes: No results for input(s): CKTOTAL, CKMB, CKMBINDEX, TROPONINI in the last 168 hours. BNP: BNP (last 3 results) Recent Labs    01/16/18 1051  BNP 2,245.1*    ProBNP (last 3 results) No results for input(s): PROBNP in the last 8760 hours.  CBG: Recent Labs  Lab 01/12/18 1153 01/15/18 2238 01/16/18 0623 01/16/18 1214 01/16/18 1833  GLUCAP 84 111* 101* 128* 158*       Signed:  Hollice Espy, MD [facility] 01/18/2018, 11:06 AM

## 2018-01-18 NOTE — Progress Notes (Signed)
[facility] at 1445 [facility]. All questions answered and 2W nurses station phone number left with nurse just in case she has additional questions.   Social workerBill RN

## 2018-01-18 NOTE — Clinical Social Work Placement (Signed)
   CLINICAL SOCIAL WORK PLACEMENT  NOTE  Date:  01/18/2018  Patient Details  Name: Jason Myers MRN: [id number] Date of Birth: [birth year]  Clinical Social Work is seeking post-discharge placement for this patient at the Skilled  Nursing Facility level of care (*CSW will initial, date and re-position this form in  chart as items are completed):  Yes   Patient/family provided with [facility] Clinical Social Work Department's list of facilities offering this level of care within the geographic area requested by the patient (or if unable, by the patient's family).  Yes   Patient/family informed of their freedom to choose among providers that offer the needed level of care, that participate in Medicare, Medicaid or managed care program needed by the patient, have an available bed and are willing to accept the patient.  Yes   Patient/family informed of [facility]'s ownership interest in [facility] and [facility], as well as of the fact that they are under no obligation to receive care at these facilities.  PASRR submitted to EDS on 01/16/18     PASRR number received on 01/16/18     Existing PASRR number confirmed on       FL2 transmitted to all facilities in geographic area requested by pt/family on 01/16/18     FL2 transmitted to all facilities within larger geographic area on       Patient informed that his/her managed care company has contracts with or will negotiate with certain facilities, including the following:        Yes   Patient/family informed of bed offers received.  [facility]     Physician recommends and patient chooses bed at      [facility]18/[facility].  Patient to be transferred to facility by ptar     Patient family notified on 01/18/18 of transfer.  Name of family member notified:        PHYSICIAN Please sign FL2     Additional Comment:     _______________________________________________ [name], Asiana Benninger H, LCSW 01/18/2018, 2:25 PM

## 2018-01-18 NOTE — Progress Notes (Addendum)
Patient received auth for SNF stay- first choice was [facility] but no beds 2nd choice is [facility] and the confirm bed for today  [facility] Anticipated discharge date: 6/13 Transportation by [facility]- called at 2:25pm  CSW signing off.  Burna SisJenna H. Jerri Glauser, LCSW Clinical Social Worker [phone]

## 2018-02-02 ENCOUNTER — Telehealth: Payer: Self-pay | Admitting: Internal Medicine

## 2018-02-02 NOTE — Telephone Encounter (Signed)
Received referral for patient who is a [facility] to be seen for loose stool. Patient has never been seen here and DOD for referral date 6.27.19 is Dr.Perry. Referring physician is Joeseph AmorAdeleh N. Kooshki, ANP and best contact number is [phone]. Please advise on scheduling. [facility]

## 2018-02-02 NOTE — Telephone Encounter (Signed)
Please schedule with first available APP.  Thank you.

## 2018-04-08 DIAGNOSIS — K56609 Unspecified intestinal obstruction, unspecified as to partial versus complete obstruction: Secondary | ICD-10-CM

## 2018-04-08 HISTORY — DX: Unspecified intestinal obstruction, unspecified as to partial versus complete obstruction: K56.609

## 2018-05-07 ENCOUNTER — Emergency Department (HOSPITAL_COMMUNITY): Payer: Medicare Other

## 2018-05-07 ENCOUNTER — Inpatient Hospital Stay (HOSPITAL_COMMUNITY)
Admission: EM | Admit: 2018-05-07 | Discharge: 2018-05-10 | DRG: 690 | Disposition: A | Discharge status: Home / Self Care | Payer: Medicare Other | Attending: Internal Medicine | Admitting: Internal Medicine

## 2018-05-07 ENCOUNTER — Encounter (HOSPITAL_COMMUNITY): Payer: Self-pay | Admitting: Emergency Medicine

## 2018-05-07 ENCOUNTER — Observation Stay (HOSPITAL_COMMUNITY): Payer: Medicare Other

## 2018-05-07 ENCOUNTER — Other Ambulatory Visit: Payer: Self-pay

## 2018-05-07 DIAGNOSIS — Z87891 Personal history of nicotine dependence: Secondary | ICD-10-CM | POA: Diagnosis not present

## 2018-05-07 DIAGNOSIS — R1084 Generalized abdominal pain: Secondary | ICD-10-CM | POA: Diagnosis not present

## 2018-05-07 DIAGNOSIS — E785 Hyperlipidemia, unspecified: Secondary | ICD-10-CM | POA: Diagnosis not present

## 2018-05-07 DIAGNOSIS — Z811 Family history of alcohol abuse and dependence: Secondary | ICD-10-CM | POA: Diagnosis not present

## 2018-05-07 DIAGNOSIS — I5032 Chronic diastolic (congestive) heart failure: Secondary | ICD-10-CM | POA: Diagnosis present

## 2018-05-07 DIAGNOSIS — Z7901 Long term (current) use of anticoagulants: Secondary | ICD-10-CM | POA: Diagnosis not present

## 2018-05-07 DIAGNOSIS — Z4659 Encounter for fitting and adjustment of other gastrointestinal appliance and device: Secondary | ICD-10-CM

## 2018-05-07 DIAGNOSIS — E7800 Pure hypercholesterolemia, unspecified: Secondary | ICD-10-CM | POA: Diagnosis not present

## 2018-05-07 DIAGNOSIS — Z791 Long term (current) use of non-steroidal anti-inflammatories (NSAID): Secondary | ICD-10-CM | POA: Diagnosis not present

## 2018-05-07 DIAGNOSIS — I69354 Hemiplegia and hemiparesis following cerebral infarction affecting left non-dominant side: Secondary | ICD-10-CM

## 2018-05-07 DIAGNOSIS — N3943 Post-void dribbling: Secondary | ICD-10-CM | POA: Diagnosis present

## 2018-05-07 DIAGNOSIS — K30 Functional dyspepsia: Secondary | ICD-10-CM | POA: Diagnosis present

## 2018-05-07 DIAGNOSIS — N390 Urinary tract infection, site not specified: Secondary | ICD-10-CM | POA: Diagnosis not present

## 2018-05-07 DIAGNOSIS — K56609 Unspecified intestinal obstruction, unspecified as to partial versus complete obstruction: Secondary | ICD-10-CM | POA: Diagnosis present

## 2018-05-07 DIAGNOSIS — N400 Enlarged prostate without lower urinary tract symptoms: Secondary | ICD-10-CM | POA: Diagnosis not present

## 2018-05-07 DIAGNOSIS — Z79899 Other long term (current) drug therapy: Secondary | ICD-10-CM | POA: Diagnosis not present

## 2018-05-07 DIAGNOSIS — B9620 Unspecified Escherichia coli [E. coli] as the cause of diseases classified elsewhere: Secondary | ICD-10-CM | POA: Diagnosis not present

## 2018-05-07 DIAGNOSIS — R269 Unspecified abnormalities of gait and mobility: Secondary | ICD-10-CM | POA: Diagnosis not present

## 2018-05-07 DIAGNOSIS — I2782 Chronic pulmonary embolism: Secondary | ICD-10-CM | POA: Diagnosis not present

## 2018-05-07 DIAGNOSIS — I2699 Other pulmonary embolism without acute cor pulmonale: Secondary | ICD-10-CM | POA: Diagnosis present

## 2018-05-07 DIAGNOSIS — N3000 Acute cystitis without hematuria: Secondary | ICD-10-CM

## 2018-05-07 DIAGNOSIS — R109 Unspecified abdominal pain: Secondary | ICD-10-CM | POA: Diagnosis present

## 2018-05-07 DIAGNOSIS — E7849 Other hyperlipidemia: Secondary | ICD-10-CM

## 2018-05-07 HISTORY — DX: Unspecified intestinal obstruction, unspecified as to partial versus complete obstruction: K56.609

## 2018-05-07 LAB — URINALYSIS, ROUTINE W REFLEX MICROSCOPIC
BILIRUBIN URINE: NEGATIVE
GLUCOSE, UA: NEGATIVE mg/dL
Ketones, ur: 5 mg/dL — AB
NITRITE: POSITIVE — AB
Protein, ur: 30 mg/dL — AB
Specific Gravity, Urine: 1.025 (ref 1.005–1.030)
pH: 5 (ref 5.0–8.0)

## 2018-05-07 LAB — COMPREHENSIVE METABOLIC PANEL
ALBUMIN: 4.1 g/dL (ref 3.5–5.0)
ALK PHOS: 74 U/L (ref 38–126)
ALT: 24 U/L (ref 0–44)
AST: 26 U/L (ref 15–41)
Anion gap: 10 (ref 5–15)
BILIRUBIN TOTAL: 1.5 mg/dL — AB (ref 0.3–1.2)
BUN: 22 mg/dL (ref 8–23)
CO2: 21 mmol/L — ABNORMAL LOW (ref 22–32)
Calcium: 9 mg/dL (ref 8.9–10.3)
Chloride: 107 mmol/L (ref 98–111)
Creatinine, Ser: 1.07 mg/dL (ref 0.61–1.24)
Glucose, Bld: 129 mg/dL — ABNORMAL HIGH (ref 70–99)
POTASSIUM: 3.7 mmol/L (ref 3.5–5.1)
SODIUM: 138 mmol/L (ref 135–145)
TOTAL PROTEIN: 7.1 g/dL (ref 6.5–8.1)

## 2018-05-07 LAB — I-STAT TROPONIN, ED: Troponin i, poc: 0.01 ng/mL (ref 0.00–0.08)

## 2018-05-07 LAB — I-STAT CG4 LACTIC ACID, ED: Lactic Acid, Venous: 1.02 mmol/L (ref 0.5–1.9)

## 2018-05-07 LAB — CBC
HEMATOCRIT: 50.6 % (ref 39.0–52.0)
HEMOGLOBIN: 16.6 g/dL (ref 13.0–17.0)
MCH: 29.4 pg (ref 26.0–34.0)
MCHC: 32.8 g/dL (ref 30.0–36.0)
MCV: 89.6 fL (ref 78.0–100.0)
Platelets: 240 10*3/uL (ref 150–400)
RBC: 5.65 MIL/uL (ref 4.22–5.81)
RDW: 13.9 % (ref 11.5–15.5)
WBC: 19.6 10*3/uL — AB (ref 4.0–10.5)

## 2018-05-07 LAB — HEPARIN LEVEL (UNFRACTIONATED)
HEPARIN UNFRACTIONATED: 0.22 [IU]/mL — AB (ref 0.30–0.70)
Heparin Unfractionated: <0.1 IU/mL — ABNORMAL LOW (ref 0.30–0.70)

## 2018-05-07 LAB — LIPASE, BLOOD: Lipase: 29 U/L (ref 11–51)

## 2018-05-07 MED ORDER — IOPAMIDOL (ISOVUE-370) INJECTION 76%: 100.0000 mL | Freq: Once | INTRAVENOUS | Status: DC | PRN

## 2018-05-07 MED ORDER — ACETAMINOPHEN 325 MG PO TABS: 650.0000 mg | ORAL_TABLET | Freq: Four times a day (QID) | ORAL | Status: DC | PRN

## 2018-05-07 MED ORDER — SODIUM CHLORIDE 0.9 % IV SOLN
INTRAVENOUS | Status: AC
  Administered 2018-05-07: 1 mL via INTRAVENOUS

## 2018-05-07 MED ORDER — ROSUVASTATIN CALCIUM 10 MG PO TABS
10.0000 mg | ORAL_TABLET | Freq: Every day | ORAL | Status: DC
  Administered 2018-05-08 – 2018-05-09 (×2): 10 mg via ORAL
  Filled 2018-05-07 (×3): qty 1

## 2018-05-07 MED ORDER — HEPARIN (PORCINE) IN NACL 100-0.45 UNIT/ML-% IJ SOLN
1700.0000 [IU]/h | INTRAMUSCULAR | Status: DC
  Administered 2018-05-07: 1400 [IU]/h via INTRAVENOUS
  Administered 2018-05-08: 1700 [IU]/h via INTRAVENOUS
  Filled 2018-05-07 (×3): qty 250

## 2018-05-07 MED ORDER — ONDANSETRON HCL 4 MG PO TABS: 4.0000 mg | ORAL_TABLET | Freq: Four times a day (QID) | ORAL | Status: DC | PRN

## 2018-05-07 MED ORDER — IOPAMIDOL (ISOVUE-370) INJECTION 76%
INTRAVENOUS | Status: AC
  Administered 2018-05-07: 100 mL
  Filled 2018-05-07: qty 100

## 2018-05-07 MED ORDER — ROSUVASTATIN CALCIUM 10 MG PO TABS
10.0000 mg | ORAL_TABLET | Freq: Every day | ORAL | Status: DC
  Filled 2018-05-07: qty 1

## 2018-05-07 MED ORDER — TAMSULOSIN HCL 0.4 MG PO CAPS
0.4000 mg | ORAL_CAPSULE | Freq: Every day | ORAL | Status: DC
  Administered 2018-05-07 – 2018-05-09 (×3): 0.4 mg via ORAL
  Filled 2018-05-07 (×3): qty 1

## 2018-05-07 MED ORDER — HEPARIN BOLUS VIA INFUSION
4000.0000 [IU] | Freq: Once | INTRAVENOUS | Status: AC
  Administered 2018-05-07: 4000 [IU] via INTRAVENOUS
  Filled 2018-05-07: qty 4000

## 2018-05-07 MED ORDER — SODIUM CHLORIDE 0.9 % IV SOLN
1.0000 g | Freq: Once | INTRAVENOUS | Status: AC
  Administered 2018-05-07: 1 g via INTRAVENOUS
  Filled 2018-05-07: qty 10

## 2018-05-07 MED ORDER — ACETAMINOPHEN 650 MG RE SUPP: 650.0000 mg | Freq: Four times a day (QID) | RECTAL | Status: DC | PRN

## 2018-05-07 MED ORDER — SODIUM CHLORIDE 0.9 % IV SOLN
1.0000 g | INTRAVENOUS | Status: DC
  Administered 2018-05-08 – 2018-05-09 (×2): 1 g via INTRAVENOUS
  Filled 2018-05-07 (×3): qty 10

## 2018-05-07 MED ORDER — ONDANSETRON HCL 4 MG/2ML IJ SOLN: 4.0000 mg | Freq: Four times a day (QID) | INTRAMUSCULAR | Status: DC | PRN

## 2018-05-07 MED ORDER — HEPARIN BOLUS VIA INFUSION
2500.0000 [IU] | Freq: Once | INTRAVENOUS | Status: AC
  Administered 2018-05-07: 2500 [IU] via INTRAVENOUS
  Filled 2018-05-07: qty 2500

## 2018-05-07 NOTE — ED Notes (Signed)
After pt voided, bladder scanner used, 50ml left in bladder.

## 2018-05-07 NOTE — ED Provider Notes (Signed)
TIME SEEN: 2:23 AM  CHIEF COMPLAINT: Abdominal pain  HPI: Patient is a 67 year old male with history of previous stroke with left-sided weakness, pulmonary embolus no longer on anticoagulation, bowel obstruction who presents to the emergency department with complaints of abdominal pressure that started tonight with nausea.  Also states he is having a hard time urinating this evening.  Does have a history of BPH.  Has been able to urinate several times in the ED.  No vomiting or diarrhea.  Last bowel movement was yesterday and was normal.  No fever.  States he is also had indigestion which is symptoms that he had when he had his pulmonary embolus.  He denies any shortness of breath.  He states he has had previous bowel obstruction and feels like his abdominal pain and nausea are similar to that as well.  ROS: See HPI Constitutional: no fever  Eyes: no drainage  ENT: no runny nose   Cardiovascular:   chest pain  Resp: no SOB  GI: no vomiting GU: no dysuria Integumentary: no rash  Allergy: no hives  Musculoskeletal: no leg swelling  Neurological: no slurred speech ROS otherwise negative  PAST MEDICAL HISTORY/PAST SURGICAL HISTORY:  Past Medical History:  Diagnosis Date  . BPH (benign prostatic hyperplasia)   . Chronic diastolic CHF (congestive heart failure) (HCC) 01/16/2018  . CVA (cerebral vascular accident) (HCC) 1984   left side affected.    . Hypercholesterolemia     MEDICATIONS:  Prior to Admission medications   Medication Sig Start Date End Date Taking? Authorizing Provider  Rivaroxaban 15 & 20 MG TBPK Take as directed on package: Start with one 15mg  tablet by mouth twice a day with food. On Day 22, switch to one 20mg  tablet once a day with food. 01/18/18   Hollice Espy, MD  rosuvastatin (CRESTOR) 10 MG tablet Take 10 mg by mouth daily. 09/28/16   [provider]  tamsulosin (FLOMAX) 0.4 MG CAPS capsule Take 0.4 mg by mouth at bedtime.  09/28/16   [provider]    ALLERGIES:  No Known Allergies  SOCIAL HISTORY:  Social History   Tobacco Use  . Smoking status: Former Games developer  . Smokeless tobacco: Never Used  Substance Use Topics  . Alcohol use: Yes    Comment: occasionally    FAMILY HISTORY: Family History  Problem Relation Age of Onset  . Alcoholism Mother   . Kidney Stones Father     EXAM: BP (!) 87/69 (BP Location: Right Arm)   Pulse (!) 115   Temp 99 F (37.2 C) (Oral)   Resp 18   Ht 6\' 1"  (1.854 m)   Wt 90.7 kg   SpO2 94%   BMI 26.39 kg/m  CONSTITUTIONAL: Alert and oriented and responds appropriately to questions. Well-appearing; well-nourished HEAD: Normocephalic EYES: Conjunctivae clear, pupils appear equal, EOMI ENT: normal nose; moist mucous membranes NECK: Supple, no meningismus, no nuchal rigidity, no LAD  CARD: RRR; S1 and S2 appreciated; no murmurs, no clicks, no rubs, no gallops RESP: Normal chest excursion without splinting or tachypnea; breath sounds clear and equal bilaterally; no wheezes, no rhonchi, no rales, no hypoxia or respiratory distress, speaking full sentences ABD/GI: Normal bowel sounds; non-distended; soft, tender throughout the abdomen, no rebound, no guarding, no peritoneal signs, no hepatosplenomegaly BACK:  The back appears normal and is non-tender to palpation, there is no CVA tenderness EXT: Normal ROM in all joints; non-tender to palpation; no edema; normal capillary refill; no cyanosis, no calf  tenderness or swelling    SKIN: Normal color for age and race; warm; no rash NEURO: Left-sided weakness with contraction of the upper extremity which is patient's baseline PSYCH: The patient's mood and manner are appropriate. Grooming and personal hygiene are appropriate.  MEDICAL DECISION MAKING: Patient here with complaints of chest pain that feels like indigestion as well as abdominal pain, difficulty urinating.  Initially hypertensive but this is improved without intervention.   Afebrile.  Differential includes pneumonia, PE, bowel obstruction, colitis, diverticulitis, gastroenteritis.  Less likely ACS or dissection.  Will obtain labs, urine, CT imaging for further evaluation.  He declines any pain medication at this time.  ED PROGRESS: Patient's rectal temperature is 99.5.  Postvoid residual is 50 mL.  Patient does appear to have a nitrite positive urinary tract infection.  We will send culture and treat with ceftriaxone.  Labs show leukocytosis but otherwise unremarkable.  Negative troponin.  EKG shows no ischemic abnormality.  Patient's tachycardia has improved.    CT scan shows no acute pulmonary embolus.  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  He does have a fluid-filled nondilated small bowel which could represent early bowel obstruction.  He states this feels similar to his previous bowel obstructions.  Will place NG tube and admit.    5:34 AM Discussed patient's case with hospitalist, Dr. Toniann Fail.  I have recommended admission and patient (and family if present) agree with this plan. Admitting physician will place admission orders.  Hospitalist has requested general surgery consult.  I reviewed all nursing notes, vitals, pertinent previous records, EKGs, lab and urine results, imaging (as available).    5:46 AM  D/w Dr. Luisa Hart with general surgery.  They will consult on patient in the morning.    EKG Interpretation  Date/Time:  Monday May 07 2018 00:55:03 EDT Ventricular Rate:  122 PR Interval:  120 QRS Duration: 90 QT Interval:  320 QTC Calculation: 456 R Axis:   5 Text Interpretation:  Sinus tachycardia with frequent Premature ventricular complexes in a pattern of bigeminy Nonspecific T wave abnormality Abnormal ECG Rate faster than previous EKG Bigeminy is new Confirmed by Rochele Raring [phone]) on 05/07/2018 2:23:58 AM        CRITICAL CARE Performed by: Baxter Hire Carin Shipp   Total  critical care time: 45 minutes  Critical care time was exclusive of separately billable procedures and treating other patients.  Critical care was necessary to treat or prevent imminent or life-threatening deterioration.  Critical care was time spent personally by me on the following activities: development of treatment plan with patient and/or surrogate as well as nursing, discussions with consultants, evaluation of patient's response to treatment, examination of patient, obtaining history from patient or surrogate, ordering and performing treatments and interventions, ordering and review of laboratory studies, ordering and review of radiographic studies, pulse oximetry and re-evaluation of patient's condition.    Yoniel Arkwright, Layla Maw, DO 05/07/18 [phone]

## 2018-05-07 NOTE — ED Notes (Signed)
Charge RN notified of pt needing a room.

## 2018-05-07 NOTE — Progress Notes (Signed)
ANTICOAGULATION CONSULT NOTE  Pharmacy Consult for Heparin Indication: pulmonary embolus  No Known Allergies  Patient Measurements: Height: 6\' 1"  (185.4 cm) Weight: 200 lb (90.7 kg) IBW/kg (Calculated) : 79.9 Heparin Dosing Weight: 90 kg  Vital Signs: Temp: 98.4 F (36.9 C) (09/30 1421) Temp Source: Oral (09/30 1421) BP: 128/83 (09/30 1421) Pulse Rate: 104 (09/30 1421)  Labs: Recent Labs    05/07/18 0057 05/07/18 1407 05/07/18 1903  HGB 16.6  --   --   HCT 50.6  --   --   PLT 240  --   --   HEPARINUNFRC  --  0.22* <0.10*  CREATININE 1.07  --   --     Estimated Creatinine Clearance: 74.7 mL/min (by C-G formula based on SCr of 1.07 mg/dL).   Medical History: Past Medical History:  Diagnosis Date  . BPH (benign prostatic hyperplasia)   . Chronic diastolic CHF (congestive heart failure) (HCC) 01/16/2018  . CVA (cerebral vascular accident) (HCC) 1984   left side affected.    . Hypercholesterolemia   . SBO (small bowel obstruction) (HCC) 04/2018    Assessment: 67 YOM admitted for abd pain. He has hx of PE, previously on xarelto, but not longer taking it per med hx. Pharmacy is consulted to start IV heparin at CTA shows chronic thrombus. Initial heparin level 0.22 despite being drawn early, now heparin undetectable. Verified with RN and heparin is infusion at 1400 units/hr with no issues and has not been stopped. No S/Sx bleeding noted.  Goal of Therapy:  Heparin level 0.3-0.7 units/ml Monitor platelets by anticoagulation protocol: Yes   Plan:  -Heparin 2500 units x1 -Increase heparin infusion to 1700 units/r -Recheck 8hr heparin level with am labs  Fredonia Highland, PharmD, BCPS Clinical Pharmacist [phone] Please check AMION for all [facility] Pharmacy numbers 05/07/2018

## 2018-05-07 NOTE — ED Triage Notes (Signed)
Pt to triage via [facility].  Initially reported unable to urinate since 4pm but urinated 3 times PTA.  C/o upper abd discomfort with mild nausea and constipation.  Also reports feeling of indigestion.

## 2018-05-07 NOTE — H&P (Signed)
History and Physical    Jason Myers:[id number] DOB: [birth year] DOA: 05/07/2018  PCP: Gean Birchwood, [facility] [place] Consultants:  None Patient coming from:  Home - lives alone; NOK: brother  Chief Complaint: chest/abdominal pain  HPI: Jason Myers is a 67 y.o. male with medical history significant of HLD; hemorrhagic CVA s/p ruptured aneurysm (1984); chronic diastolic CHF; BPH; and hospitalization for acute PE (no longer on [facility]) and ileitis in 6/19 presenting with abdominal pain.  He reports that he was here with a PE and SBO in June.  He stopped taking anticoagulation after hospital discharge due to "bad teeth that need to be extracted."  Yesterday, he went to bed early but had difficulty sleeping.  He had abdominal pressure and indigestion.  He also noticed difficulty urinating - he had hesitancy and then post-void dribbling and he sailed his pants.  He noticed indigestion like when he had his prior SBO, but no n/v.  He has not previously had abdominal surgery.  His last BM was Sunday and was unremarkable.  He has not had fever.   ED Course: Carryover, per Dr. Toniann Fail: 67 year old man with recently diagnosed pulmonary taken of anticoagulation by himself possible tooth extraction presents with abdominal pain and nausea vomiting. CT scan shows possibility of early obstruction NG tube is being placed and the ER physician will be consulting surgery.  Review of Systems: As per HPI; otherwise review of systems reviewed and negative.     Past Medical History:  Diagnosis Date  . BPH (benign prostatic hyperplasia)   . Chronic diastolic CHF (congestive heart failure) (HCC) 01/16/2018  . CVA (cerebral vascular accident) (HCC) 1984   left side affected.    . Hypercholesterolemia   . SBO (small bowel obstruction) (HCC) 04/2018    Past Surgical History:  Procedure Laterality Date  . LIPOMA EXCISION Left    groin    Social History   Socioeconomic History  . Marital status: Single    Spouse name: Not on file  . Number of children: Not on file  . Years of education: Not on file  . Highest education level: Not on file  Occupational History  . Not on file  Social Needs  . Financial resource strain: Not on file  . Food insecurity:    Worry: Not on file    Inability: Not on file  . Transportation needs:    Medical: Not on file    Non-medical: Not on file  Tobacco Use  . Smoking status: Former Games developer  . Smokeless tobacco: Never Used  Substance and Sexual Activity  . Alcohol use: Yes    Comment: occasionally  . Drug use: Yes    Types: Marijuana  . Sexual activity: Not on file  Lifestyle  . Physical activity:    Days per week: Not on file    Minutes per session: Not on file  . Stress: Not on file  Relationships  . Social connections:    Talks on phone: Not on file    Gets together: Not on file    Attends religious service: Not on file    Active member of club or organization: Not on file    Attends meetings of clubs or organizations: Not on file    Relationship status: Not on file  . Intimate partner violence:    Fear of current or ex partner: Not on file    Emotionally abused: Not on file    Physically abused: Not on file  Forced sexual activity: Not on file  Other Topics Concern  . Not on file  Social History Narrative  . Not on file    No Known Allergies  Family History  Problem Relation Age of Onset  . Alcoholism Mother   . Kidney Stones Father     Prior to Admission medications   Medication Sig Start Date End Date Taking? Authorizing Provider  naproxen sodium (ALEVE) 220 MG tablet Take 220 mg by mouth 2 (two) times daily as needed (pain).   Yes [provider]  rosuvastatin (CRESTOR) 10 MG tablet Take 10 mg by mouth daily. 09/28/16  Yes [provider]  tamsulosin (FLOMAX) 0.4 MG CAPS capsule Take 0.4 mg by mouth at bedtime.  09/28/16  Yes [provider]  Rivaroxaban 15 & 20 MG TBPK Take as directed on package:  Start with one 15mg  tablet by mouth twice a day with food. On Day 22, switch to one 20mg  tablet once a day with food. Patient not taking: Reported on 05/07/2018 01/18/18   Hollice Espy, MD    Physical Exam: Vitals:   05/07/18 0230 05/07/18 0327 05/07/18 0345 05/07/18 0654  BP: 116/71  112/78 121/86  Pulse: 99  99 100  Resp: 20  15 18   Temp:  99.5 F (37.5 C)  98.9 F (37.2 C)  TempSrc:  Rectal  Oral  SpO2: 94%  90% 97%  Weight:      Height:         General: Appears calm and comfortable and is NAD; he is somewhat disheveled and is quite tangential Eyes:   EOMI, normal lids, iris ENT:  grossly normal hearing, lips & tongue, mmm; poor dentition Neck:  no LAD, masses or thyromegaly; no carotid bruits Cardiovascular:  RRR, no m/r/g. No LE edema.  Respiratory:   CTA bilaterally with no wheezes/rales/rhonchi.  Normal respiratory effort. Abdomen:  soft, NT, ND, NABS Skin:  no rash or induration seen on limited exam Musculoskeletal:  grossly normal tone BUE/BLE, good ROM, no bony abnormality Psychiatric:  grossly normal mood and affect, speech fluent and very tangential, AOx3 Neurologic:  CN 2-12 grossly intact, moves all extremities in coordinated fashion, sensation intact    Radiological Exams on Admission: Dg Abd 1 View  Result Date: 05/07/2018 CLINICAL DATA:  Nasogastric tube placement EXAM: ABDOMEN - 1 VIEW COMPARISON:  CT from earlier today FINDINGS: Nasogastric tube tip the level of the proximal duodenum. The stomach is decompressed. Nonobstructive bowel gas pattern. Contrast in the partially distended bladder from recent enhanced CT. IMPRESSION: 1. Nasogastric tube tip at the proximal duodenum. 2. Nonobstructive bowel gas pattern. Electronically Signed   By: Marnee Spring M.D.   On: 05/07/2018 10:00   Ct Angio Chest Pe W And/or Wo Contrast  Result Date: 05/07/2018 CLINICAL DATA:  PE suspected, high pretest prob; Abd pain, acute, generalized. Vomiting. EXAM: CT  ANGIOGRAPHY CHEST CT ABDOMEN AND PELVIS WITH CONTRAST TECHNIQUE: Multidetector CT imaging of the chest was performed using the standard protocol during bolus administration of intravenous contrast. Multiplanar CT image reconstructions and MIPs were obtained to evaluate the vascular anatomy. Multidetector CT imaging of the abdomen and pelvis was performed using the standard protocol during bolus administration of intravenous contrast. Delayed acquisition of the abdomen pelvis portion of the exam due to vomiting after chest CT. CONTRAST:  ISOVUE-370 IOPAMIDOL (ISOVUE-370) INJECTION 76% COMPARISON:  CT 01/09/2018. FINDINGS: CTA CHEST FINDINGS Cardiovascular: No new filling defects within the pulmonary arteries to suggest acute pulmonary embolus.  Mild residual eccentric wall thickening/chronic thrombus in the segmental branches in the right lower lobe, improved from prior exam. Thoracic aorta is normal in caliber. Heart is normal in size. No pericardial effusion. Mediastinum/Nodes: No enlarged mediastinal or hilar lymph nodes. Enlarged right lobe of the thyroid gland with hypodense nodule, previously biopsied 11/15/2016. The esophagus is nondistended. Lungs/Pleura: Breathing motion artifact partially obscures evaluation. Slight heterogeneity of parenchyma in the upper lobes, similar to prior exam and may be post infectious or inflammatory. Mild right lower lobe scarring. Scattered atelectasis throughout both lungs. No confluent airspace disease. No evidence pulmonary edema. No pleural effusion. Musculoskeletal: There are no acute or suspicious osseous abnormalities. Review of the MIP images confirms the above findings. CT ABDOMEN and PELVIS FINDINGS Hepatobiliary: No focal liver abnormality is seen. No gallstones, gallbladder wall thickening, or biliary dilatation. Pancreas: No ductal dilatation or inflammation. Spleen: Normal in size without focal abnormality. Adrenals/Urinary Tract: No adrenal nodule. No  hydronephrosis. No perinephric edema. Multiple bilateral renal cysts, including a dominant cyst in the upper left kidney spanning 10.2 cm. No suspicious solid lesion. Urinary bladder is partially distended without wall thickening. Stomach/Bowel: Stomach is nondistended. No gastric wall thickening. No evidence of bowel obstruction. Fluid-filled normal caliber small bowel in the lower abdomen without wall thickening or inflammatory change. Colonic diverticulosis of the distal colon without diverticulitis. Normal appendix. Vascular/Lymphatic: No acute vascular finding. Mild aortic atherosclerosis. 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 No enlarged abdominopelvic lymph nodes. Reproductive: Enlarged prostate gland spans 5.9 cm. Central prostatic calcifications. Other: Trace free fluid in the pelvis. No free air. No intra-abdominal abscess. Musculoskeletal: Prominent facet arthropathy in the lower lumbar spine. There are no acute or suspicious osseous abnormalities. Review of the MIP images confirms the above findings. IMPRESSION: 1. No acute pulmonary embolus. Minimal residual chronic thrombus in the right lower lobe pulmonary arteries, improved from exam almost 4 months ago. 2. Scattered atelectasis without other acute chest finding. 3. Fluid-filled nondilated small bowel in the lower abdomen, may represent enteritis or ileus. Very early small bowel obstruction is also considered given similar appearance on prior exam. 4. Incidental findings of colonic diverticulosis, bilateral renal cysts, enlarged prostate gland, and Aortic Atherosclerosis (ICD10-I70.0). Electronically Signed   By: Narda Rutherford M.D.   On: 05/07/2018 04:58   Ct Abdomen Pelvis W Contrast  Result Date: 05/07/2018 CLINICAL DATA:  PE suspected, high pretest prob; Abd pain, acute, generalized. Vomiting. EXAM: CT ANGIOGRAPHY CHEST CT ABDOMEN AND PELVIS WITH CONTRAST TECHNIQUE: Multidetector CT imaging of  the chest was performed using the standard protocol during bolus administration of intravenous contrast. Multiplanar CT image reconstructions and MIPs were obtained to evaluate the vascular anatomy. Multidetector CT imaging of the abdomen and pelvis was performed using the standard protocol during bolus administration of intravenous contrast. Delayed acquisition of the abdomen pelvis portion of the exam due to vomiting after chest CT. CONTRAST:  ISOVUE-370 IOPAMIDOL (ISOVUE-370) INJECTION 76% COMPARISON:  CT 01/09/2018. FINDINGS: CTA CHEST FINDINGS Cardiovascular: No new filling defects within the pulmonary arteries to suggest acute pulmonary embolus. Mild residual eccentric wall thickening/chronic thrombus in the segmental branches in the right lower lobe, improved from prior exam. Thoracic aorta is normal in caliber. Heart is normal in size. No pericardial effusion. Mediastinum/Nodes: No enlarged mediastinal or hilar lymph nodes. Enlarged right lobe of the thyroid gland with hypodense nodule, previously biopsied 11/15/2016. The esophagus is nondistended. Lungs/Pleura: Breathing motion artifact partially obscures evaluation. Slight heterogeneity of parenchyma  in the upper lobes, similar to prior exam and may be post infectious or inflammatory. Mild right lower lobe scarring. Scattered atelectasis throughout both lungs. No confluent airspace disease. No evidence pulmonary edema. No pleural effusion. Musculoskeletal: There are no acute or suspicious osseous abnormalities. Review of the MIP images confirms the above findings. CT ABDOMEN and PELVIS FINDINGS Hepatobiliary: No focal liver abnormality is seen. No gallstones, gallbladder wall thickening, or biliary dilatation. Pancreas: No ductal dilatation or inflammation. Spleen: Normal in size without focal abnormality. Adrenals/Urinary Tract: No adrenal nodule. No hydronephrosis. No perinephric edema. Multiple bilateral renal cysts, including a dominant cyst in  the upper left kidney spanning 10.2 cm. No suspicious solid lesion. Urinary bladder is partially distended without wall thickening. Stomach/Bowel: Stomach is nondistended. No gastric wall thickening. No evidence of bowel obstruction. Fluid-filled normal caliber small bowel in the lower abdomen without wall thickening or inflammatory change. Colonic diverticulosis of the distal colon without diverticulitis. Normal appendix. Vascular/Lymphatic: No acute vascular finding. Mild aortic atherosclerosis. 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 No enlarged abdominopelvic lymph nodes. Reproductive: Enlarged prostate gland spans 5.9 cm. Central prostatic calcifications. Other: Trace free fluid in the pelvis. No free air. No intra-abdominal abscess. Musculoskeletal: Prominent facet arthropathy in the lower lumbar spine. There are no acute or suspicious osseous abnormalities. Review of the MIP images confirms the above findings. IMPRESSION: 1. No acute pulmonary embolus. Minimal residual chronic thrombus in the right lower lobe pulmonary arteries, improved from exam almost 4 months ago. 2. Scattered atelectasis without other acute chest finding. 3. Fluid-filled nondilated small bowel in the lower abdomen, may represent enteritis or ileus. Very early small bowel obstruction is also considered given similar appearance on prior exam. 4. Incidental findings of colonic diverticulosis, bilateral renal cysts, enlarged prostate gland, and Aortic Atherosclerosis (ICD10-I70.0). Electronically Signed   By: Narda Rutherford M.D.   On: 05/07/2018 04:58    EKG: Independently reviewed.  Sinus tachycardia with rate 122; PVCs/bigeminy with no evidence of acute ischemia   Labs on Admission: I have personally reviewed the available labs and imaging studies at the time of the admission.  Pertinent labs:   CO2 21 Glucose 129 Bili 1.5 Troponin 0.01 Lactate 1.02 WBC 19.6 UA: moderate Hgb, 5  ketones, large LE, + nitrite, rare bacteria, >50 WBC   Assessment/Plan Principal Problem:   Abdominal pain Active Problems:   HLD (hyperlipidemia)   PE (pulmonary thromboembolism) (HCC)   Chronic diastolic CHF (congestive heart failure) (HCC)   Acute lower UTI   SBO (small bowel obstruction) (HCC)   Abdominal pain -Patient presenting with acute onset of abdominal pain with indigestion -Also with urinary symptoms at around the same time -Prior presentation with similar symptoms led to diagnosis of SBO -Current CT with possible SBO -Clearly abnormal UA and symptoms c/w UTI  UTI -SIRS criteria in this patient includes: Leukocytosis, tachycardia, tachypnea on presentation  -Patient has no evidence of acute organ failure -Low suspicion for sepsis by ER, but will add blood cultures (although he has already received antibiotics) since this is still a possible diagnosis -Suspected urinary source - he has symptoms as well as abnormal UA -Urine culture pending -Will place in observation status with telemetry and continue to monitor -Treat with IV Rocephin for presumed urinary source -HIV negative in 6/19 -Suspect UTI is the primary source of his symptoms at the time of presentation  Possible SBO -While SBO is possible based on imaging and prior h/o the same,  suspicion for this condition is lower at this time -He denies abdominal pain or n/v -Surgery was consulted by the ER, will await their input -Will d/c NG tube order for now (it was not placed in the ER)  PE -+ PE in 4/18 and 6/19  -He should need lifelong AC -He stopped taking AC right after leaving the hospital last time -He is now aware that he may have significant morbidity and mortality if he is not on lifelong AC -He appears to agree to take these medications -He was previously on Pradaxa and this can likely be restarted once his ?SBO is resolved -For now, will treat with IV heparin  Chronic diastolic CHF -01/20/18 echo  with preserved EF and grade 1 diastolic dysfunction -He appears to be compensated at this time -He is not taking either BB or ACE and would likely benefit from addition of one or both of these medications at some point  HLD -Continue Crestor   DVT prophylaxis:  Lovenox  Code Status:  Full - confirmed with patient Family Communication: None present  Disposition Plan:  Home once clinically improved Consults called: Surgery - called by ER  Admission status: It is my clinical opinion that referral for OBSERVATION is reasonable and necessary in this patient based on the above information provided. The aforementioned taken together are felt to place the patient at high risk for further clinical deterioration. However it is anticipated that the patient may be medically stable for discharge from the hospital within 24 to 48 hours.    Jonah Blue MD [facility]  If note is complete, please contact covering daytime or nighttime physician. [email or web address] Password [email or web address]  05/07/2018, 1:00 PM

## 2018-05-07 NOTE — Progress Notes (Signed)
Pt had NGT this morning and accidentally pulled out by the patient, Dr. Corliss Skains informed and ordered discontinue NGT.

## 2018-05-07 NOTE — ED Notes (Signed)
Attempted to call report

## 2018-05-07 NOTE — ED Notes (Signed)
Patient transported to CT 

## 2018-05-07 NOTE — Consult Note (Signed)
[facility] Surgery Consult Note  Jason Myers [birth year]  [id number].    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 States that he ate meatloaf for dinner last night then started having upper abdominal pain and indigestion. Does not typically take anything for heartburn at home. Denies nausea, vomiting, diarrhea, constipation, fever, chills. States that this felt similar to his previous SBO although less severe. Last BM 9/28. He also reports difficulty with urination, but no dysuria.  ED workup included a CTA chest which ruled out a PE and also was negative for SBO. WBC 19.6, troponin negative, lipase 29, bilirubin 1.5 otherwise LFTs WNL. U/a consistent with UTI, urine culture pending. NG tube was placed but is clamped. General surgery asked to see. Patient states that he is feeling much better this morning. Denies any abdominal pain or nausea. Denies abdominal bloating. He is passing flatus.  Abdominal surgical history: none Lives at home alone Ambulates with a cane  ROS: Review of Systems  Constitutional: Negative.   HENT: Negative.   Eyes: Negative.   Respiratory: Negative.   Cardiovascular: Negative.   Gastrointestinal: Positive for abdominal pain and heartburn. Negative for diarrhea, nausea and vomiting.  Genitourinary: Positive for urgency.  Musculoskeletal: Negative.   Skin: Negative.   Neurological: Negative.    All systems reviewed and otherwise negative except for as above  Family History  Problem Relation Age of Onset  . Alcoholism Mother   . Kidney Stones Father     Past Medical History:  Diagnosis Date  . BPH (benign prostatic hyperplasia)   . Chronic diastolic CHF (congestive heart failure)  ([place]) 01/16/2018  . CVA (cerebral vascular accident) ([place]) 1984   left side affected.    . Hypercholesterolemia     Past Surgical History:  Procedure Laterality Date  . LIPOMA EXCISION Left    groin    Social History:  reports that he has quit smoking. He has never used smokeless tobacco. He reports that he drinks alcohol. He reports that he has current or past drug history. Drug: Marijuana.  Allergies: No Known Allergies  Medications Prior to Admission  Medication Sig Dispense Refill  . naproxen sodium (ALEVE) 220 MG tablet Take 220 mg by mouth 2 (two) times daily as needed (pain).    . rosuvastatin (CRESTOR) 10 MG tablet Take 10 mg by mouth daily.    . tamsulosin (FLOMAX) 0.4 MG CAPS capsule Take 0.4 mg by mouth at bedtime.     . Rivaroxaban 15 & 20 MG TBPK Take as directed on package: Start with one 74m tablet by mouth twice a day with food. On Day 22, switch to one 214mtablet once a day with food. (Patient not taking: Reported on 05/07/2018) 51 each 0    Prior to Admission medications   Medication Sig Start Date End Date Taking? Authorizing Provider  naproxen sodium (ALEVE) 220 MG tablet Take 220 mg by mouth 2 (two) times daily as needed (pain).   Yes [provider]  rosuvastatin (CRESTOR) 10 MG tablet Take 10 mg by mouth daily. 09/28/16  Yes [provider]  tamsulosin (FLOMAX) 0.4 MG CAPS capsule Take 0.4 mg by mouth at bedtime.  09/28/16  Yes [provider]  Rivaroxaban 15 & 20 MG  TBPK Take as directed on package: Start with one 12m tablet by mouth twice a day with food. On Day 22, switch to one 229mtablet once a day with food. Patient not taking: Reported on 05/07/2018 01/18/18   KrAnnita BrodMD    Blood pressure 121/86, pulse 100, temperature 98.9 F (37.2 C), temperature source Oral, resp. rate 18, height _0  (1.854 m), weight 90.7 kg, SpO2 97 %. Physical Exam: General: pleasant, WD/WN white male who is laying in bed in NAD HEENT:  head is normocephalic, atraumatic.  Sclera are noninjected.  Pupils equal and round.  Ears and nose without any masses or lesions.  Mouth is pink and moist. Dentition fair Heart: regular rate.  No obvious murmurs, gallops, or rubs noted.  Palpable pedal pulses bilaterally Lungs: CTAB, no wheezes, rhonchi, or rales noted.  Respiratory effort nonlabored Abd: soft, NT/ND, +BS, no masses, hernias, or organomegaly MS: no edema or clubbing BUE/BLE. Contracture noted to LUE. Left-sided weakness Skin: warm and dry with no masses, lesions, or rashes Psych: A&Ox3 with an appropriate affect. Neuro: cranial nerves grossly intact, normal speech  Results for orders placed or performed during the hospital encounter of 05/07/18 (from the past 48 hour(s))  Lipase, blood     Status: None   Collection Time: 05/07/18 12:57 AM  Result Value Ref Range   Lipase 29 11 - 51 U/L    Comment: Performed at [facility] [place] [id number] metabolic panel     Status: Abnormal   Collection Time: 05/07/18 12:57 AM  Result Value Ref Range   Sodium 138 135 - 145 mmol/L   Potassium 3.7 3.5 - 5.1 mmol/L   Chloride 107 98 - 111 mmol/L   CO2 21 (L) 22 - 32 mmol/L   Glucose, Bld 129 (H) 70 - 99 mg/dL   BUN 22 8 - 23 mg/dL   Creatinine, Ser 1.07 0.61 - 1.24 mg/dL   Calcium 9.0 8.9 - 10.3 mg/dL   Total Protein 7.1 6.5 - 8.1 g/dL   Albumin 4.1 3.5 - 5.0 g/dL   AST 26 15 - 41 U/L   ALT 24 0 - 44 U/L   Alkaline Phosphatase 74 38 - 126 U/L   Total Bilirubin 1.5 (H) 0.3 - 1.2 mg/dL   GFR calc non Af Amer >60 >60 mL/min   GFR calc Af Amer >60 >60 mL/min    Comment: (NOTE) The eGFR has been calculated using the CKD EPI equation. This calculation has not been validated in all clinical situations. eGFR's persistently <60 mL/min signify possible Chronic Kidney Disease.    Anion gap 10 5 - 15    Comment: Performed at [facility] [place] CBC     Status: Abnormal   Collection Time: 05/07/18 12:57 AM  Result Value Ref Range   WBC 19.6 (H) 4.0 - 10.5 K/uL   RBC 5.65 4.22 - 5.81 MIL/uL   Hemoglobin 16.6 13.0 - 17.0 g/dL   HCT 50.6 39.0 - 52.0 %   MCV 89.6 78.0 - 100.0 fL   MCH 29.4 26.0 - 34.0 pg   MCHC 32.8 30.0 - 36.0 g/dL   RDW 13.9 11.5 - 15.5 %   Platelets 240 150 - 400 K/uL    Comment: Performed at [facility] [place] [id number]-stat troponin, ED     Status: None  Collection Time: 05/07/18  1:02 AM  Result Value Ref Range   Troponin i, poc 0.01 0.00 - 0.08 ng/mL   Comment 3            Comment: Due to the release kinetics of cTnI, a negative result within the first hours of the onset of symptoms does not rule out myocardial infarction with certainty. If myocardial infarction is still suspected, repeat the test at appropriate intervals.   I-Stat CG4 Lactic Acid, ED     Status: None   Collection Time: 05/07/18  1:05 AM  Result Value Ref Range   Lactic Acid, Venous 1.02 0.5 - 1.9 mmol/L  Urinalysis, Routine w reflex microscopic     Status: Abnormal   Collection Time: 05/07/18  3:06 AM  Result Value Ref Range   Color, Urine AMBER (A) YELLOW    Comment: BIOCHEMICALS MAY BE AFFECTED BY COLOR   APPearance HAZY (A) CLEAR   Specific Gravity, Urine 1.025 1.005 - 1.030   pH 5.0 5.0 - 8.0   Glucose, UA NEGATIVE NEGATIVE mg/dL   Hgb urine dipstick MODERATE (A) NEGATIVE   Bilirubin Urine NEGATIVE NEGATIVE   Ketones, ur 5 (A) NEGATIVE mg/dL   Protein, ur 30 (A) NEGATIVE mg/dL   Nitrite POSITIVE (A) NEGATIVE   Leukocytes, UA LARGE (A) NEGATIVE   RBC / HPF 0-5 0 - 5 RBC/hpf   WBC, UA >50 (H) 0 - 5 WBC/hpf   Bacteria, UA RARE (A) NONE SEEN   Squamous Epithelial / LPF 0-5 0 - 5   Mucus PRESENT     Comment: Performed at [facility], [address] [place], [place] [address]   Ct Angio Chest Pe W And/or Wo Contrast  Result Date: 05/07/2018 CLINICAL DATA:  PE suspected,  high pretest prob; Abd pain, acute, generalized. Vomiting. EXAM: CT ANGIOGRAPHY CHEST CT ABDOMEN AND PELVIS WITH CONTRAST TECHNIQUE: Multidetector CT imaging of the chest was performed using the standard protocol during bolus administration of intravenous contrast. Multiplanar CT image reconstructions and MIPs were obtained to evaluate the vascular anatomy. Multidetector CT imaging of the abdomen and pelvis was performed using the standard protocol during bolus administration of intravenous contrast. Delayed acquisition of the abdomen pelvis portion of the exam due to vomiting after chest CT. CONTRAST:  143m ISOVUE-370 IOPAMIDOL (ISOVUE-370) INJECTION 76% COMPARISON:  CT 01/09/2018. FINDINGS: CTA CHEST FINDINGS Cardiovascular: No new filling defects within the pulmonary arteries to suggest acute pulmonary embolus. Mild residual eccentric wall thickening/chronic thrombus in the segmental branches in the right lower lobe, improved from prior exam. Thoracic aorta is normal in caliber. Heart is normal in size. No pericardial effusion. Mediastinum/Nodes: No enlarged mediastinal or hilar lymph nodes. Enlarged right lobe of the thyroid gland with hypodense nodule, previously biopsied 11/15/2016. The esophagus is nondistended. Lungs/Pleura: Breathing motion artifact partially obscures evaluation. Slight heterogeneity of parenchyma in the upper lobes, similar to prior exam and may be post infectious or inflammatory. Mild right lower lobe scarring. Scattered atelectasis throughout both lungs. No confluent airspace disease. No evidence pulmonary edema. No pleural effusion. Musculoskeletal: There are no acute or suspicious osseous abnormalities. Review of the MIP images confirms the above findings. CT ABDOMEN and PELVIS FINDINGS Hepatobiliary: No focal liver abnormality is seen. No gallstones, gallbladder wall thickening, or biliary dilatation. Pancreas: No ductal dilatation or inflammation. Spleen: Normal in size without  focal abnormality. Adrenals/Urinary Tract: No adrenal nodule. No hydronephrosis. No perinephric edema. Multiple bilateral renal cysts, including a dominant cyst in the upper  left kidney spanning 10.2 cm. No suspicious solid lesion. Urinary bladder is partially distended without wall thickening. Stomach/Bowel: Stomach is nondistended. No gastric wall thickening. No evidence of bowel obstruction. Fluid-filled normal caliber small bowel in the lower abdomen without wall thickening or inflammatory change. Colonic diverticulosis of the distal colon without diverticulitis. Normal appendix. Vascular/Lymphatic: No acute vascular finding. Mild aortic atherosclerosis. 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 No enlarged abdominopelvic lymph nodes. Reproductive: Enlarged prostate gland spans 5.9 cm. Central prostatic calcifications. Other: Trace free fluid in the pelvis. No free air. No intra-abdominal abscess. Musculoskeletal: Prominent facet arthropathy in the lower lumbar spine. There are no acute or suspicious osseous abnormalities. Review of the MIP images confirms the above findings. IMPRESSION: 1. No acute pulmonary embolus. Minimal residual chronic thrombus in the right lower lobe pulmonary arteries, improved from exam almost 4 months ago. 2. Scattered atelectasis without other acute chest finding. 3. Fluid-filled nondilated small bowel in the lower abdomen, may represent enteritis or ileus. Very early small bowel obstruction is also considered given similar appearance on prior exam. 4. Incidental findings of colonic diverticulosis, bilateral renal cysts, enlarged prostate gland, and Aortic Atherosclerosis (ICD10-I70.0). Electronically Signed   By: Keith Rake M.D.   On: 05/07/2018 04:58   Ct Abdomen Pelvis W Contrast  Result Date: 05/07/2018 CLINICAL DATA:  PE suspected, high pretest prob; Abd pain, acute, generalized. Vomiting. EXAM: CT ANGIOGRAPHY CHEST CT ABDOMEN  AND PELVIS WITH CONTRAST TECHNIQUE: Multidetector CT imaging of the chest was performed using the standard protocol during bolus administration of intravenous contrast. Multiplanar CT image reconstructions and MIPs were obtained to evaluate the vascular anatomy. Multidetector CT imaging of the abdomen and pelvis was performed using the standard protocol during bolus administration of intravenous contrast. Delayed acquisition of the abdomen pelvis portion of the exam due to vomiting after chest CT. CONTRAST:  167m ISOVUE-370 IOPAMIDOL (ISOVUE-370) INJECTION 76% COMPARISON:  CT 01/09/2018. FINDINGS: CTA CHEST FINDINGS Cardiovascular: No new filling defects within the pulmonary arteries to suggest acute pulmonary embolus. Mild residual eccentric wall thickening/chronic thrombus in the segmental branches in the right lower lobe, improved from prior exam. Thoracic aorta is normal in caliber. Heart is normal in size. No pericardial effusion. Mediastinum/Nodes: No enlarged mediastinal or hilar lymph nodes. Enlarged right lobe of the thyroid gland with hypodense nodule, previously biopsied 11/15/2016. The esophagus is nondistended. Lungs/Pleura: Breathing motion artifact partially obscures evaluation. Slight heterogeneity of parenchyma in the upper lobes, similar to prior exam and may be post infectious or inflammatory. Mild right lower lobe scarring. Scattered atelectasis throughout both lungs. No confluent airspace disease. No evidence pulmonary edema. No pleural effusion. Musculoskeletal: There are no acute or suspicious osseous abnormalities. Review of the MIP images confirms the above findings. CT ABDOMEN and PELVIS FINDINGS Hepatobiliary: No focal liver abnormality is seen. No gallstones, gallbladder wall thickening, or biliary dilatation. Pancreas: No ductal dilatation or inflammation. Spleen: Normal in size without focal abnormality. Adrenals/Urinary Tract: No adrenal nodule. No hydronephrosis. No perinephric  edema. Multiple bilateral renal cysts, including a dominant cyst in the upper left kidney spanning 10.2 cm. No suspicious solid lesion. Urinary bladder is partially distended without wall thickening. Stomach/Bowel: Stomach is nondistended. No gastric wall thickening. No evidence of bowel obstruction. Fluid-filled normal caliber small bowel in the lower abdomen without wall thickening or inflammatory change. Colonic diverticulosis of the distal colon without diverticulitis. Normal appendix. Vascular/Lymphatic: No acute vascular finding. Mild aortic atherosclerosis. Mild right common iliac artery aneurysm  measuring 2.1 cm, unchanged from prior exam. No enlarged abdominopelvic lymph nodes. Reproductive: Enlarged prostate gland spans 5.9 cm. Central prostatic calcifications. Other: Trace free fluid in the pelvis. No free air. No intra-abdominal abscess. Musculoskeletal: Prominent facet arthropathy in the lower lumbar spine. There are no acute or suspicious osseous abnormalities. Review of the MIP images confirms the above findings. IMPRESSION: 1. No acute pulmonary embolus. Minimal residual chronic thrombus in the right lower lobe pulmonary arteries, improved from exam almost 4 months ago. 2. Scattered atelectasis without other acute chest finding. 3. Fluid-filled nondilated small bowel in the lower abdomen, may represent enteritis or ileus. Very early small bowel obstruction is also considered given similar appearance on prior exam. 4. Incidental findings of colonic diverticulosis, bilateral renal cysts, enlarged prostate gland, and Aortic Atherosclerosis (ICD10-I70.0). Electronically Signed   By: Keith Rake M.D.   On: 05/07/2018 04:58   Anti-infectives (From admission, onward)   Start     Dose/Rate Route Frequency Ordered Stop   05/08/18 0600  cefTRIAXone (ROCEPHIN) 1 g in sodium chloride 0.9 % 100 mL IVPB     1 g 200 mL/hr over 30 Minutes Intravenous Every 24 hours 05/07/18 0846     05/07/18 0430   cefTRIAXone (ROCEPHIN) 1 g in sodium chloride 0.9 % 100 mL IVPB     1 g 200 mL/hr over 30 Minutes Intravenous  Once 05/07/18 0418 05/07/18 0631        Assessment/Plan H/o stroke (1984) with left hemiplegia CHF BPH HLD H/o PE - per patient no longer on anticoagulation UTI - Ucx pending, on rocephin  Abdominal pain Indigestion  ?SBO - CT scan negative for SBO, and patient is asymptomatic today. States that he is passing gas and denies any abdominal pain, nausea, or vomiting. This could have been an episode of biliary colic or possibly indigestion, but patient is asymptomatic today so I would not recommend an abdominal u/s at this time. I do not think he needs an NG tube. Agree with clear liquids, advance diet as tolerated.   ID - rocephin 9/30>> VTE - SCDs, per primary FEN - IVF, CLD Foley - none  Wellington Hampshire, [facility] Surgery 05/07/2018, 9:28 AM Pager: [phone] Mon 7:00 am -11:30 AM Tues-Fri 7:00 am-4:30 pm Sat-Sun 7:00 am-11:30 am

## 2018-05-07 NOTE — Progress Notes (Signed)
ANTICOAGULATION CONSULT NOTE - Initial Consult  Pharmacy Consult for Heparin Indication: pulmonary embolus  No Known Allergies  Patient Measurements: Height: 6\' 1"  (185.4 cm) Weight: 200 lb (90.7 kg) IBW/kg (Calculated) : 79.9 Heparin Dosing Weight: 90 kg  Vital Signs: Temp: 99.5 F (37.5 C) (09/30 0327) Temp Source: Rectal (09/30 0327) BP: 116/71 (09/30 0230) Pulse Rate: 99 (09/30 0230)  Labs: Recent Labs    05/07/18 0057  HGB 16.6  HCT 50.6  PLT 240  CREATININE 1.07    Estimated Creatinine Clearance: 74.7 mL/min (by C-G formula based on SCr of 1.07 mg/dL).   Medical History: Past Medical History:  Diagnosis Date  . BPH (benign prostatic hyperplasia)   . Chronic diastolic CHF (congestive heart failure) ([facility]) 01/16/2018  . CVA (cerebral vascular accident) ([facility]) 1984   left side affected.    . Hypercholesterolemia     Assessment: 41 YOM admitted for abd pain. He has hx of PE, previously on xarelto, but not longer taking it per med hx. Pharmacy is consulted to start IV heparin  Noted CTA showed No acute pulmonary embolus. Minimal residual chronic thrombus in the right lower lobe pulmonary arteries, improved from exam almost 4 months ago. Baseline CBC wnl  Goal of Therapy:  Heparin level 0.3-0.7 units/ml Monitor platelets by anticoagulation protocol: Yes   Plan:  Heparin bolus 4000 units x 1 Heparin infusion 1400 units/hr F/u 6 hr heparin level at 1230 Daily heparin level and CBC F/u needs for long-term anticoagulation   Bayard Hugger, PharmD, BCPS, BCPPS Clinical Pharmacist  Pager: [phone]   05/07/2018,5:44 AM

## 2018-05-07 NOTE — Progress Notes (Signed)
ANTICOAGULATION CONSULT NOTE  Pharmacy Consult for Heparin Indication: pulmonary embolus  No Known Allergies  Patient Measurements: Height: 6\' 1"  (185.4 cm) Weight: 200 lb (90.7 kg) IBW/kg (Calculated) : 79.9 Heparin Dosing Weight: 90 kg  Vital Signs: Temp: 98.4 F (36.9 C) (09/30 1421) Temp Source: Oral (09/30 1421) BP: 128/83 (09/30 1421) Pulse Rate: 104 (09/30 1421)  Labs: Recent Labs    05/07/18 0057 05/07/18 1407  HGB 16.6  --   HCT 50.6  --   PLT 240  --   HEPARINUNFRC  --  0.22*  CREATININE 1.07  --     Estimated Creatinine Clearance: 74.7 mL/min (by C-G formula based on SCr of 1.07 mg/dL).   Medical History: Past Medical History:  Diagnosis Date  . BPH (benign prostatic hyperplasia)   . Chronic diastolic CHF (congestive heart failure) ([facility]) 01/16/2018  . CVA (cerebral vascular accident) ([facility]) 1984   left side affected.    . Hypercholesterolemia   . SBO (small bowel obstruction) ([facility]) 04/2018    Assessment: 67 YOM admitted for abd pain. He has hx of PE, previously on xarelto, but not longer taking it per med hx. Pharmacy is consulted to start IV heparin  Noted CTA showed No acute pulmonary embolus. Minimal residual chronic thrombus in the right lower lobe pulmonary arteries, improved from exam almost 4 months ago. Baseline CBC wnl  RN called, heparin not started until ~1030 AM and heparin level pushed out to 1630. However, level drawn this afternoon 2.5 hrs early, resulted at 0.22 (~3.5 hr level). Unsure how to interpret this level given timing of draw - will redraw at more appropriate time.  Goal of Therapy:  Heparin level 0.3-0.7 units/ml Monitor platelets by anticoagulation protocol: Yes   Plan:  Continue heparin infusion at 1400 units/hr for now due to heparin level drawn several hours early F/u repeat heparin level Daily heparin level and CBC F/u needs for long-term anticoagulation   Babs Bertin, PharmD, BCPS Clinical Pharmacist Clinical  phone [phone] Please check AMION for all [facility] Pharmacy contact numbers 05/07/2018 3:15 PM

## 2018-05-08 DIAGNOSIS — R269 Unspecified abnormalities of gait and mobility: Secondary | ICD-10-CM | POA: Diagnosis not present

## 2018-05-08 DIAGNOSIS — B9620 Unspecified Escherichia coli [E. coli] as the cause of diseases classified elsewhere: Secondary | ICD-10-CM | POA: Diagnosis not present

## 2018-05-08 DIAGNOSIS — E785 Hyperlipidemia, unspecified: Secondary | ICD-10-CM | POA: Diagnosis not present

## 2018-05-08 DIAGNOSIS — K56609 Unspecified intestinal obstruction, unspecified as to partial versus complete obstruction: Secondary | ICD-10-CM | POA: Diagnosis present

## 2018-05-08 DIAGNOSIS — I2782 Chronic pulmonary embolism: Secondary | ICD-10-CM | POA: Diagnosis not present

## 2018-05-08 DIAGNOSIS — Z811 Family history of alcohol abuse and dependence: Secondary | ICD-10-CM | POA: Diagnosis not present

## 2018-05-08 DIAGNOSIS — K30 Functional dyspepsia: Secondary | ICD-10-CM | POA: Diagnosis not present

## 2018-05-08 DIAGNOSIS — Z79899 Other long term (current) drug therapy: Secondary | ICD-10-CM | POA: Diagnosis not present

## 2018-05-08 DIAGNOSIS — N3000 Acute cystitis without hematuria: Secondary | ICD-10-CM | POA: Diagnosis not present

## 2018-05-08 DIAGNOSIS — I2699 Other pulmonary embolism without acute cor pulmonale: Secondary | ICD-10-CM | POA: Diagnosis not present

## 2018-05-08 DIAGNOSIS — I5032 Chronic diastolic (congestive) heart failure: Secondary | ICD-10-CM | POA: Diagnosis not present

## 2018-05-08 DIAGNOSIS — E7800 Pure hypercholesterolemia, unspecified: Secondary | ICD-10-CM | POA: Diagnosis not present

## 2018-05-08 DIAGNOSIS — Z7901 Long term (current) use of anticoagulants: Secondary | ICD-10-CM | POA: Diagnosis not present

## 2018-05-08 DIAGNOSIS — N400 Enlarged prostate without lower urinary tract symptoms: Secondary | ICD-10-CM | POA: Diagnosis not present

## 2018-05-08 DIAGNOSIS — I69354 Hemiplegia and hemiparesis following cerebral infarction affecting left non-dominant side: Secondary | ICD-10-CM | POA: Diagnosis not present

## 2018-05-08 DIAGNOSIS — N390 Urinary tract infection, site not specified: Secondary | ICD-10-CM | POA: Diagnosis not present

## 2018-05-08 DIAGNOSIS — Z791 Long term (current) use of non-steroidal anti-inflammatories (NSAID): Secondary | ICD-10-CM | POA: Diagnosis not present

## 2018-05-08 DIAGNOSIS — N3943 Post-void dribbling: Secondary | ICD-10-CM | POA: Diagnosis not present

## 2018-05-08 DIAGNOSIS — Z87891 Personal history of nicotine dependence: Secondary | ICD-10-CM | POA: Diagnosis not present

## 2018-05-08 DIAGNOSIS — R1084 Generalized abdominal pain: Secondary | ICD-10-CM | POA: Diagnosis not present

## 2018-05-08 LAB — COMPREHENSIVE METABOLIC PANEL
ALK PHOS: 85 U/L (ref 38–126)
ALT: 23 U/L (ref 0–44)
ANION GAP: 6 (ref 5–15)
AST: 20 U/L (ref 15–41)
Albumin: 3.1 g/dL — ABNORMAL LOW (ref 3.5–5.0)
BILIRUBIN TOTAL: 1.2 mg/dL (ref 0.3–1.2)
BUN: 16 mg/dL (ref 8–23)
CO2: 21 mmol/L — ABNORMAL LOW (ref 22–32)
CREATININE: 0.9 mg/dL (ref 0.61–1.24)
Calcium: 8.5 mg/dL — ABNORMAL LOW (ref 8.9–10.3)
Chloride: 109 mmol/L (ref 98–111)
GFR calc non Af Amer: >60 mL/min (ref >60)
GLUCOSE: 89 mg/dL (ref 70–99)
Potassium: 3.7 mmol/L (ref 3.5–5.1)
Sodium: 136 mmol/L (ref 135–145)
TOTAL PROTEIN: 6.5 g/dL (ref 6.5–8.1)

## 2018-05-08 LAB — HEPARIN LEVEL (UNFRACTIONATED): HEPARIN UNFRACTIONATED: 0.55 [IU]/mL (ref 0.30–0.70)

## 2018-05-08 LAB — CBC
HCT: 45.6 % (ref 39.0–52.0)
Hemoglobin: 15.1 g/dL (ref 13.0–17.0)
MCH: 29.5 pg (ref 26.0–34.0)
MCHC: 33.1 g/dL (ref 30.0–36.0)
MCV: 89.2 fL (ref 78.0–100.0)
PLATELETS: 185 10*3/uL (ref 150–400)
RBC: 5.11 MIL/uL (ref 4.22–5.81)
RDW: 14 % (ref 11.5–15.5)
WBC: 16.4 10*3/uL — ABNORMAL HIGH (ref 4.0–10.5)

## 2018-05-08 MED ORDER — RIVAROXABAN 20 MG PO TABS
20.0000 mg | ORAL_TABLET | Freq: Every day | ORAL | Status: DC
  Administered 2018-05-08 – 2018-05-09 (×2): 20 mg via ORAL
  Filled 2018-05-08 (×3): qty 1

## 2018-05-08 MED ORDER — SODIUM CHLORIDE 0.9 % IV SOLN
INTRAVENOUS | Status: DC | PRN
  Administered 2018-05-08: 1000 mL via INTRAVENOUS

## 2018-05-08 NOTE — Progress Notes (Signed)
ANTICOAGULATION CONSULT NOTE  Pharmacy Consult for Heparin Indication: pulmonary embolus  No Known Allergies  Patient Measurements: Height: 6\' 1"  (185.4 cm) Weight: 200 lb (90.7 kg) IBW/kg (Calculated) : 79.9 Heparin Dosing Weight: 90 kg  Vital Signs: Temp: 98.3 F (36.8 C) (10/01 0605) Temp Source: Oral (10/01 0605) BP: 116/84 (10/01 0605) Pulse Rate: 82 (10/01 0605)  Labs: Recent Labs    05/07/18 0057 05/07/18 1407 05/07/18 1903 05/08/18 0609  HGB 16.6  --   --  15.1  HCT 50.6  --   --  45.6  PLT 240  --   --  185  HEPARINUNFRC  --  0.22* <0.10* 0.55  CREATININE 1.07  --   --   --     Estimated Creatinine Clearance: 74.7 mL/min (by C-G formula based on SCr of 1.07 mg/dL).   Medical History: Past Medical History:  Diagnosis Date  . BPH (benign prostatic hyperplasia)   . Chronic diastolic CHF (congestive heart failure) ([facility]) 01/16/2018  . CVA (cerebral vascular accident) ([facility]) 1984   left side affected.    . Hypercholesterolemia   . SBO (small bowel obstruction) ([facility]) 04/2018    Assessment: 67 YOM admitted for abd pain. He has hx of PE, previously on xarelto, but not longer taking it per med hx. Pharmacy is consulted to start IV heparin at CTA shows chronic thrombus.  Heparin level now therapeutic 0.55 units/ml Goal of Therapy:  Heparin level 0.3-0.7 units/ml Monitor platelets by anticoagulation protocol: Yes   Plan:  -Continue heparin infusion at 1700 units/r -Recheck 6hr heparin level  Thanks for allowing pharmacy to be a part of this patient's care.  Talbert Cage, PharmD Clinical Pharmacist

## 2018-05-08 NOTE — Progress Notes (Addendum)
Progress Note    Jason Myers  WUJ:[id number] DOB: [birth year]  DOA: 05/07/2018 PCP: Gean Birchwood, [facility]    Brief Narrative:    Medical records reviewed and are as summarized below:  Jason Myers is an 67 y.o. male with medical history significant of HLD; hemorrhagic CVA s/p ruptured aneurysm (1984); chronic diastolic CHF; BPH; and hospitalization for acute PE (no longer on [facility]) and ileitis in 6/19 presenting with abdominal pain.  He reports that he was here with a PE and SBO in June.  He stopped taking anticoagulation after hospital discharge due to "bad teeth that need to be extracted."  Yesterday, he went to bed early but had difficulty sleeping.  He had abdominal pressure and indigestion.  He also noticed difficulty urinating - he had hesitancy and then post-void dribbling and he sailed his pants.  He noticed indigestion like when he had his prior SBO, but no n/v.  He has not previously had abdominal surgery.  His last BM was Sunday and was unremarkable.  He has not had fever.  Assessment/Plan:   Principal Problem:   Abdominal pain Active Problems:   HLD (hyperlipidemia)   PE (pulmonary thromboembolism) (HCC)   Chronic diastolic CHF (congestive heart failure) (HCC)   Acute lower UTI   SBO (small bowel obstruction) (HCC)  Abdominal pain due to UTI -Patient presenting with acute onset of abdominal pain with indigestion -Also with urinary symptoms at around the same time -Clearly abnormal UA and symptoms c/w UTI --Urine culture: e coli -Treat with IV Rocephin for presumed urinary source -HIV negative in 6/19 -on CT Scan has prostate enlargement-- on flomax-- urology follow up?  NO SBO -He denies abdominal pain or n/v -general surgery saw and has signed off NG tube removed   PE -+ PE in 4/18 and 6/19  -He should need lifelong AC -He stopped taking AC right after leaving the hospital last time -He is now aware that he may have significant morbidity and mortality if he  is not on lifelong AC -since no surgery, can change back to xarelto  Chronic diastolic CHF -01/20/18 echo with preserved EF and grade 1 diastolic dysfunction -He appears to be compensated at this time -He is not taking either BB or ACE and would likely benefit from addition of one or both of these medications at some point  HLD -Continue Crestor  leukocytosis -tending down -due to UTI  Occasional alcohol use -monitor for withdrawal symptoms  Needs continued care in hospital for IV abx for infection as still advancing diet and do not think he will tolerate PO antibiotics.   Family Communication/Anticipated D/C date and plan/Code Status   DVT prophylaxis: xarelto Code Status: Full Code.  Family Communication:  Disposition Plan: pending   Medical Consultants:    General surgery     Subjective:   Asking for advancement of diet  Objective:    Vitals:   05/07/18 0654 05/07/18 1421 05/07/18 2140 05/08/18 0605  BP: 121/86 128/83 113/75 116/84  Pulse: 100 (!) 104 88 82  Resp: 18 (!) 24 16 16   Temp: 98.9 F (37.2 C) 98.4 F (36.9 C) 98.7 F (37.1 C) 98.3 F (36.8 C)  TempSrc: Oral Oral Oral Oral  SpO2: 97% 96% 94% 96%  Weight:      Height:        Intake/Output Summary (Last 24 hours) at 05/08/2018 1050 Last data filed at 05/08/2018 0636 Gross per 24 hour  Intake 2019.67 ml  Output 1475  ml  Net 544.67 ml   Filed Weights   05/07/18 0037  Weight: 90.7 kg    Exam: In bed, disheveled appearing rrr No rashes or lesions Alert and oriented Odd affect but appropriate  Data Reviewed:   I have personally reviewed following labs and imaging studies:  Labs: Labs show the following:   Basic Metabolic Panel: Recent Labs  Lab 05/07/18 0057 05/08/18 0609  NA 138 136  K 3.7 3.7  CL 107 109  CO2 21* 21*  GLUCOSE 129* 89  BUN 22 16  CREATININE 1.07 0.90  CALCIUM 9.0 8.5*   GFR Estimated Creatinine Clearance: 88.8 mL/min (by C-G formula based on  SCr of 0.9 mg/dL). Liver Function Tests: Recent Labs  Lab 05/07/18 0057 05/08/18 0609  AST 26 20  ALT 24 23  ALKPHOS 74 85  BILITOT 1.5* 1.2  PROT 7.1 6.5  ALBUMIN 4.1 3.1*   Recent Labs  Lab 05/07/18 0057  LIPASE 29   No results for input(s): AMMONIA in the last 168 hours. Coagulation profile No results for input(s): INR, PROTIME in the last 168 hours.  CBC: Recent Labs  Lab 05/07/18 0057 05/08/18 0609  WBC 19.6* 16.4*  HGB 16.6 15.1  HCT 50.6 45.6  MCV 89.6 89.2  PLT 240 185   Cardiac Enzymes: No results for input(s): CKTOTAL, CKMB, CKMBINDEX, TROPONINI in the last 168 hours. BNP (last 3 results) No results for input(s): PROBNP in the last 8760 hours. CBG: No results for input(s): GLUCAP in the last 168 hours. D-Dimer: No results for input(s): DDIMER in the last 72 hours. Hgb A1c: No results for input(s): HGBA1C in the last 72 hours. Lipid Profile: No results for input(s): CHOL, HDL, LDLCALC, TRIG, CHOLHDL, LDLDIRECT in the last 72 hours. Thyroid function studies: No results for input(s): TSH, T4TOTAL, T3FREE, THYROIDAB in the last 72 hours.  Invalid input(s): FREET3 Anemia work up: No results for input(s): VITAMINB12, FOLATE, FERRITIN, TIBC, IRON, RETICCTPCT in the last 72 hours. Sepsis Labs: Recent Labs  Lab 05/07/18 0057 05/07/18 0105 05/08/18 0609  WBC 19.6*  --  16.4*  LATICACIDVEN  --  1.02  --     Microbiology Recent Results (from the past 240 hour(s))  Urine culture     Status: Abnormal (Preliminary result)   Collection Time: 05/07/18  3:06 AM  Result Value Ref Range Status   Specimen Description URINE, CLEAN CATCH  Final   Special Requests ADDED 0533 TO STERILE CONTAINER  Final   Culture (A)  Final    >=100,000 COLONIES/mL ESCHERICHIA COLI SUSCEPTIBILITIES TO FOLLOW Performed at [facility], [address], [place], [place] [address]    Report Status PENDING  Incomplete  Culture, blood (routine x 2)     Status: None  (Preliminary result)   Collection Time: 05/07/18  2:15 PM  Result Value Ref Range Status   Specimen Description BLOOD RIGHT HAND  Final   Special Requests   Final    BOTTLES DRAWN AEROBIC AND ANAEROBIC Blood Culture adequate volume   Culture   Final    NO GROWTH < 24 HOURS Performed at [facility], [address] [place], [place] [address]    Report Status PENDING  Incomplete  Culture, blood (routine x 2)     Status: None (Preliminary result)   Collection Time: 05/07/18  2:27 PM  Result Value Ref Range Status   Specimen Description BLOOD RIGHT HAND  Final   Special Requests   Final  BOTTLES DRAWN AEROBIC AND ANAEROBIC Blood Culture adequate volume   Culture   Final    NO GROWTH < 24 HOURS Performed at [facility], [address], [place], [place] [address]    Report Status PENDING  Incomplete    Procedures and diagnostic studies:  Dg Abd 1 View  Result Date: 05/07/2018 CLINICAL DATA:  Nasogastric tube placement EXAM: ABDOMEN - 1 VIEW COMPARISON:  CT from earlier today FINDINGS: Nasogastric tube tip the level of the proximal duodenum. The stomach is decompressed. Nonobstructive bowel gas pattern. Contrast in the partially distended bladder from recent enhanced CT. IMPRESSION: 1. Nasogastric tube tip at the proximal duodenum. 2. Nonobstructive bowel gas pattern. Electronically Signed   By: Marnee Spring M.D.   On: 05/07/2018 10:00   Ct Angio Chest Pe W And/or Wo Contrast  Result Date: 05/07/2018 CLINICAL DATA:  PE suspected, high pretest prob; Abd pain, acute, generalized. Vomiting. EXAM: CT ANGIOGRAPHY CHEST CT ABDOMEN AND PELVIS WITH CONTRAST TECHNIQUE: Multidetector CT imaging of the chest was performed using the standard protocol during bolus administration of intravenous contrast. Multiplanar CT image reconstructions and MIPs were obtained to evaluate the vascular anatomy. Multidetector CT imaging of the abdomen and pelvis was performed using the standard  protocol during bolus administration of intravenous contrast. Delayed acquisition of the abdomen pelvis portion of the exam due to vomiting after chest CT. CONTRAST:  ISOVUE-370 IOPAMIDOL (ISOVUE-370) INJECTION 76% COMPARISON:  CT 01/09/2018. FINDINGS: CTA CHEST FINDINGS Cardiovascular: No new filling defects within the pulmonary arteries to suggest acute pulmonary embolus. Mild residual eccentric wall thickening/chronic thrombus in the segmental branches in the right lower lobe, improved from prior exam. Thoracic aorta is normal in caliber. Heart is normal in size. No pericardial effusion. Mediastinum/Nodes: No enlarged mediastinal or hilar lymph nodes. Enlarged right lobe of the thyroid gland with hypodense nodule, previously biopsied 11/15/2016. The esophagus is nondistended. Lungs/Pleura: Breathing motion artifact partially obscures evaluation. Slight heterogeneity of parenchyma in the upper lobes, similar to prior exam and may be post infectious or inflammatory. Mild right lower lobe scarring. Scattered atelectasis throughout both lungs. No confluent airspace disease. No evidence pulmonary edema. No pleural effusion. Musculoskeletal: There are no acute or suspicious osseous abnormalities. Review of the MIP images confirms the above findings. CT ABDOMEN and PELVIS FINDINGS Hepatobiliary: No focal liver abnormality is seen. No gallstones, gallbladder wall thickening, or biliary dilatation. Pancreas: No ductal dilatation or inflammation. Spleen: Normal in size without focal abnormality. Adrenals/Urinary Tract: No adrenal nodule. No hydronephrosis. No perinephric edema. Multiple bilateral renal cysts, including a dominant cyst in the upper left kidney spanning 10.2 cm. No suspicious solid lesion. Urinary bladder is partially distended without wall thickening. Stomach/Bowel: Stomach is nondistended. No gastric wall thickening. No evidence of bowel obstruction. Fluid-filled normal caliber small bowel in the  lower abdomen without wall thickening or inflammatory change. Colonic diverticulosis of the distal colon without diverticulitis. Normal appendix. Vascular/Lymphatic: No acute vascular finding. Mild aortic atherosclerosis. 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 No enlarged abdominopelvic lymph nodes. Reproductive: Enlarged prostate gland spans 5.9 cm. Central prostatic calcifications. Other: Trace free fluid in the pelvis. No free air. No intra-abdominal abscess. Musculoskeletal: Prominent facet arthropathy in the lower lumbar spine. There are no acute or suspicious osseous abnormalities. Review of the MIP images confirms the above findings. IMPRESSION: 1. No acute pulmonary embolus. Minimal residual chronic thrombus in the right lower lobe pulmonary arteries, improved from exam almost 4 months  ago. 2. Scattered atelectasis without other acute chest finding. 3. Fluid-filled nondilated small bowel in the lower abdomen, may represent enteritis or ileus. Very early small bowel obstruction is also considered given similar appearance on prior exam. 4. Incidental findings of colonic diverticulosis, bilateral renal cysts, enlarged prostate gland, and Aortic Atherosclerosis (ICD10-I70.0). Electronically Signed   By: Narda Rutherford M.D.   On: 05/07/2018 04:58   Ct Abdomen Pelvis W Contrast  Result Date: 05/07/2018 CLINICAL DATA:  PE suspected, high pretest prob; Abd pain, acute, generalized. Vomiting. EXAM: CT ANGIOGRAPHY CHEST CT ABDOMEN AND PELVIS WITH CONTRAST TECHNIQUE: Multidetector CT imaging of the chest was performed using the standard protocol during bolus administration of intravenous contrast. Multiplanar CT image reconstructions and MIPs were obtained to evaluate the vascular anatomy. Multidetector CT imaging of the abdomen and pelvis was performed using the standard protocol during bolus administration of intravenous contrast. Delayed acquisition of the abdomen  pelvis portion of the exam due to vomiting after chest CT. CONTRAST:  ISOVUE-370 IOPAMIDOL (ISOVUE-370) INJECTION 76% COMPARISON:  CT 01/09/2018. FINDINGS: CTA CHEST FINDINGS Cardiovascular: No new filling defects within the pulmonary arteries to suggest acute pulmonary embolus. Mild residual eccentric wall thickening/chronic thrombus in the segmental branches in the right lower lobe, improved from prior exam. Thoracic aorta is normal in caliber. Heart is normal in size. No pericardial effusion. Mediastinum/Nodes: No enlarged mediastinal or hilar lymph nodes. Enlarged right lobe of the thyroid gland with hypodense nodule, previously biopsied 11/15/2016. The esophagus is nondistended. Lungs/Pleura: Breathing motion artifact partially obscures evaluation. Slight heterogeneity of parenchyma in the upper lobes, similar to prior exam and may be post infectious or inflammatory. Mild right lower lobe scarring. Scattered atelectasis throughout both lungs. No confluent airspace disease. No evidence pulmonary edema. No pleural effusion. Musculoskeletal: There are no acute or suspicious osseous abnormalities. Review of the MIP images confirms the above findings. CT ABDOMEN and PELVIS FINDINGS Hepatobiliary: No focal liver abnormality is seen. No gallstones, gallbladder wall thickening, or biliary dilatation. Pancreas: No ductal dilatation or inflammation. Spleen: Normal in size without focal abnormality. Adrenals/Urinary Tract: No adrenal nodule. No hydronephrosis. No perinephric edema. Multiple bilateral renal cysts, including a dominant cyst in the upper left kidney spanning 10.2 cm. No suspicious solid lesion. Urinary bladder is partially distended without wall thickening. Stomach/Bowel: Stomach is nondistended. No gastric wall thickening. No evidence of bowel obstruction. Fluid-filled normal caliber small bowel in the lower abdomen without wall thickening or inflammatory change. Colonic diverticulosis of the distal  colon without diverticulitis. Normal appendix. Vascular/Lymphatic: No acute vascular finding. Mild aortic atherosclerosis. 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 No enlarged abdominopelvic lymph nodes. Reproductive: Enlarged prostate gland spans 5.9 cm. Central prostatic calcifications. Other: Trace free fluid in the pelvis. No free air. No intra-abdominal abscess. Musculoskeletal: Prominent facet arthropathy in the lower lumbar spine. There are no acute or suspicious osseous abnormalities. Review of the MIP images confirms the above findings. IMPRESSION: 1. No acute pulmonary embolus. Minimal residual chronic thrombus in the right lower lobe pulmonary arteries, improved from exam almost 4 months ago. 2. Scattered atelectasis without other acute chest finding. 3. Fluid-filled nondilated small bowel in the lower abdomen, may represent enteritis or ileus. Very early small bowel obstruction is also considered given similar appearance on prior exam. 4. Incidental findings of colonic diverticulosis, bilateral renal cysts, enlarged prostate gland, and Aortic Atherosclerosis (ICD10-I70.0). Electronically Signed   By: Narda Rutherford M.D.   On: 05/07/2018 04:58  Medications:   . rivaroxaban  20 mg Oral Q supper  . rosuvastatin  10 mg Oral Daily  . tamsulosin  0.4 mg Oral QHS   Continuous Infusions: . sodium chloride Stopped (05/08/18 0630)  . cefTRIAXone (ROCEPHIN)  IV Stopped (05/08/18 0618)     LOS: 0 days   Joseph Art  [facility]   *Please refer to [email or web address], password [email or web address] to get updated schedule on who will round on this patient, as hospitalists switch teams weekly. If 7PM-7AM, please contact night-coverage at [email or web address], password [email or web address] for any overnight needs.  05/08/2018, 10:50 AM

## 2018-05-08 NOTE — Evaluation (Signed)
Physical Therapy Evaluation Patient Details Name: Jason Myers MRN: [id number] DOB: 11/04/1950 Today's Date: 05/08/2018   History of Present Illness  Pt is a 67 y/o male admitted secondary to abdominal pain possibly secondary to bilary colic or indigestion. 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 PMH includes CVA with L hemiplegia, CHF, and PE.   Clinical Impression  Pt admitted secondary to problem above with deficits below. Pt unsteady during gait requiring min to mod A and presenting with 1 LOB. Pt currently lives alone and feel like he is at increased risk for falls, however, pt will likely refuse SNF. Educated about use of WC to increase safety with mobility. Will continue to follow acutely to maximize functional mobility independence and safety.     Follow Up Recommendations SNF;Supervision for mobility/OOB(if pt refuses, will need HHPT )    Equipment Recommendations  3in1 (PT)    Recommendations for Other Services OT consult     Precautions / Restrictions Precautions Precautions: Fall Restrictions Weight Bearing Restrictions: No      Mobility  Bed Mobility Overal bed mobility: Needs Assistance Bed Mobility: Supine to Sit     Supine to sit: Supervision     General bed mobility comments: supervision and increased time for safety.   Transfers Overall transfer level: Needs assistance Equipment used: None Transfers: Sit to/from Stand Sit to Stand: Min assist         General transfer comment: Min A for steadying assist.   Ambulation/Gait Ambulation/Gait assistance: Min assist;Mod assist Gait Distance (Feet): 50 Feet Assistive device: None Gait Pattern/deviations: Step-to pattern;Decreased dorsiflexion - left;Decreased step length - right;Decreased step length - left Gait velocity: Decresaed    General Gait Details: Slow, unsteady gait. Pt with hyperextension in L knee when in stance phase. Pt requiring  consistent min A For steadying, however, pt with 1 LOB requiring mod A for steadying. Pt reaching out for rail in hall. Educated about importance of using cane when ambulating.   Stairs            Wheelchair Mobility    Modified Rankin (Stroke Patients Only)       Balance Overall balance assessment: Needs assistance Sitting-balance support: No upper extremity supported;Feet supported Sitting balance-Leahy Scale: Good     Standing balance support: Single extremity supported;No upper extremity supported;During functional activity Standing balance-Leahy Scale: Poor Standing balance comment: Reliant on external support                              Pertinent Vitals/Pain Pain Assessment: No/denies pain    Home Living Family/patient expects to be discharged to:: Private residence Living Arrangements: Alone Available Help at Discharge: Friend(s);Available PRN/intermittently Type of Home: Apartment(public housing ) Home Access: Elevator     Home Layout: One level Home Equipment: Cane - single point;Grab bars - tub/shower;Grab bars - toilet;Wheelchair - manual      Prior Function Level of Independence: Needs assistance   Gait / Transfers Assistance Needed: limited community ambulator, reports occasional use of cane   ADL's / Homemaking Assistance Needed: independent with ADLS, has aide 2x/wk to assist with iADLs        Hand Dominance   Dominant Hand: Right    Extremity/Trunk Assessment   Upper Extremity Assessment Upper Extremity Assessment: LUE deficits/detail LUE Deficits / Details: LUE hemiplegia at baseline     Lower Extremity Assessment Lower Extremity Assessment: LLE deficits/detail LLE  Deficits / Details: LLE hemiplegia at baseline     Cervical / Trunk Assessment Cervical / Trunk Assessment: Kyphotic  Communication   Communication: No difficulties  Cognition Arousal/Alertness: Awake/alert Behavior During Therapy: WFL for tasks  assessed/performed Overall Cognitive Status: History of cognitive impairments - at baseline                                        General Comments General comments (skin integrity, edema, etc.): Educated about SNF, however, pt will likely refuse.     Exercises     Assessment/Plan    PT Assessment Patient needs continued PT services  PT Problem List Decreased strength;Decreased balance;Decreased mobility;Decreased coordination;Decreased knowledge of use of DME;Decreased safety awareness;Decreased knowledge of precautions       PT Treatment Interventions Gait training;DME instruction;Stair training;Functional mobility training;Therapeutic activities;Therapeutic exercise;Balance training;Patient/family education    PT Goals (Current goals can be found in the Care Plan section)  Acute Rehab PT Goals Patient Stated Goal: to go home  PT Goal Formulation: With patient Time For Goal Achievement: 05/22/18 Potential to Achieve Goals: Good    Frequency Min 3X/week   Barriers to discharge Decreased caregiver support      Co-evaluation               AM-PAC PT "6 Clicks" Daily Activity  Outcome Measure Difficulty turning over in bed (including adjusting bedclothes, sheets and blankets)?: A Little Difficulty moving from lying on back to sitting on the side of the bed? : A Little Difficulty sitting down on and standing up from a chair with arms (e.g., wheelchair, bedside commode, etc,.)?: Unable Help needed moving to and from a bed to chair (including a wheelchair)?: A Little Help needed walking in hospital room?: A Lot Help needed climbing 3-5 steps with a railing? : A Lot 6 Click Score: 14    End of Session Equipment Utilized During Treatment: Gait belt Activity Tolerance: Patient tolerated treatment well Patient left: in chair;with call bell/phone within reach Nurse Communication: Mobility status PT Visit Diagnosis: Unsteadiness on feet (R26.81);Repeated  falls (R29.6);Hemiplegia and hemiparesis Hemiplegia - Right/Left: Left Hemiplegia - dominant/non-dominant: Non-dominant Hemiplegia - caused by: Cerebral infarction    Time: 1610-9604 PT Time Calculation (min) (ACUTE ONLY): 28 min   Charges:   PT Evaluation $PT Eval Moderate Complexity: 1 Mod PT Treatments $Gait Training: 8-22 mins        Gladys Damme, PT, DPT  Acute Rehabilitation Services  Pager: [phone] Office: [phone]   Lehman Prom 05/08/2018, 12:56 PM

## 2018-05-08 NOTE — Social Work (Signed)
CSW acknowledging consult for SNF placement, per notes pt has been to SNF before. Pt managed by [facility], will follow up to see when pt discharged from [facility] in order to determine copay information if applicable. Will only complete referral with pt's permission.  Doy Hutching, LCSWA [facility] Clinical Social Work [phone]

## 2018-05-08 NOTE — Plan of Care (Signed)
  Problem: Pain Managment: Goal: General experience of comfort will improve Outcome: Progressing   Problem: Safety: Goal: Ability to remain free from injury will improve Outcome: Progressing   Problem: Skin Integrity: Goal: Risk for impaired skin integrity will decrease Outcome: Progressing   

## 2018-05-09 DIAGNOSIS — N3000 Acute cystitis without hematuria: Secondary | ICD-10-CM

## 2018-05-09 DIAGNOSIS — N390 Urinary tract infection, site not specified: Secondary | ICD-10-CM | POA: Diagnosis not present

## 2018-05-09 DIAGNOSIS — R1084 Generalized abdominal pain: Secondary | ICD-10-CM

## 2018-05-09 DIAGNOSIS — K56609 Unspecified intestinal obstruction, unspecified as to partial versus complete obstruction: Secondary | ICD-10-CM | POA: Diagnosis not present

## 2018-05-09 LAB — CBC
HCT: 47.4 % (ref 39.0–52.0)
Hemoglobin: 15.8 g/dL (ref 13.0–17.0)
MCH: 29.4 pg (ref 26.0–34.0)
MCHC: 33.3 g/dL (ref 30.0–36.0)
MCV: 88.3 fL (ref 78.0–100.0)
PLATELETS: 189 10*3/uL (ref 150–400)
RBC: 5.37 MIL/uL (ref 4.22–5.81)
RDW: 14.1 % (ref 11.5–15.5)
WBC: 7.8 10*3/uL (ref 4.0–10.5)

## 2018-05-09 LAB — BASIC METABOLIC PANEL
ANION GAP: 8 (ref 5–15)
BUN: 12 mg/dL (ref 8–23)
CO2: 22 mmol/L (ref 22–32)
Calcium: 9 mg/dL (ref 8.9–10.3)
Chloride: 108 mmol/L (ref 98–111)
Creatinine, Ser: 0.78 mg/dL (ref 0.61–1.24)
Glucose, Bld: 96 mg/dL (ref 70–99)
POTASSIUM: 4.2 mmol/L (ref 3.5–5.1)
SODIUM: 138 mmol/L (ref 135–145)

## 2018-05-09 LAB — URINE CULTURE: Culture: >=100000 — AB

## 2018-05-09 NOTE — Care Management Note (Addendum)
Case Management Note  Patient Details  Name: Jason Myers MRN: [id number] Date of Birth: 06/18/1951  Subjective/Objective:                    Action/Plan:Tammy with [facility] accepted referral.  Home health Orders received referral given to Tammy with [facility]. Patient refusing 3 in 1 .   Spoke at length regarding PT recommendation of SNF for short term rehab. Patient just recently went to [facility] and does not feel that he needs SNF at this time. He lives in apartment building where "everyone takes care of everyone". Confirmed face sheet information with patient. Patient has cane and wheel chair already , does not want 3 in 1 .   He is agreeable to home health through [facility]. He has an aide every Monday for 1.5 hours and every Friday for 2 hours.   At discharge he is requesting bus or cab voucher home. He has no one who can pick him up .   Paged DR Margo Aye to notify her and requested home health orders and face to face Expected Discharge Date:                  Expected Discharge Plan:  Home w Home Health Services  In-House Referral:  Clinical Social Work  Discharge planning Services  CM Consult  Post Acute Care Choice:  Home Health Choice offered to:  Patient  DME Arranged:    DME Agency:     HH Arranged:    HH Agency:     Status of Service:  In process, will continue to follow  If discussed at Long Length of Stay Meetings, dates discussed:    Additional Comments:  Kingsley Plan, RN 05/09/2018, 10:23 AM

## 2018-05-09 NOTE — Social Work (Signed)
Pt declining SNF at discharge per RN Case Manager. Pt will need bus pass or cab voucher at discharge. Will leave hand off so that whichever CSW is covering pt at discharge will be informed.   Doy Hutching, LCSWA [facility] Clinical Social Work [phone]

## 2018-05-09 NOTE — Progress Notes (Signed)
Physical Therapy Treatment Patient Details Name: Jason Myers MRN: [id number] DOB: 1951-01-14 Today's Date: 05/09/2018    History of Present Illness Pt is a 67 y/o male admitted secondary to abdominal pain possibly secondary to bilary colic or indigestion. 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 PMH includes CVA with L hemiplegia, CHF, and PE.     PT Comments    Pt performed gait training, he reports he has adapted his mobility to his impairments and he relies on the environment to mobilize.  Re-educated patient again on the importance of an AD to manage his balance as he could reach for an object that is not stable and fall.  Pt although polite appears unrecpetive to correcting his safety behaviors.  He is adamant to return home at this time.     Follow Up Recommendations  SNF;Supervision for mobility/OOB(He will likely refuse and require HHPT.  )     Equipment Recommendations  3in1 (PT)    Recommendations for Other Services OT consult     Precautions / Restrictions Precautions Precautions: Fall Restrictions Weight Bearing Restrictions: No    Mobility  Bed Mobility Overal bed mobility: Needs Assistance Bed Mobility: Supine to Sit     Supine to sit: Supervision     General bed mobility comments: Pt seated in chair on arrival.    Transfers Overall transfer level: Needs assistance Equipment used: None Transfers: Sit to/from Stand Sit to Stand: Min guard;Min assist         General transfer comment: Pt able to initiate movement into standing but requires assistance as he is not balanced without holding on to something.  Pt flexed at waist to reach for bed rail.    Ambulation/Gait Ambulation/Gait assistance: Mod assist;Min guard(when pushing IV pole he is min guard, without a furniture crusing he is moderate assistance.  ) Gait Distance (Feet): 200 Feet Assistive device: IV Pole(much steadier pushing IV pole) Gait  Pattern/deviations: Step-to pattern;Decreased dorsiflexion - left;Decreased step length - right;Decreased step length - left Gait velocity: Decresaed    General Gait Details: Slow, unsteady gait. Pt with hyperextension in L knee when in stance phase. Pt requiring consistent min -mod A For steadying Pt reaching out for rail in hall. Educated about importance of using cane when ambulating. Pt does not appear to recpetive to using a device at home as he reports if he uses his cane he can't hold his book.   Pt unrealistic in regards to safety.  Instructed patient to push IV pole for support he rested his forearm on pump and balance muxh improved.     Stairs             Wheelchair Mobility    Modified Rankin (Stroke Patients Only)       Balance Overall balance assessment: Needs assistance Sitting-balance support: No upper extremity supported;Feet supported Sitting balance-Leahy Scale: Good     Standing balance support: Single extremity supported;No upper extremity supported;During functional activity Standing balance-Leahy Scale: Fair Standing balance comment: Reliant on external support                             Cognition Arousal/Alertness: Awake/alert Behavior During Therapy: WFL for tasks assessed/performed Overall Cognitive Status: History of cognitive impairments - at baseline  Exercises Total Joint Exercises Long Arc Quad: AROM;Both;10 reps;Seated Marching in Standing: AROM;Both;10 reps;Seated;Limitations Marching in Standing Limitations: LLE with compensatory movements and also compensates with movement of trunk when performing on L side.   Other Exercises Other Exercises: Repeated sit to stands x10 reps without UE support.      General Comments        Pertinent Vitals/Pain Pain Assessment: Faces Faces Pain Scale: Hurts a little bit Pain Location: abdomen Pain Descriptors / Indicators:  Aching Pain Intervention(s): Monitored during session;Repositioned    Home Living Family/patient expects to be discharged to:: Private residence Living Arrangements: Alone Available Help at Discharge: Friend(s);Available PRN/intermittently Type of Home: Apartment(public housing ) Home Access: Elevator   Home Layout: One level Home Equipment: Cane - single point;Grab bars - tub/shower;Grab bars - toilet;Wheelchair - manual Additional Comments: rarely used Chiropodist bed;brother lives close by, but does not provide much assistance    Prior Function Level of Independence: Needs assistance  Gait / Transfers Assistance Needed: limited community ambulator, reports occasional use of cane  ADL's / Homemaking Assistance Needed: independent with ADLS, has aide 2x/wk to assist with iADLs 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 : A Little Difficulty sitting down on and standing up from a chair with arms (e.g., wheelchair, bedside commode, etc,.)?: Unable Help needed moving to and from a bed to chair (including a wheelchair)?: A Little Help needed walking in hospital room?: A Lot Help needed climbing 3-5 steps with a railing? : A Lot 6 Click Score: 14    End of Session Equipment Utilized During Treatment: Gait belt Activity Tolerance: Patient tolerated treatment well Patient left: in chair;with call bell/phone within reach Nurse  Communication: Mobility status PT Visit Diagnosis: Unsteadiness on feet (R26.81);Repeated falls (R29.6);Hemiplegia and hemiparesis Hemiplegia - Right/Left: Left Hemiplegia - dominant/non-dominant: Non-dominant Hemiplegia - caused by: Cerebral infarction     Time: 1610-9604 PT Time Calculation (min) (ACUTE ONLY): 28 min  Charges:  $Gait Training: 8-22 mins $Therapeutic Exercise: 8-22 mins                     Joycelyn Rua, PTA Acute Rehabilitation Services Pager [phone] Office [phone]     Julietta Batterman Artis Delay 05/09/2018, 4:10 PM

## 2018-05-09 NOTE — Progress Notes (Addendum)
PROGRESS NOTE  Jason Myers ZOX:[id number] DOB: 1950-11-25 DOA: 05/07/2018 PCP: Gean Birchwood, [facility]  HPI/Recap of past 24 hours: Jason Myers is an 67 y.o. male with medical history significant ofHLD; hemorrhagic CVA s/p ruptured aneurysm (1984); chronic diastolic CHF; BPH; and hospitalization for acute PE (no longer on [facility]) and ileitis in 6/19 presenting with abdominal pain.He reports that he was here with a PE and SBO in June. He stopped taking anticoagulation after hospital discharge due to "bad teeth that need to be extracted." Yesterday, he went to bed early but had difficulty sleeping. He had abdominal pressure and indigestion. He also noticed difficulty urinating - he had hesitancy and then post-void dribbling and he sailed his pants. He noticed indigestion like when he had his prior SBO, but no n/v. He has not previously had abdominal surgery. His last BM was Sunday and was unremarkable. He has not had fever.  05/09/18: Pt seen and examined at his bedside.  States his symptoms are improving.  Had a bowel movement yesterday and this morning.  Assessment/Plan: Principal Problem:   Abdominal pain Active Problems:   HLD (hyperlipidemia)   PE (pulmonary thromboembolism) (HCC)   Chronic diastolic CHF (congestive heart failure) (HCC)   Acute lower UTI   SBO (small bowel obstruction) (HCC)  Abdominal pain secondary to UTI Improving; has bowel movements; has good bowel sounds. SBO ruled out Continue IV Rocephin Monitor urine output Monitor fever curve and WBC Obtain CBC in the morning  UTI Management as stated above Urine culture grew greater than 100,000 E. coli pansensitive Continue Rocephin Blood cultures x2- to date  BPH Follow-up with urology outpatient  History of recurrent PE Continue Xarelto  History of hemorrhagic CVA Left upper extremity weakness  Ambulatory dysfunction PT recommend SNF Fall precautions   Code Status: Full code  Family  Communication: None at bedside  Disposition Plan: Home possibly tomorrow 05/10/2018   Consultants:  None  Procedures:  None  Antimicrobials:  IV Rocephin  DVT prophylaxis: Xarelto   Objective: Vitals:   05/08/18 1417 05/08/18 2151 05/09/18 0616 05/09/18 1341  BP: 107/68 118/87 107/73 (!) 130/93  Pulse: 95 89 77 84  Resp: 16 18 16 18   Temp: 98.6 F (37 C) 99.1 F (37.3 C) 98.4 F (36.9 C) 98.2 F (36.8 C)  TempSrc: Oral Oral Oral Oral  SpO2: 98% 93% 98% 99%  Weight:      Height:        Intake/Output Summary (Last 24 hours) at 05/09/2018 1812 Last data filed at 05/09/2018 1432 Gross per 24 hour  Intake 818 ml  Output 950 ml  Net -132 ml   Filed Weights   05/07/18 0037  Weight: 90.7 kg    Exam:  . General: 67 y.o. year-old male well developed well nourished in no acute distress.  Alert and oriented x3. . Cardiovascular: Regular rate and rhythm with no rubs or gallops.  No thyromegaly or JVD noted.   Marland Kitchen Respiratory: Clear to auscultation with no wheezes or rales. Good inspiratory effort. . Abdomen: Soft nontender nondistended with normal bowel sounds x4 quadrants. . Musculoskeletal: No lower extremity edema. 2/4 pulses in all 4 extremities.  Left upper extremity contracted. Marland Kitchen Psychiatry: Mood is appropriate for condition and setting   Data Reviewed: CBC: Recent Labs  Lab 05/07/18 0057 05/08/18 0609 05/09/18 0520  WBC 19.6* 16.4* 7.8  HGB 16.6 15.1 15.8  HCT 50.6 45.6 47.4  MCV 89.6 89.2 88.3  PLT 240 185 189  Basic Metabolic Panel: Recent Labs  Lab 05/07/18 0057 05/08/18 0609 05/09/18 0520  NA 138 136 138  K 3.7 3.7 4.2  CL 107 109 108  CO2 21* 21* 22  GLUCOSE 129* 89 96  BUN 22 16 12   CREATININE 1.07 0.90 0.78  CALCIUM 9.0 8.5* 9.0   GFR: Estimated Creatinine Clearance: 99.9 mL/min (by C-G formula based on SCr of 0.78 mg/dL). Liver Function Tests: Recent Labs  Lab 05/07/18 0057 05/08/18 0609  AST 26 20  ALT 24 23  ALKPHOS 74  85  BILITOT 1.5* 1.2  PROT 7.1 6.5  ALBUMIN 4.1 3.1*   Recent Labs  Lab 05/07/18 0057  LIPASE 29   No results for input(s): AMMONIA in the last 168 hours. Coagulation Profile: No results for input(s): INR, PROTIME in the last 168 hours. Cardiac Enzymes: No results for input(s): CKTOTAL, CKMB, CKMBINDEX, TROPONINI in the last 168 hours. BNP (last 3 results) No results for input(s): PROBNP in the last 8760 hours. HbA1C: No results for input(s): HGBA1C in the last 72 hours. CBG: No results for input(s): GLUCAP in the last 168 hours. Lipid Profile: No results for input(s): CHOL, HDL, LDLCALC, TRIG, CHOLHDL, LDLDIRECT in the last 72 hours. Thyroid Function Tests: No results for input(s): TSH, T4TOTAL, FREET4, T3FREE, THYROIDAB in the last 72 hours. Anemia Panel: No results for input(s): VITAMINB12, FOLATE, FERRITIN, TIBC, IRON, RETICCTPCT in the last 72 hours. Urine analysis:    Component Value Date/Time   COLORURINE AMBER (A) 05/07/2018 0306   APPEARANCEUR HAZY (A) 05/07/2018 0306   LABSPEC 1.025 05/07/2018 0306   PHURINE 5.0 05/07/2018 0306   GLUCOSEU NEGATIVE 05/07/2018 0306   HGBUR MODERATE (A) 05/07/2018 0306   BILIRUBINUR NEGATIVE 05/07/2018 0306   KETONESUR 5 (A) 05/07/2018 0306   PROTEINUR 30 (A) 05/07/2018 0306   NITRITE POSITIVE (A) 05/07/2018 0306   LEUKOCYTESUR LARGE (A) 05/07/2018 0306   Sepsis Labs: @LABRCNTIP (procalcitonin:4,lacticidven:4)  ) Recent Results (from the past 240 hour(s))  Urine culture     Status: Abnormal   Collection Time: 05/07/18  3:06 AM  Result Value Ref Range Status   Specimen Description URINE, CLEAN CATCH  Final   Special Requests ADDED 0533 TO STERILE CONTAINER  Final   Culture >=100,000 COLONIES/mL ESCHERICHIA COLI (A)  Final   Report Status 05/09/2018 FINAL  Final   Organism ID, Bacteria ESCHERICHIA COLI (A)  Final      Susceptibility   Escherichia coli - MIC*    AMPICILLIN 4 SENSITIVE Sensitive     CEFAZOLIN <=4 SENSITIVE  Sensitive     CEFTRIAXONE <=1 SENSITIVE Sensitive     CIPROFLOXACIN <=0.25 SENSITIVE Sensitive     GENTAMICIN <=1 SENSITIVE Sensitive     IMIPENEM <=0.25 SENSITIVE Sensitive     NITROFURANTOIN <=16 SENSITIVE Sensitive     TRIMETH/SULFA <=20 SENSITIVE Sensitive     AMPICILLIN/SULBACTAM <=2 SENSITIVE Sensitive     PIP/TAZO <=4 SENSITIVE Sensitive     Extended ESBL NEGATIVE Sensitive     * >=100,000 COLONIES/mL ESCHERICHIA COLI  Culture, blood (routine x 2)     Status: None (Preliminary result)   Collection Time: 05/07/18  2:15 PM  Result Value Ref Range Status   Specimen Description BLOOD RIGHT HAND  Final   Special Requests   Final    BOTTLES DRAWN AEROBIC AND ANAEROBIC Blood Culture adequate volume   Culture   Final    NO GROWTH 2 DAYS Performed at [facility], [address], [place], [place]  [address]    Report Status PENDING  Incomplete  Culture, blood (routine x 2)     Status: None (Preliminary result)   Collection Time: 05/07/18  2:27 PM  Result Value Ref Range Status   Specimen Description BLOOD RIGHT HAND  Final   Special Requests   Final    BOTTLES DRAWN AEROBIC AND ANAEROBIC Blood Culture adequate volume   Culture   Final    NO GROWTH 2 DAYS Performed at [facility], [address] [place], [place] [address]    Report Status PENDING  Incomplete      Studies: No results found.  Scheduled Meds: . rivaroxaban  20 mg Oral Q supper  . rosuvastatin  10 mg Oral Daily  . tamsulosin  0.4 mg Oral QHS    Continuous Infusions: . sodium chloride Stopped (05/08/18 0630)  . cefTRIAXone (ROCEPHIN)  IV 1 g (05/09/18 0981)     LOS: 1 day     Darlin Drop, MD [facility] Pager [phone]  If 7PM-7AM, please contact night-coverage [email or web address] Password [facility] 05/09/2018, 6:12 PM

## 2018-05-09 NOTE — Evaluation (Signed)
Occupational Therapy Evaluation Patient Details Name: Jason Myers MRN: [id number] DOB: 04-09-1951 Today's Date: 05/09/2018    History of Present Illness Pt is a 67 y/o male admitted secondary to abdominal pain possibly secondary to bilary colic or indigestion. 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 PMH includes CVA with L hemiplegia, CHF, and PE.    Clinical Impression   PT admitted with acute PE. Pt currently with functional limitiations due to the deficits listed below (see OT problem list). Pt currently with decr activity tolerance and incr time to complete basic adls. Pt relies on environmental supports and from discussion used furniture walking at baseline. Pt reports using the chair railing in the halls of apartment facility to transfer around the building. Pt self reports "I just get so tired quickly"  Pt will benefit from skilled OT to increase their independence and safety with adls and balance to allow discharge HHOT.     Follow Up Recommendations  Home health OT    Equipment Recommendations  None recommended by OT    Recommendations for Other Services       Precautions / Restrictions Precautions Precautions: Fall Restrictions Weight Bearing Restrictions: No      Mobility Bed Mobility Overal bed mobility: Needs Assistance Bed Mobility: Supine to Sit     Supine to sit: Supervision     General bed mobility comments: sitting eob to don socks. pt able to reach feet to don socks  Transfers Overall transfer level: Needs assistance Equipment used: None Transfers: Sit to/from Stand Sit to Stand: Supervision              Balance Overall balance assessment: Needs assistance Sitting-balance support: No upper extremity supported;Feet supported Sitting balance-Leahy Scale: Good     Standing balance support: Single extremity supported;No upper extremity supported;During functional activity Standing balance-Leahy  Scale: Fair Standing balance comment: Reliant on external support                            ADL either performed or assessed with clinical judgement   ADL Overall ADL's : Needs assistance/impaired Eating/Feeding: Modified independent   Grooming: Wash/dry hands;Supervision/safety Grooming Details (indicate cue type and reason): holding onto environmental supports Upper Body Bathing: Min guard   Lower Body Bathing: Min guard           Toilet Transfer: Min guard           Functional mobility during ADLs: Min guard General ADL Comments: pt reports normally moves about apartment without device. pt expressed the love to play chess.      Vision         Perception     Praxis      Pertinent Vitals/Pain Pain Assessment: No/denies pain     Hand Dominance Right   Extremity/Trunk Assessment Upper Extremity Assessment Upper Extremity Assessment: LUE deficits/detail LUE Deficits / Details: wrist contracture present in flexion, digit contractures, elbow flexion contracture, shoulder flexion with R UE (A) to 90 degrees. scapula slighly abducted but able to activate into adduction   Lower Extremity Assessment Lower Extremity Assessment: Defer to PT evaluation LLE Deficits / Details: LLE hemiplegia at baseline    Cervical / Trunk Assessment Cervical / Trunk Assessment: Normal   Communication Communication Communication: No difficulties   Cognition Arousal/Alertness: Awake/alert Behavior During Therapy: WFL for tasks assessed/performed Overall Cognitive Status: History of cognitive impairments - at baseline  General Comments       Exercises     Shoulder Instructions      Home Living Family/patient expects to be discharged to:: Private residence Living Arrangements: Alone Available Help at Discharge: Friend(s);Available PRN/intermittently Type of Home: Apartment(public housing ) Home Access:  Elevator     Home Layout: One level     Bathroom Shower/Tub: Chief Strategy Officer: Standard     Home Equipment: Cane - single point;Grab bars - tub/shower;Grab bars - toilet;Wheelchair - manual   Additional Comments: rarely used Chiropodist bed;brother lives close by, but does not provide much assistance      Prior Functioning/Environment Level of Independence: Needs assistance  Gait / Transfers Assistance Needed: limited community ambulator, reports occasional use of cane  ADL's / Homemaking Assistance Needed: independent with ADLS, has aide 2x/wk to assist with iADLs   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, 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        OT Problem List: Decreased strength;Decreased activity tolerance;Impaired balance (sitting and/or standing);Decreased safety awareness;Decreased knowledge of use of DME or AE;Decreased knowledge of precautions;Impaired UE functional use      OT Treatment/Interventions: Self-care/ADL training;Therapeutic exercise;Neuromuscular education;Energy conservation;DME and/or AE instruction;Manual therapy;Modalities;Therapeutic activities;Cognitive remediation/compensation;Patient/family education;Balance training    OT Goals(Current goals can be found in the care plan section) Acute Rehab OT Goals Patient Stated Goal: to go home  OT Goal Formulation: With patient Time For Goal Achievement: 05/23/18 Potential to Achieve Goals: Good  OT Frequency: Min 2X/week   Barriers to D/C: Decreased caregiver support          Co-evaluation              AM-PAC PT "6 Clicks" Daily Activity     Outcome Measure Help from another person eating meals?: None Help from another person taking care of personal grooming?: A Little Help from another person toileting, which includes using toliet, bedpan, or urinal?: A Little Help from another person bathing (including washing, rinsing, drying)?: A Little Help from another person to put on and taking off  regular upper body clothing?: A Little Help from another person to put on and taking off regular lower body clothing?: A Little 6 Click Score: 19   End of Session Equipment Utilized During Treatment: Gait belt Nurse Communication: Mobility status;Precautions  Activity Tolerance: Patient tolerated treatment well Patient left: in chair;with call bell/phone within reach  OT Visit Diagnosis: Unsteadiness on feet (R26.81);Muscle weakness (generalized) (M62.81)                Time: 1610-9604 OT Time Calculation (min): 23 min Charges:  OT General Charges $OT Visit: 1 Visit OT Evaluation $OT Eval Moderate Complexity: 1 Mod   Mateo Flow, OTR/L  Acute Rehabilitation Services Pager: [phone] Office: [phone] .   Boone Master B 05/09/2018, 3:59 PM

## 2018-05-10 DIAGNOSIS — R1084 Generalized abdominal pain: Secondary | ICD-10-CM | POA: Diagnosis not present

## 2018-05-10 DIAGNOSIS — I5032 Chronic diastolic (congestive) heart failure: Secondary | ICD-10-CM | POA: Diagnosis not present

## 2018-05-10 DIAGNOSIS — N3000 Acute cystitis without hematuria: Secondary | ICD-10-CM | POA: Diagnosis not present

## 2018-05-10 DIAGNOSIS — K56609 Unspecified intestinal obstruction, unspecified as to partial versus complete obstruction: Secondary | ICD-10-CM | POA: Diagnosis not present

## 2018-05-10 DIAGNOSIS — N390 Urinary tract infection, site not specified: Secondary | ICD-10-CM | POA: Diagnosis not present

## 2018-05-10 LAB — CBC
HCT: 48.1 % (ref 39.0–52.0)
HEMOGLOBIN: 15.6 g/dL (ref 13.0–17.0)
MCH: 28.8 pg (ref 26.0–34.0)
MCHC: 32.4 g/dL (ref 30.0–36.0)
MCV: 88.7 fL (ref 78.0–100.0)
Platelets: 239 10*3/uL (ref 150–400)
RBC: 5.42 MIL/uL (ref 4.22–5.81)
RDW: 14 % (ref 11.5–15.5)
WBC: 6 10*3/uL (ref 4.0–10.5)

## 2018-05-10 LAB — BASIC METABOLIC PANEL
Anion gap: 8 (ref 5–15)
BUN: 18 mg/dL (ref 8–23)
CHLORIDE: 106 mmol/L (ref 98–111)
CO2: 23 mmol/L (ref 22–32)
Calcium: 9.4 mg/dL (ref 8.9–10.3)
Creatinine, Ser: 0.83 mg/dL (ref 0.61–1.24)
GFR calc Af Amer: >60 mL/min (ref >60)
GFR calc non Af Amer: >60 mL/min (ref >60)
Glucose, Bld: 119 mg/dL — ABNORMAL HIGH (ref 70–99)
POTASSIUM: 4.5 mmol/L (ref 3.5–5.1)
Sodium: 137 mmol/L (ref 135–145)

## 2018-05-10 LAB — PHOSPHORUS: Phosphorus: 4 mg/dL (ref 2.5–4.6)

## 2018-05-10 LAB — MAGNESIUM: Magnesium: 2 mg/dL (ref 1.7–2.4)

## 2018-05-10 MED ORDER — RIVAROXABAN 20 MG PO TABS: 20.0000 mg | ORAL_TABLET | Freq: Every day | ORAL | 0 refills | Status: DC

## 2018-05-10 MED ORDER — CEPHALEXIN 500 MG PO CAPS: 500.0000 mg | ORAL_CAPSULE | Freq: Three times a day (TID) | ORAL | 0 refills | Status: AC

## 2018-05-10 MED ORDER — CEPHALEXIN 500 MG PO CAPS
500.0000 mg | ORAL_CAPSULE | Freq: Three times a day (TID) | ORAL | Status: DC
  Administered 2018-05-10: 500 mg via ORAL
  Filled 2018-05-10: qty 1

## 2018-05-10 NOTE — Social Work (Signed)
CSW acknowledging "imminent discharge consult". Pt declining SNF, requesting transportation home. Feel cab voucher is safest for pt- will provide one for bedside RN.   CSW signing off. Please consult if any additional needs arise.  Doy Hutching, LCSWA [facility] Clinical Social Work [phone]

## 2018-05-10 NOTE — Progress Notes (Signed)
Occupational Therapy Treatment Patient Details Name: Jason Myers MRN: [id number] DOB: February 22, 1951 Today's Date: 05/10/2018    History of present illness Pt is a 67 y/o male admitted secondary to abdominal pain possibly secondary to bilary colic or indigestion. 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 PMH includes CVA with L hemiplegia, CHF, and PE.    OT comments  Pt progressing towards acute OT goals. Pt is for d/c home today.  Follow Up Recommendations  Home health OT    Equipment Recommendations  None recommended by OT    Recommendations for Other Services      Precautions / Restrictions Precautions Precautions: Fall Precaution Comments: prior CVA with L sided weakness and UE contractures Restrictions Weight Bearing Restrictions: No       Mobility Bed Mobility Overal bed mobility: Needs Assistance Bed Mobility: Supine to Sit     Supine to sit: Supervision     General bed mobility comments: supervision for safety  Transfers Overall transfer level: Needs assistance Equipment used: None Transfers: Sit to/from Stand Sit to Stand: Min assist         General transfer comment: min A for stability and safety with rise into standing from EOB    Balance Overall balance assessment: Needs assistance Sitting-balance support: Feet supported Sitting balance-Leahy Scale: Good Sitting balance - Comments: pt able to don shoes sitting EOB with supervision for safety   Standing balance support: No upper extremity supported;Single extremity supported Standing balance-Leahy Scale: Poor Standing balance comment: Reliant on external support                            ADL either performed or assessed with clinical judgement   ADL Overall ADL's : Needs assistance/impaired                 Upper Body Dressing : Set up;Sitting   Lower Body Dressing: Set up;Sitting/lateral leans;Sit to/from stand;Min guard                  General ADL Comments: pt completed UB/LB dressing. Discussed ADL/fall prevention strategies and recommended pt keep cell phone on his person at all times at home.      Vision       Perception     Praxis      Cognition Arousal/Alertness: Awake/alert Behavior During Therapy: WFL for tasks assessed/performed Overall Cognitive Status: History of cognitive impairments - at baseline Area of Impairment: Safety/judgement;Problem solving                         Safety/Judgement: Decreased awareness of deficits;Decreased awareness of safety   Problem Solving: Requires verbal cues General Comments: tangental responses, verbal perseveration        Exercises     Shoulder Instructions       General Comments      Pertinent Vitals/ Pain       Pain Assessment: No/denies pain  Home Living                                          Prior Functioning/Environment              Frequency  Min 2X/week        Progress Toward Goals  OT Goals(current goals can now be found  in the care plan section)  Progress towards OT goals: Progressing toward goals  Acute Rehab OT Goals Patient Stated Goal: to go home  OT Goal Formulation: With patient Time For Goal Achievement: 05/23/18 Potential to Achieve Goals: Good ADL Goals Pt Will Perform Upper Body Dressing: with modified independence Pt Will Perform Lower Body Dressing: with modified independence Pt Will Transfer to Toilet: with modified independence Additional ADL Goal #1: pt will gather an adl item required and relocate item to sink level mod I without LOB  Plan Discharge plan remains appropriate    Co-evaluation                 AM-PAC PT "6 Clicks" Daily Activity     Outcome Measure   Help from another person eating meals?: None Help from another person taking care of personal grooming?: A Little Help from another person toileting, which includes using toliet, bedpan,  or urinal?: A Little Help from another person bathing (including washing, rinsing, drying)?: A Little Help from another person to put on and taking off regular upper body clothing?: A Little Help from another person to put on and taking off regular lower body clothing?: A Little 6 Click Score: 19    End of Session    OT Visit Diagnosis: Unsteadiness on feet (R26.81);Muscle weakness (generalized) (M62.81)   Activity Tolerance Patient tolerated treatment well   Patient Left in bed;with call bell/phone within reach   Nurse Communication          Time: 1610-9604 OT Time Calculation (min): 22 min  Charges: OT General Charges $OT Visit: 1 Visit OT Treatments $Self Care/Home Management : 8-22 mins  Raynald Kemp, OT Acute Rehabilitation Services Pager: [phone] Office: [phone]   Pilar Grammes 05/10/2018, 2:01 PM

## 2018-05-10 NOTE — Discharge Summary (Signed)
Discharge Summary  Jason Myers ZOX:[id number] DOB: Nov 04, 1950  PCP: Gean Birchwood, [facility]  Admit date: 05/07/2018 Discharge date: 05/10/2018  Time spent: 25 minutes  Recommendations for Outpatient Follow-up:  1. Follow-up with your PCP 2. Continue physical therapy 3. Fall precautions   Discharge Diagnoses:  Active Hospital Problems   Diagnosis Date Noted  . Abdominal pain 05/07/2018  . Acute lower UTI 05/07/2018  . SBO (small bowel obstruction) (HCC) 05/07/2018  . Chronic diastolic CHF (congestive heart failure) (HCC) 01/16/2018  . PE (pulmonary thromboembolism) (HCC) 11/13/2016  . HLD (hyperlipidemia) 06/08/2007    Resolved Hospital Problems  No resolved problems to display.    Discharge Condition: Stable   Diet recommendation: Resume prior diet  Vitals:   05/09/18 2137 05/10/18 0553  BP: (!) 107/94 131/86  Pulse: 83 85  Resp: 16 18  Temp: 98.9 F (37.2 C) 98.3 F (36.8 C)  SpO2: 95% 95%    History of present illness:  Jason Myers an 67 y.o.malewith medical history significant ofHLD; hemorrhagic CVA s/p ruptured aneurysm (1984); chronic diastolic CHF; BPH; and hospitalization for acute PE (no longer on AC) and ileitis in 6/19 presenting with abdominal pain.He reports that he was here with a PE and SBO in June. He stopped taking anticoagulation after hospital discharge due to "bad teeth that need to be extracted." Yesterday, he went to bed early but had difficulty sleeping. He had abdominal pressure and indigestion. He also noticed difficulty urinating - he had hesitancy and then post-void dribbling and he sailed his pants. He noticed indigestion like when he had his prior SBO, but no n/v. He has not previously had abdominal surgery. His last BM was Sunday and was unremarkable. He has not had fever.  05/09/18: Pt seen and examined at his bedside.  States his symptoms are improving.  Had a bowel movement yesterday and this morning.  05/10/18: Seen and  examined at his bedside. No acute events overnight. States he feels well.   On the day of discharge the patient was hemodynamically stable. He will need to follow up with his PCP post hospitalization.   Hospital Course:  Principal Problem:   Abdominal pain Active Problems:   HLD (hyperlipidemia)   PE (pulmonary thromboembolism) (HCC)   Chronic diastolic CHF (congestive heart failure) (HCC)   Acute lower UTI   SBO (small bowel obstruction) (HCC)  Resolved Abdominal pain secondary to UTI Has bowel movements; has good bowel sounds. SBO ruled out Completed IV Rocephin Start keflex 500 mg tid x 7 days  Resolving UTI Management as stated above Urine culture grew greater than 100,000 E. coli pansensitive Continue keflex x 7 days Blood cultures x2 negative to date  BPH Appears stable C/w tamsulosin  History of recurrent PE Continue Xarelto  History of hemorrhagic CVA Left upper extremity and lower extremity weakness  Ambulatory dysfunction PT recommend SNF. Patient declines SNF Fall precautions Home health PT   Code Status: Full code   Consultants:  None  Procedures:  None   Discharge Exam: BP 131/86 (BP Location: Right Arm)   Pulse 85   Temp 98.3 F (36.8 C) (Oral)   Resp 18   Ht 6\' 1"  (1.854 m)   Wt 90.7 kg   SpO2 95%   BMI 26.39 kg/m  . General: 67 y.o. year-old male well developed well nourished in no acute distress.  Alert and oriented x3. . Cardiovascular: Regular rate and rhythm with no rubs or gallops.  No thyromegaly or JVD noted.   Marland Kitchen  Respiratory: Clear to auscultation with no wheezes or rales. Good inspiratory effort. . Abdomen: Soft nontender nondistended with normal bowel sounds x4 quadrants. . Musculoskeletal: No lower extremity edema. 2/4 pulses in all 4 extremities. . Skin: No ulcerative lesions noted or rashes, . Psychiatry: Mood is appropriate for condition and setting  Discharge Instructions You were cared for by a  hospitalist during your hospital stay. If you have any questions about your discharge medications or the care you received while you were in the hospital after you are discharged, you can call the unit and asked to speak with the hospitalist on call if the hospitalist that took care of you is not available. Once you are discharged, your primary care physician will handle any further medical issues. Please note that NO REFILLS for any discharge medications will be authorized once you are discharged, as it is imperative that you return to your primary care physician (or establish a relationship with a primary care physician if you do not have one) for your aftercare needs so that they can reassess your need for medications and monitor your lab values.   Allergies as of 05/10/2018   No Known Allergies     Medication List    STOP taking these medications   naproxen sodium 220 MG tablet Commonly known as:  ALEVE   Rivaroxaban 15 & 20 MG Tbpk Replaced by:  rivaroxaban 20 MG Tabs tablet     TAKE these medications   cephALEXin 500 MG capsule Commonly known as:  KEFLEX Take 1 capsule (500 mg total) by mouth every 8 (eight) hours for 7 days.   rivaroxaban 20 MG Tabs tablet Commonly known as:  XARELTO Take 1 tablet (20 mg total) by mouth daily with supper. Replaces:  Rivaroxaban 15 & 20 MG Tbpk   rosuvastatin 10 MG tablet Commonly known as:  CRESTOR Take 10 mg by mouth daily.   tamsulosin 0.4 MG Caps capsule Commonly known as:  FLOMAX Take 0.4 mg by mouth at bedtime.            Durable Medical Equipment  (From admission, onward)         Start     Ordered   05/09/18 1053  For home use only DME 3 n 1  Once     05/09/18 1052         No Known Allergies Follow-up Information    [facility] Follow up.   Specialty:  Home Health Services Contact information: [address] [place] [address] [phone]        [facility]. Call in 1 day(s).     Specialty:  Internal Medicine Why:  Please call for a post hospital appointment. Contact information: [address] [place] [address] [phone]            The results of significant diagnostics from this hospitalization (including imaging, microbiology, ancillary and laboratory) are listed below for reference.    Significant Diagnostic Studies: Dg Abd 1 View  Result Date: 05/07/2018 CLINICAL DATA:  Nasogastric tube placement EXAM: ABDOMEN - 1 VIEW COMPARISON:  CT from earlier today FINDINGS: Nasogastric tube tip the level of the proximal duodenum. The stomach is decompressed. Nonobstructive bowel gas pattern. Contrast in the partially distended bladder from recent enhanced CT. IMPRESSION: 1. Nasogastric tube tip at the proximal duodenum. 2. Nonobstructive bowel gas pattern. Electronically Signed   By: Marnee Spring M.D.   On: 05/07/2018 10:00   Ct Angio Chest Pe W And/or Wo  Contrast  Result Date: 05/07/2018 CLINICAL DATA:  PE suspected, high pretest prob; Abd pain, acute, generalized. Vomiting. EXAM: CT ANGIOGRAPHY CHEST CT ABDOMEN AND PELVIS WITH CONTRAST TECHNIQUE: Multidetector CT imaging of the chest was performed using the standard protocol during bolus administration of intravenous contrast. Multiplanar CT image reconstructions and MIPs were obtained to evaluate the vascular anatomy. Multidetector CT imaging of the abdomen and pelvis was performed using the standard protocol during bolus administration of intravenous contrast. Delayed acquisition of the abdomen pelvis portion of the exam due to vomiting after chest CT. CONTRAST:  ISOVUE-370 IOPAMIDOL (ISOVUE-370) INJECTION 76% COMPARISON:  CT 01/09/2018. FINDINGS: CTA CHEST FINDINGS Cardiovascular: No new filling defects within the pulmonary arteries to suggest acute pulmonary embolus. Mild residual eccentric wall thickening/chronic thrombus in the segmental branches in the right lower lobe, improved from prior exam.  Thoracic aorta is normal in caliber. Heart is normal in size. No pericardial effusion. Mediastinum/Nodes: No enlarged mediastinal or hilar lymph nodes. Enlarged right lobe of the thyroid gland with hypodense nodule, previously biopsied 11/15/2016. The esophagus is nondistended. Lungs/Pleura: Breathing motion artifact partially obscures evaluation. Slight heterogeneity of parenchyma in the upper lobes, similar to prior exam and may be post infectious or inflammatory. Mild right lower lobe scarring. Scattered atelectasis throughout both lungs. No confluent airspace disease. No evidence pulmonary edema. No pleural effusion. Musculoskeletal: There are no acute or suspicious osseous abnormalities. Review of the MIP images confirms the above findings. CT ABDOMEN and PELVIS FINDINGS Hepatobiliary: No focal liver abnormality is seen. No gallstones, gallbladder wall thickening, or biliary dilatation. Pancreas: No ductal dilatation or inflammation. Spleen: Normal in size without focal abnormality. Adrenals/Urinary Tract: No adrenal nodule. No hydronephrosis. No perinephric edema. Multiple bilateral renal cysts, including a dominant cyst in the upper left kidney spanning 10.2 cm. No suspicious solid lesion. Urinary bladder is partially distended without wall thickening. Stomach/Bowel: Stomach is nondistended. No gastric wall thickening. No evidence of bowel obstruction. Fluid-filled normal caliber small bowel in the lower abdomen without wall thickening or inflammatory change. Colonic diverticulosis of the distal colon without diverticulitis. Normal appendix. Vascular/Lymphatic: No acute vascular finding. Mild aortic atherosclerosis. 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 No enlarged abdominopelvic lymph nodes. Reproductive: Enlarged prostate gland spans 5.9 cm. Central prostatic calcifications. Other: Trace free fluid in the pelvis. No free air. No intra-abdominal abscess.  Musculoskeletal: Prominent facet arthropathy in the lower lumbar spine. There are no acute or suspicious osseous abnormalities. Review of the MIP images confirms the above findings. IMPRESSION: 1. No acute pulmonary embolus. Minimal residual chronic thrombus in the right lower lobe pulmonary arteries, improved from exam almost 4 months ago. 2. Scattered atelectasis without other acute chest finding. 3. Fluid-filled nondilated small bowel in the lower abdomen, may represent enteritis or ileus. Very early small bowel obstruction is also considered given similar appearance on prior exam. 4. Incidental findings of colonic diverticulosis, bilateral renal cysts, enlarged prostate gland, and Aortic Atherosclerosis (ICD10-I70.0). Electronically Signed   By: Narda Rutherford M.D.   On: 05/07/2018 04:58   Ct Abdomen Pelvis W Contrast  Result Date: 05/07/2018 CLINICAL DATA:  PE suspected, high pretest prob; Abd pain, acute, generalized. Vomiting. EXAM: CT ANGIOGRAPHY CHEST CT ABDOMEN AND PELVIS WITH CONTRAST TECHNIQUE: Multidetector CT imaging of the chest was performed using the standard protocol during bolus administration of intravenous contrast. Multiplanar CT image reconstructions and MIPs were obtained to evaluate the vascular anatomy. Multidetector CT imaging of the abdomen and pelvis was  performed using the standard protocol during bolus administration of intravenous contrast. Delayed acquisition of the abdomen pelvis portion of the exam due to vomiting after chest CT. CONTRAST:  ISOVUE-370 IOPAMIDOL (ISOVUE-370) INJECTION 76% COMPARISON:  CT 01/09/2018. FINDINGS: CTA CHEST FINDINGS Cardiovascular: No new filling defects within the pulmonary arteries to suggest acute pulmonary embolus. Mild residual eccentric wall thickening/chronic thrombus in the segmental branches in the right lower lobe, improved from prior exam. Thoracic aorta is normal in caliber. Heart is normal in size. No pericardial effusion.  Mediastinum/Nodes: No enlarged mediastinal or hilar lymph nodes. Enlarged right lobe of the thyroid gland with hypodense nodule, previously biopsied 11/15/2016. The esophagus is nondistended. Lungs/Pleura: Breathing motion artifact partially obscures evaluation. Slight heterogeneity of parenchyma in the upper lobes, similar to prior exam and may be post infectious or inflammatory. Mild right lower lobe scarring. Scattered atelectasis throughout both lungs. No confluent airspace disease. No evidence pulmonary edema. No pleural effusion. Musculoskeletal: There are no acute or suspicious osseous abnormalities. Review of the MIP images confirms the above findings. CT ABDOMEN and PELVIS FINDINGS Hepatobiliary: No focal liver abnormality is seen. No gallstones, gallbladder wall thickening, or biliary dilatation. Pancreas: No ductal dilatation or inflammation. Spleen: Normal in size without focal abnormality. Adrenals/Urinary Tract: No adrenal nodule. No hydronephrosis. No perinephric edema. Multiple bilateral renal cysts, including a dominant cyst in the upper left kidney spanning 10.2 cm. No suspicious solid lesion. Urinary bladder is partially distended without wall thickening. Stomach/Bowel: Stomach is nondistended. No gastric wall thickening. No evidence of bowel obstruction. Fluid-filled normal caliber small bowel in the lower abdomen without wall thickening or inflammatory change. Colonic diverticulosis of the distal colon without diverticulitis. Normal appendix. Vascular/Lymphatic: No acute vascular finding. Mild aortic atherosclerosis. 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 No enlarged abdominopelvic lymph nodes. Reproductive: Enlarged prostate gland spans 5.9 cm. Central prostatic calcifications. Other: Trace free fluid in the pelvis. No free air. No intra-abdominal abscess. Musculoskeletal: Prominent facet arthropathy in the lower lumbar spine. There are no acute or  suspicious osseous abnormalities. Review of the MIP images confirms the above findings. IMPRESSION: 1. No acute pulmonary embolus. Minimal residual chronic thrombus in the right lower lobe pulmonary arteries, improved from exam almost 4 months ago. 2. Scattered atelectasis without other acute chest finding. 3. Fluid-filled nondilated small bowel in the lower abdomen, may represent enteritis or ileus. Very early small bowel obstruction is also considered given similar appearance on prior exam. 4. Incidental findings of colonic diverticulosis, bilateral renal cysts, enlarged prostate gland, and Aortic Atherosclerosis (ICD10-I70.0). Electronically Signed   By: Narda Rutherford M.D.   On: 05/07/2018 04:58    Microbiology: Recent Results (from the past 240 hour(s))  Urine culture     Status: Abnormal   Collection Time: 05/07/18  3:06 AM  Result Value Ref Range Status   Specimen Description URINE, CLEAN CATCH  Final   Special Requests ADDED 0533 TO STERILE CONTAINER  Final   Culture >=100,000 COLONIES/mL ESCHERICHIA COLI (A)  Final   Report Status 05/09/2018 FINAL  Final   Organism ID, Bacteria ESCHERICHIA COLI (A)  Final      Susceptibility   Escherichia coli - MIC*    AMPICILLIN 4 SENSITIVE Sensitive     CEFAZOLIN <=4 SENSITIVE Sensitive     CEFTRIAXONE <=1 SENSITIVE Sensitive     CIPROFLOXACIN <=0.25 SENSITIVE Sensitive     GENTAMICIN <=1 SENSITIVE Sensitive     IMIPENEM <=0.25 SENSITIVE Sensitive  NITROFURANTOIN <=16 SENSITIVE Sensitive     TRIMETH/SULFA <=20 SENSITIVE Sensitive     AMPICILLIN/SULBACTAM <=2 SENSITIVE Sensitive     PIP/TAZO <=4 SENSITIVE Sensitive     Extended ESBL NEGATIVE Sensitive     * >=100,000 COLONIES/mL ESCHERICHIA COLI  Culture, blood (routine x 2)     Status: None (Preliminary result)   Collection Time: 05/07/18  2:15 PM  Result Value Ref Range Status   Specimen Description BLOOD RIGHT HAND  Final   Special Requests   Final    BOTTLES DRAWN AEROBIC AND  ANAEROBIC Blood Culture adequate volume   Culture   Final    NO GROWTH 3 DAYS Performed at [facility], [address] [place], [place] [address]    Report Status PENDING  Incomplete  Culture, blood (routine x 2)     Status: None (Preliminary result)   Collection Time: 05/07/18  2:27 PM  Result Value Ref Range Status   Specimen Description BLOOD RIGHT HAND  Final   Special Requests   Final    BOTTLES DRAWN AEROBIC AND ANAEROBIC Blood Culture adequate volume   Culture   Final    NO GROWTH 3 DAYS Performed at [facility], [address] [place], [place] [address]    Report Status PENDING  Incomplete     Labs: Basic Metabolic Panel: Recent Labs  Lab 05/07/18 0057 05/08/18 0609 05/09/18 0520 05/10/18 0432  NA 138 136 138 137  K 3.7 3.7 4.2 4.5  CL 107 109 108 106  CO2 21* 21* 22 23  GLUCOSE 129* 89 96 119*  BUN 22 16 12 18   CREATININE 1.07 0.90 0.78 0.83  CALCIUM 9.0 8.5* 9.0 9.4  MG  --   --   --  2.0  PHOS  --   --   --  4.0   Liver Function Tests: Recent Labs  Lab 05/07/18 0057 05/08/18 0609  AST 26 20  ALT 24 23  ALKPHOS 74 85  BILITOT 1.5* 1.2  PROT 7.1 6.5  ALBUMIN 4.1 3.1*   Recent Labs  Lab 05/07/18 0057  LIPASE 29   No results for input(s): AMMONIA in the last 168 hours. CBC: Recent Labs  Lab 05/07/18 0057 05/08/18 0609 05/09/18 0520 05/10/18 0432  WBC 19.6* 16.4* 7.8 6.0  HGB 16.6 15.1 15.8 15.6  HCT 50.6 45.6 47.4 48.1  MCV 89.6 89.2 88.3 88.7  PLT 240 185 189 239   Cardiac Enzymes: No results for input(s): CKTOTAL, CKMB, CKMBINDEX, TROPONINI in the last 168 hours. BNP: BNP (last 3 results) Recent Labs    01/16/18 1051  BNP 2,245.1*    ProBNP (last 3 results) No results for input(s): PROBNP in the last 8760 hours.  CBG: No results for input(s): GLUCAP in the last 168 hours.     Signed:  Darlin Drop, MD [facility] 05/10/2018, 9:42 AM

## 2018-05-10 NOTE — Progress Notes (Signed)
Pt discharged home in stable condition after going over discharge instructions with no concerns voiced. Taxi voucher provided by Child psychotherapist for transportation home

## 2018-05-10 NOTE — Progress Notes (Addendum)
Physical Therapy Treatment Patient Details Name: Jason Myers MRN: [id number] DOB: November 09, 1950 Today's Date: 05/10/2018    History of Present Illness Pt is a 67 y/o male admitted secondary to abdominal pain possibly secondary to bilary colic or indigestion. 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 PMH includes CVA with L hemiplegia, CHF, and PE.     PT Comments    Worked on dynamic sitting balance activity with supervision for safety and static standing balance with min guard to min A. Pt reporting that he rarely stands statically secondary to his poor balance. Pt continues to require min-mod A with ambulation secondary to gait abnormalities which cause his balance to be very poor. He also has decreased safety awareness with functional mobility. Pt appears to be at an increased risk for falls and continues to adamantly request to return home alone. PT continuing to recommend pt d/c to SNF.   Pt would continue to benefit from skilled physical therapy services at this time while admitted and after d/c to address the below listed limitations in order to improve overall safety and independence with functional mobility.   Follow Up Recommendations  SNF     Equipment Recommendations  3in1 (PT)    Recommendations for Other Services       Precautions / Restrictions Precautions Precautions: Fall Precaution Comments: prior CVA with L sided weakness and UE contractures Restrictions Weight Bearing Restrictions: No    Mobility  Bed Mobility Overal bed mobility: Needs Assistance Bed Mobility: Supine to Sit     Supine to sit: Supervision     General bed mobility comments: supervision for safety  Transfers Overall transfer level: Needs assistance Equipment used: None Transfers: Sit to/from Stand Sit to Stand: Min assist         General transfer comment: min A for stability and safety with rise into standing from  EOB  Ambulation/Gait Ambulation/Gait assistance: Min assist;Mod assist Gait Distance (Feet): 150 Feet Assistive device: None;1 person hand held assist Gait Pattern/deviations: Step-to pattern;Decreased step length - right;Decreased step length - left;Decreased stance time - left;Decreased stride length;Decreased weight shift to left;Decreased dorsiflexion - left(genu recurvatum of L knee with stance phase) Gait velocity: decreased Gait velocity interpretation: <1.8 ft/sec, indicate of risk for recurrent falls General Gait Details: pt with decreased gait speed, very unsteady without use of an AD but constantly reaching for support surfaces (1HHA provided as well). Pt with drop foot on L with use of compensatory strategy of excessive R lateral weight shift to clear foot with swing phase   Stairs             Wheelchair Mobility    Modified Rankin (Stroke Patients Only)       Balance Overall balance assessment: Needs assistance Sitting-balance support: Feet supported Sitting balance-Leahy Scale: Good Sitting balance - Comments: pt able to don shoes sitting EOB with supervision for safety   Standing balance support: No upper extremity supported;Single extremity supported Standing balance-Leahy Scale: Poor                              Cognition Arousal/Alertness: Awake/alert Behavior During Therapy: WFL for tasks assessed/performed Overall Cognitive Status: History of cognitive impairments - at baseline Area of Impairment: Safety/judgement;Problem solving                         Safety/Judgement: Decreased awareness of deficits;Decreased awareness  of safety   Problem Solving: Requires verbal cues        Exercises      General Comments        Pertinent Vitals/Pain Pain Assessment: No/denies pain    Home Living                      Prior Function            PT Goals (current goals can now be found in the care plan section)  Acute Rehab PT Goals PT Goal Formulation: With patient Time For Goal Achievement: 05/22/18 Potential to Achieve Goals: Good Progress towards PT goals: Progressing toward goals    Frequency    Min 3X/week      PT Plan Current plan remains appropriate    Co-evaluation              AM-PAC PT "6 Clicks" Daily Activity  Outcome Measure  Difficulty turning over in bed (including adjusting bedclothes, sheets and blankets)?: None Difficulty moving from lying on back to sitting on the side of the bed? : None Difficulty sitting down on and standing up from a chair with arms (e.g., wheelchair, bedside commode, etc,.)?: Unable Help needed moving to and from a bed to chair (including a wheelchair)?: A Little Help needed walking in hospital room?: A Lot Help needed climbing 3-5 steps with a railing? : A Lot 6 Click Score: 16    End of Session Equipment Utilized During Treatment: Gait belt Activity Tolerance: Patient tolerated treatment well Patient left: with call bell/phone within reach;Other (comment)(sitting EOB with student nurse present) Nurse Communication: Mobility status PT Visit Diagnosis: Unsteadiness on feet (R26.81);Repeated falls (R29.6);Hemiplegia and hemiparesis Hemiplegia - Right/Left: Left Hemiplegia - dominant/non-dominant: Non-dominant Hemiplegia - caused by: Cerebral infarction     Time: 1021-1032 PT Time Calculation (min) (ACUTE ONLY): 11 min  Charges:  $Gait Training: 8-22 mins                     Deborah Chalk, PT, DPT  Acute Rehabilitation Services Pager [phone] Office [phone]     Alessandra Bevels Gwendy Boeder 05/10/2018, 10:58 AM

## 2018-05-12 LAB — CULTURE, BLOOD (ROUTINE X 2)
CULTURE: NO GROWTH
Culture: NO GROWTH
Special Requests: ADEQUATE
Special Requests: ADEQUATE

## 2019-03-09 ENCOUNTER — Other Ambulatory Visit: Payer: Self-pay

## 2019-03-09 DIAGNOSIS — Z20822 Contact with and (suspected) exposure to covid-19: Secondary | ICD-10-CM

## 2019-03-10 LAB — NOVEL CORONAVIRUS, NAA: SARS-CoV-2, NAA: NOT DETECTED

## 2019-05-10 IMAGING — DX DG ABD PORTABLE 1V
1 series · 1 of 1 positions shown · non-contrast
Comparison: 01/13/2018

CLINICAL DATA: Abdominal pain. Follow-up dilated bowel loops.
Nasogastric tube placement.

EXAM:
PORTABLE ABDOMEN - 1 VIEW

[abdomen]
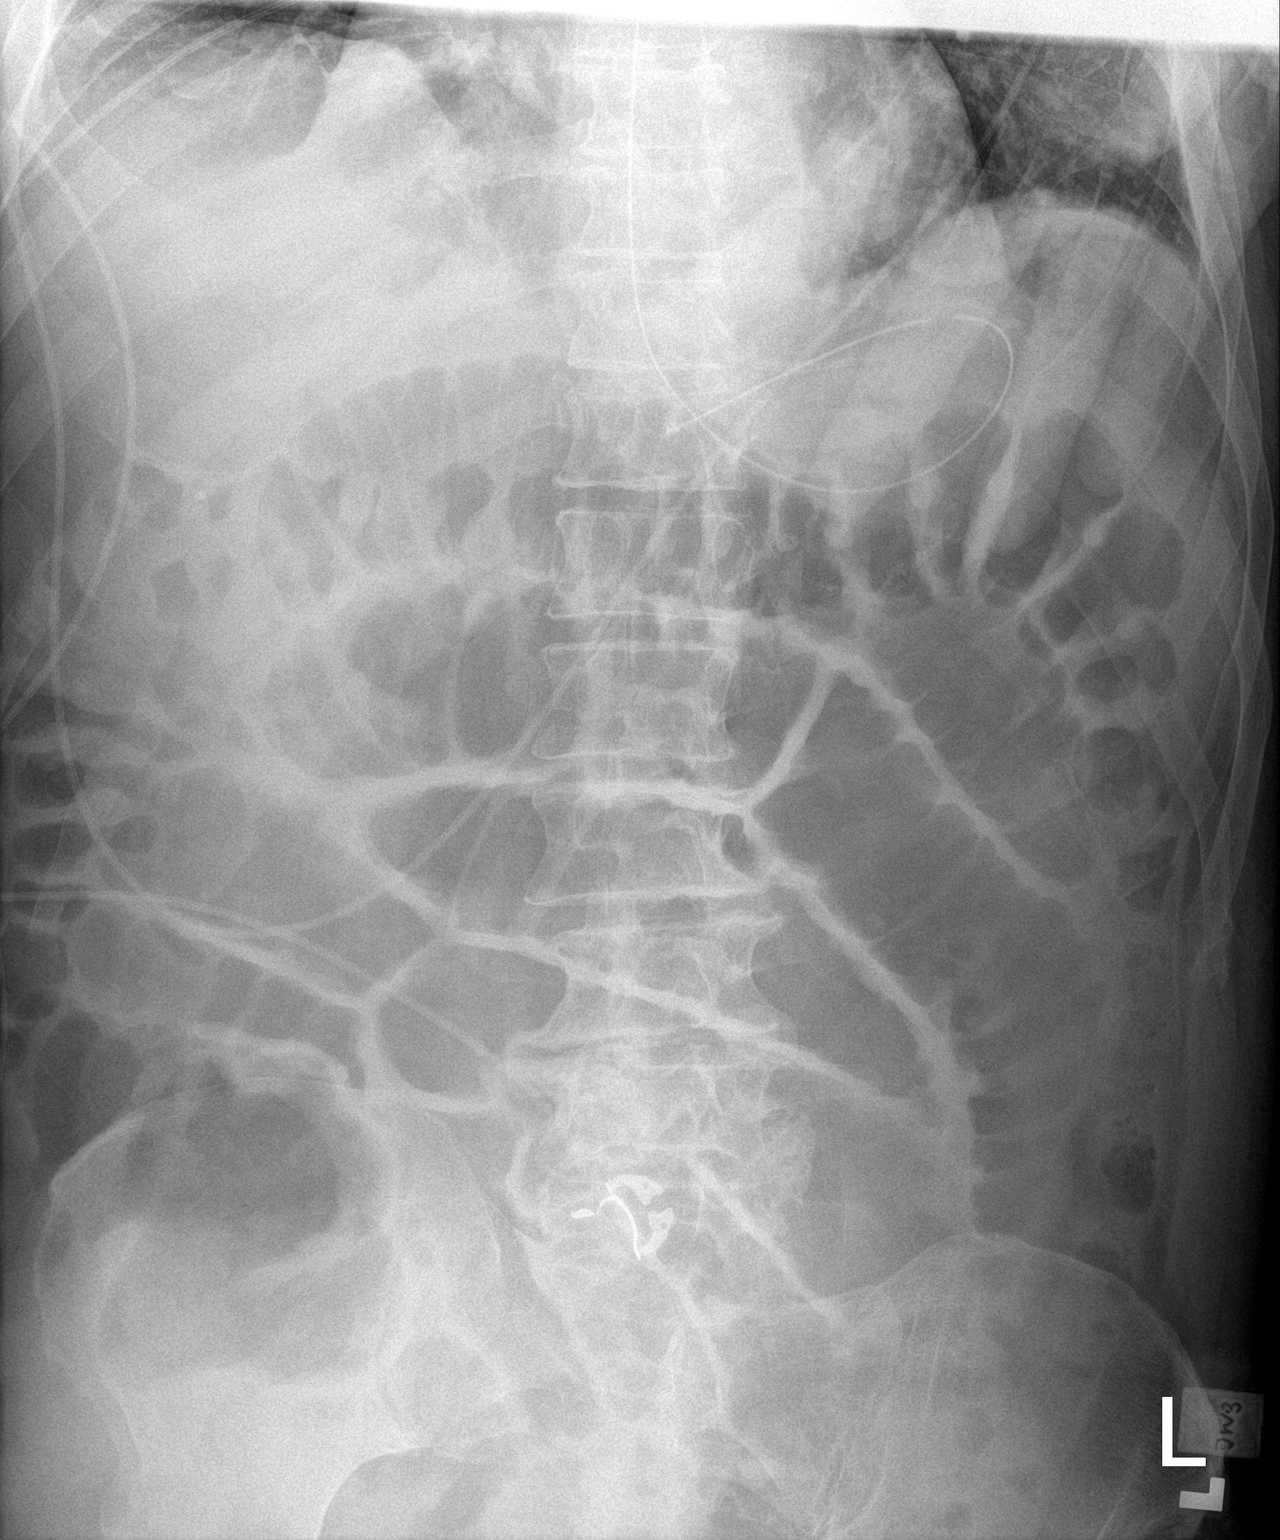

[1 of 1 positions shown; findings below may reference images not displayed]

FINDINGS: A nasogastric tube is seen with tip overlying the proximal stomach.
Markedly dilated small bowel loops show no significant change. There
is some gas within nondilated colon. These findings are suspicious
for a distal small bowel obstruction.
IMPRESSION: Findings suspicious for distal small bowel obstruction, without
significant change.

Nasogastric tube tip overlies the proximal stomach.

## 2019-05-11 IMAGING — DX DG ABD PORTABLE 1V
2 series · 2 of 2 positions shown · non-contrast
Comparison: 01/14/2018

CLINICAL DATA: Small bowel obstruction.

EXAM:
PORTABLE ABDOMEN - 1 VIEW

[abdomen kub (1 of 2)]
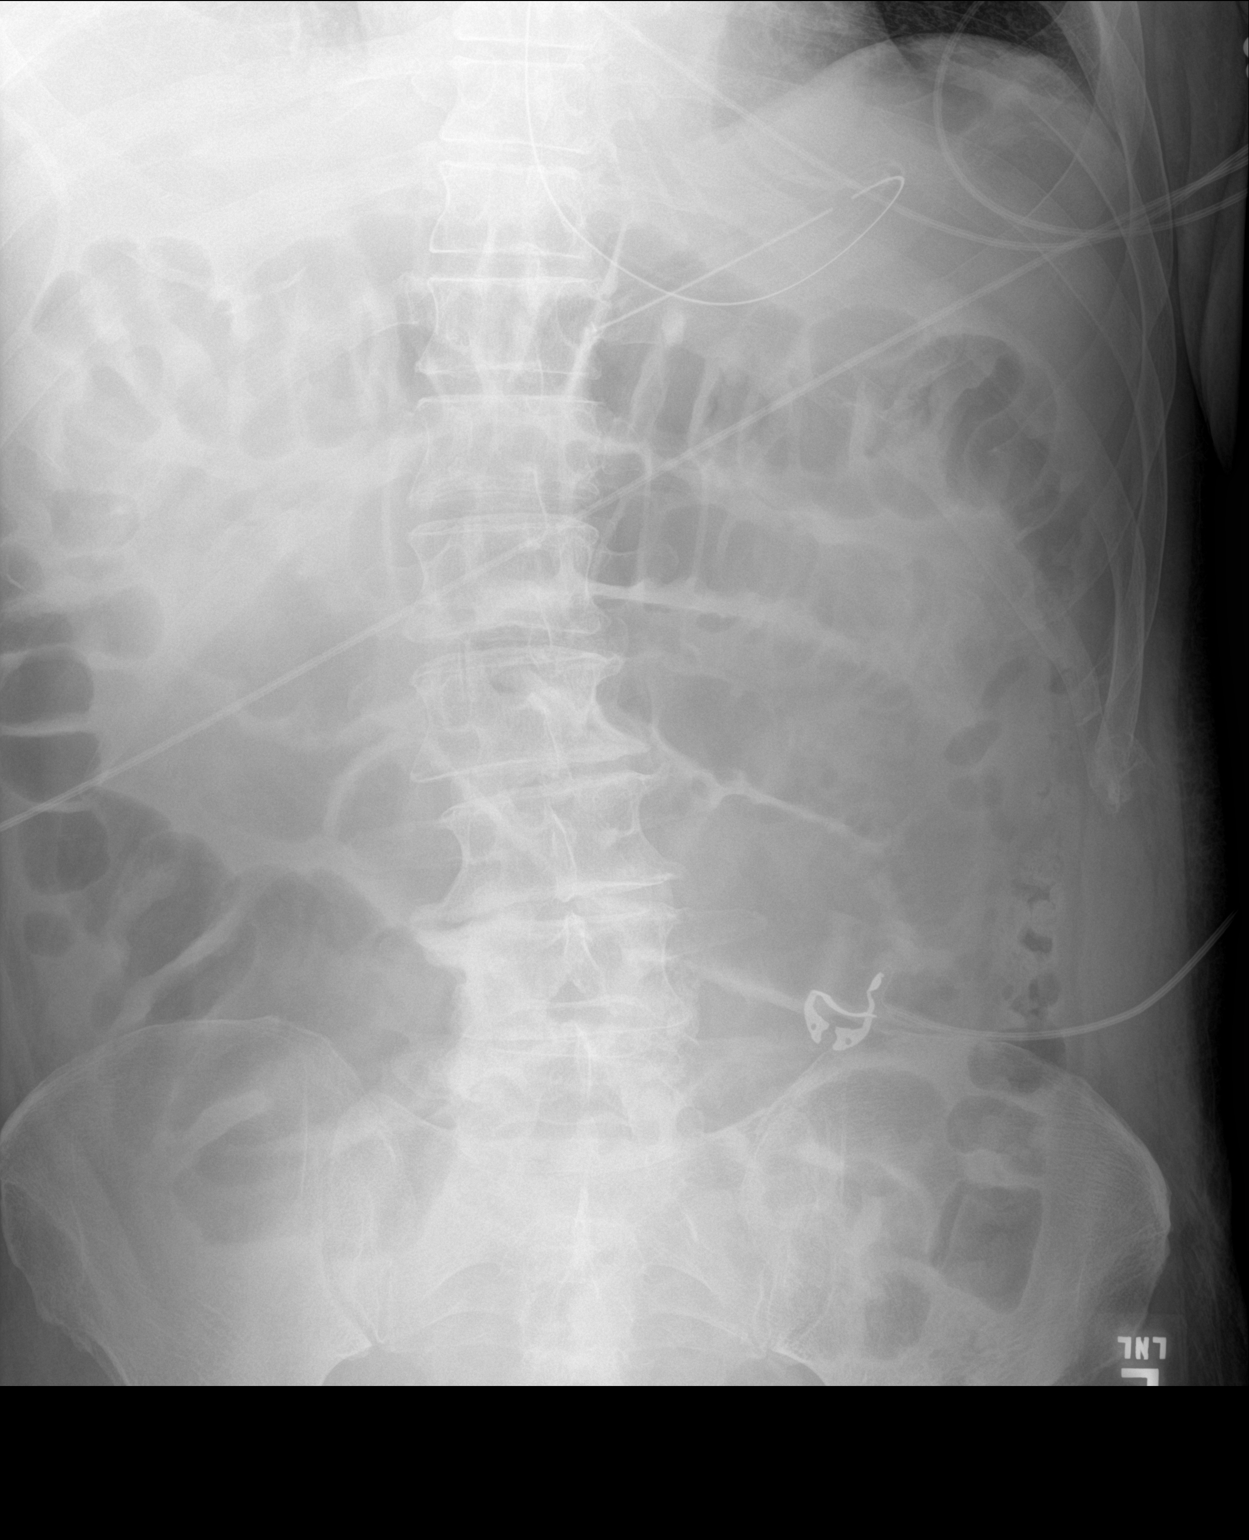

[abdomen kub (2 of 2)]
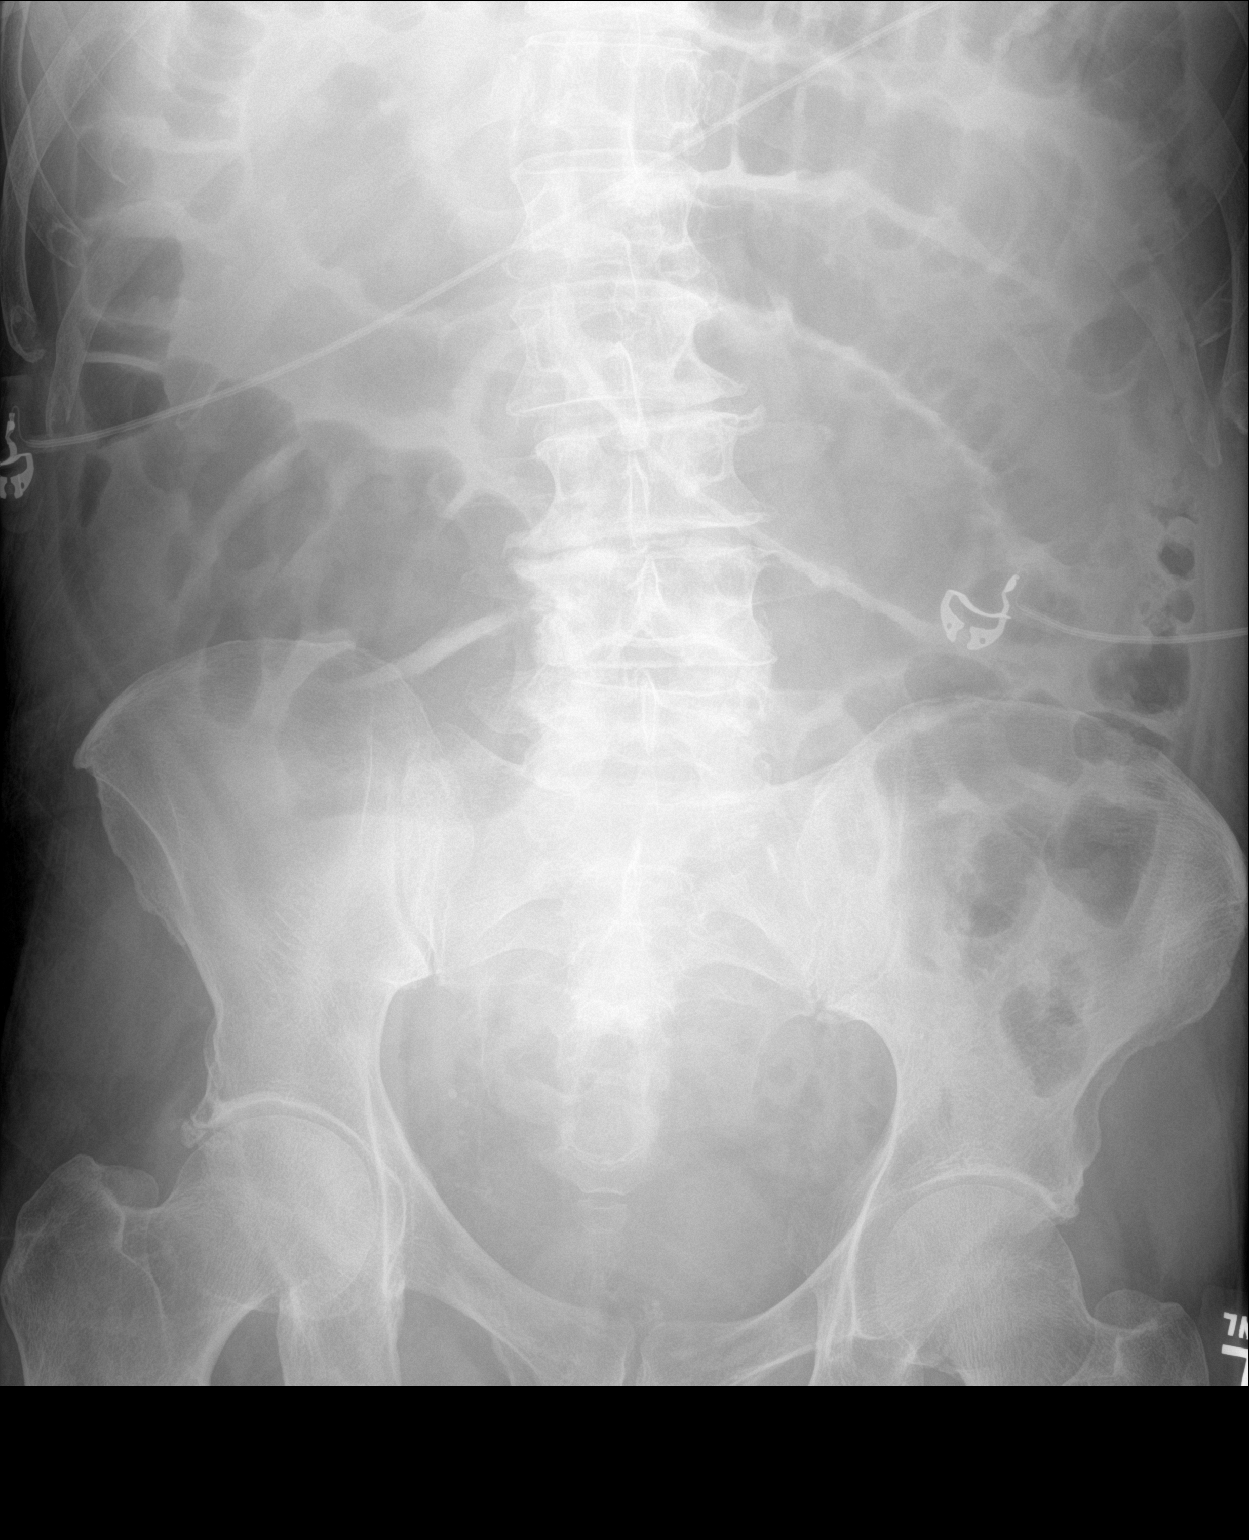

[2 of 2 positions shown; findings below may reference images not displayed]

FINDINGS: There is a nasogastric tube which is coiled in the left upper
quadrant of the abdomen with tip projecting over the body of
stomach. Gaseous distension of the large and small bowel loops are
again noted. Not significantly improved from previous exam. No
air-fluid levels identified.
IMPRESSION: 1. No change in the appearance of small bowel obstruction pattern.

## 2020-12-16 ENCOUNTER — Emergency Department (HOSPITAL_COMMUNITY): Payer: Medicare Other

## 2020-12-16 ENCOUNTER — Other Ambulatory Visit (HOSPITAL_COMMUNITY): Payer: Self-pay

## 2020-12-16 ENCOUNTER — Other Ambulatory Visit: Payer: Self-pay

## 2020-12-16 ENCOUNTER — Encounter (HOSPITAL_COMMUNITY): Payer: Self-pay

## 2020-12-16 ENCOUNTER — Observation Stay (HOSPITAL_COMMUNITY)
Admission: EM | Admit: 2020-12-16 | Discharge: 2020-12-18 | Disposition: A | Discharge status: Home / Self Care | Payer: Medicare Other | Attending: Internal Medicine | Admitting: Internal Medicine

## 2020-12-16 DIAGNOSIS — I2699 Other pulmonary embolism without acute cor pulmonale: Secondary | ICD-10-CM | POA: Diagnosis not present

## 2020-12-16 DIAGNOSIS — Z79899 Other long term (current) drug therapy: Secondary | ICD-10-CM | POA: Insufficient documentation

## 2020-12-16 DIAGNOSIS — Z87891 Personal history of nicotine dependence: Secondary | ICD-10-CM | POA: Diagnosis not present

## 2020-12-16 DIAGNOSIS — R0602 Shortness of breath: Secondary | ICD-10-CM | POA: Diagnosis present

## 2020-12-16 DIAGNOSIS — I5033 Acute on chronic diastolic (congestive) heart failure: Secondary | ICD-10-CM | POA: Insufficient documentation

## 2020-12-16 DIAGNOSIS — N3000 Acute cystitis without hematuria: Secondary | ICD-10-CM

## 2020-12-16 DIAGNOSIS — Z20822 Contact with and (suspected) exposure to covid-19: Secondary | ICD-10-CM | POA: Insufficient documentation

## 2020-12-16 DIAGNOSIS — E7800 Pure hypercholesterolemia, unspecified: Secondary | ICD-10-CM | POA: Insufficient documentation

## 2020-12-16 DIAGNOSIS — E785 Hyperlipidemia, unspecified: Secondary | ICD-10-CM | POA: Diagnosis present

## 2020-12-16 DIAGNOSIS — R2689 Other abnormalities of gait and mobility: Secondary | ICD-10-CM | POA: Insufficient documentation

## 2020-12-16 DIAGNOSIS — N400 Enlarged prostate without lower urinary tract symptoms: Secondary | ICD-10-CM | POA: Diagnosis not present

## 2020-12-16 DIAGNOSIS — I5032 Chronic diastolic (congestive) heart failure: Secondary | ICD-10-CM | POA: Diagnosis present

## 2020-12-16 DIAGNOSIS — I110 Hypertensive heart disease with heart failure: Secondary | ICD-10-CM | POA: Insufficient documentation

## 2020-12-16 DIAGNOSIS — Z7901 Long term (current) use of anticoagulants: Secondary | ICD-10-CM | POA: Diagnosis not present

## 2020-12-16 HISTORY — DX: Other pulmonary embolism without acute cor pulmonale: I26.99

## 2020-12-16 LAB — URINALYSIS, ROUTINE W REFLEX MICROSCOPIC
Bilirubin Urine: NEGATIVE
Glucose, UA: NEGATIVE mg/dL
Ketones, ur: NEGATIVE mg/dL
Nitrite: NEGATIVE
Protein, ur: 30 mg/dL — AB
Specific Gravity, Urine: 1.033 — ABNORMAL HIGH (ref 1.005–1.030)
pH: 6 (ref 5.0–8.0)

## 2020-12-16 LAB — CBC
HCT: 50.5 % (ref 39.0–52.0)
Hemoglobin: 16.4 g/dL (ref 13.0–17.0)
MCH: 29.7 pg (ref 26.0–34.0)
MCHC: 32.5 g/dL (ref 30.0–36.0)
MCV: 91.5 fL (ref 80.0–100.0)
Platelets: 180 10*3/uL (ref 150–400)
RBC: 5.52 MIL/uL (ref 4.22–5.81)
RDW: 13.2 % (ref 11.5–15.5)
WBC: 10.7 10*3/uL — ABNORMAL HIGH (ref 4.0–10.5)
nRBC: 0 % (ref 0.0–0.2)

## 2020-12-16 LAB — D-DIMER, QUANTITATIVE: D-Dimer, Quant: 8.79 ug/mL-FEU — ABNORMAL HIGH (ref 0.00–0.50)

## 2020-12-16 LAB — BASIC METABOLIC PANEL
Anion gap: 10 (ref 5–15)
BUN: 25 mg/dL — ABNORMAL HIGH (ref 8–23)
CO2: 22 mmol/L (ref 22–32)
Calcium: 9.5 mg/dL (ref 8.9–10.3)
Chloride: 107 mmol/L (ref 98–111)
Creatinine, Ser: 1.1 mg/dL (ref 0.61–1.24)
GFR, Estimated: >60 mL/min (ref >60)
Glucose, Bld: 175 mg/dL — ABNORMAL HIGH (ref 70–99)
Potassium: 3.6 mmol/L (ref 3.5–5.1)
Sodium: 139 mmol/L (ref 135–145)

## 2020-12-16 LAB — TROPONIN I (HIGH SENSITIVITY)
Troponin I (High Sensitivity): 11 ng/L (ref <18)
Troponin I (High Sensitivity): 13 ng/L (ref <18)

## 2020-12-16 LAB — SARS CORONAVIRUS 2 (TAT 6-24 HRS): SARS Coronavirus 2: NEGATIVE

## 2020-12-16 MED ORDER — RIVAROXABAN (XARELTO) VTE STARTER PACK (15 & 20 MG)
ORAL_TABLET | ORAL | 0 refills | Status: DC
  Filled 2020-12-16: qty 51, 30d supply, fill #0

## 2020-12-16 MED ORDER — SODIUM CHLORIDE 0.9% FLUSH
3.0000 mL | Freq: Two times a day (BID) | INTRAVENOUS | Status: DC
  Administered 2020-12-16 – 2020-12-17 (×3): 3 mL via INTRAVENOUS

## 2020-12-16 MED ORDER — ONDANSETRON HCL 4 MG/2ML IJ SOLN: 4.0000 mg | Freq: Four times a day (QID) | INTRAMUSCULAR | Status: DC | PRN

## 2020-12-16 MED ORDER — ROSUVASTATIN CALCIUM 5 MG PO TABS
10.0000 mg | ORAL_TABLET | Freq: Every day | ORAL | Status: DC
  Administered 2020-12-17 – 2020-12-18 (×2): 10 mg via ORAL
  Filled 2020-12-16 (×3): qty 2

## 2020-12-16 MED ORDER — ONDANSETRON HCL 4 MG PO TABS: 4.0000 mg | ORAL_TABLET | Freq: Four times a day (QID) | ORAL | Status: DC | PRN

## 2020-12-16 MED ORDER — TAMSULOSIN HCL 0.4 MG PO CAPS
0.4000 mg | ORAL_CAPSULE | Freq: Every day | ORAL | Status: DC
  Administered 2020-12-16 – 2020-12-17 (×2): 0.4 mg via ORAL
  Filled 2020-12-16 (×2): qty 1

## 2020-12-16 MED ORDER — HYDROCODONE-ACETAMINOPHEN 5-325 MG PO TABS
1.0000 | ORAL_TABLET | ORAL | Status: DC | PRN
Start: 2020-12-16 — End: 2020-12-18
  Filled 2020-12-16: qty 2

## 2020-12-16 MED ORDER — SODIUM CHLORIDE 0.9 % IV SOLN
1.0000 g | Freq: Once | INTRAVENOUS | Status: AC
  Administered 2020-12-16: 1 g via INTRAVENOUS
  Filled 2020-12-16: qty 1

## 2020-12-16 MED ORDER — RIVAROXABAN 15 MG PO TABS
15.0000 mg | ORAL_TABLET | Freq: Once | ORAL | Status: DC
  Filled 2020-12-16: qty 1

## 2020-12-16 MED ORDER — RIVAROXABAN (XARELTO) EDUCATION KIT FOR DVT/PE PATIENTS
PACK | Freq: Once | Status: DC
  Filled 2020-12-16: qty 1

## 2020-12-16 MED ORDER — HYDRALAZINE HCL 20 MG/ML IJ SOLN: 5.0000 mg | INTRAMUSCULAR | Status: DC | PRN

## 2020-12-16 MED ORDER — MORPHINE SULFATE (PF) 2 MG/ML IV SOLN: 2.0000 mg | INTRAVENOUS | Status: DC | PRN

## 2020-12-16 MED ORDER — HEPARIN BOLUS VIA INFUSION
4000.0000 [IU] | Freq: Once | INTRAVENOUS | Status: AC
  Administered 2020-12-16: 4000 [IU] via INTRAVENOUS
  Filled 2020-12-16: qty 4000

## 2020-12-16 MED ORDER — HEPARIN (PORCINE) 25000 UT/250ML-% IV SOLN
1400.0000 [IU]/h | INTRAVENOUS | Status: AC
  Administered 2020-12-16 – 2020-12-17 (×2): 1400 [IU]/h via INTRAVENOUS
  Filled 2020-12-16 (×2): qty 250

## 2020-12-16 MED ORDER — ACETAMINOPHEN 325 MG PO TABS
650.0000 mg | ORAL_TABLET | Freq: Four times a day (QID) | ORAL | Status: DC | PRN
  Administered 2020-12-17: 650 mg via ORAL
  Filled 2020-12-16: qty 2

## 2020-12-16 MED ORDER — DOCUSATE SODIUM 100 MG PO CAPS
100.0000 mg | ORAL_CAPSULE | Freq: Two times a day (BID) | ORAL | Status: DC
  Administered 2020-12-16 – 2020-12-18 (×4): 100 mg via ORAL
  Filled 2020-12-16 (×5): qty 1

## 2020-12-16 MED ORDER — ACETAMINOPHEN 650 MG RE SUPP: 650.0000 mg | Freq: Four times a day (QID) | RECTAL | Status: DC | PRN

## 2020-12-16 MED ORDER — BISACODYL 5 MG PO TBEC: 5.0000 mg | DELAYED_RELEASE_TABLET | Freq: Every day | ORAL | Status: DC | PRN

## 2020-12-16 MED ORDER — LACTATED RINGERS IV SOLN: INTRAVENOUS | Status: DC

## 2020-12-16 MED ORDER — POLYETHYLENE GLYCOL 3350 17 G PO PACK: 17.0000 g | PACK | Freq: Every day | ORAL | Status: DC | PRN

## 2020-12-16 MED ORDER — IOHEXOL 350 MG/ML SOLN
100.0000 mL | Freq: Once | INTRAVENOUS | Status: AC | PRN
  Administered 2020-12-16: 100 mL via INTRAVENOUS

## 2020-12-16 NOTE — Progress Notes (Signed)
ANTICOAGULATION CONSULT NOTE - Initial Consult  Pharmacy Consult for heparin  Indication: pulmonary embolus  No Known Allergies  Patient Measurements:   Heparin Dosing Weight: 85KG  Vital Signs: Temp: 98.6 F (37 C) (05/11 0852) Temp Source: Oral (05/11 0852) BP: 134/94 (05/11 1330) Pulse Rate: 94 (05/11 1330)  Labs: Recent Labs    12/16/20 1016 12/16/20 1130  HGB 16.4  --   HCT 50.5  --   PLT 180  --   CREATININE 1.10  --   TROPONINIHS 11 13    CrCl cannot be calculated (Unknown ideal weight.).   Medical History: Past Medical History:  Diagnosis Date  . BPH (benign prostatic hyperplasia)   . Chronic diastolic CHF (congestive heart failure) (HCC) 01/16/2018  . CVA (cerebral vascular accident) (HCC) 1984   left side affected.    . Hypercholesterolemia   . SBO (small bowel obstruction) (HCC) 04/2018    Medications:    Assessment: 50 yom with PMH significant for SBO, HLD, and PE in 2019 treated with Xarelto. Patient presents today with complaint of SOB. Patient is not currently on [facility]. Most recent Hgb 16.4 and Plt 180. Patient unable to recall most recent weight.   Goal of Therapy:  Heparin level 0.3-0.7 units/ml Monitor platelets by anticoagulation protocol: Yes   Plan:  Heparin 4000 units x1 followed by 1400 units/hr  8- hour heparin level  Daily HL and CBC  Monitor s/sx of bleed   Jason Myers, PharmD, MBA Pharmacy Resident [phone] 12/16/2020 4:00 PM

## 2020-12-16 NOTE — ED Provider Notes (Signed)
[facility] EMERGENCY DEPARTMENT Provider Note   CSN: [id number] Arrival date & time: 12/16/20  0848     History Chief Complaint  Patient presents with  . Shortness of Breath    Jason Myers is a 70 y.o. male here with sob and light headedness. He has a pmh of previous PE, patient states that he is no longer taking Xarelto or any other anticoagulation.  He has a past medical history of previous CVA with left hemispasticity.  He is ambulatory.  He states that he has a mostly sedentary lifestyle.  Patient states that he was in his normal state of health and about an hour into his day around 8 AM he had just stood up, felt very lightheaded and then simultaneously began feeling very short of breath.  He states that this feels similar to his previous pulmonary embolus.  He is unsure why he had the previous pulmonary embolus.  He states that he did not have any chest pain.  Symptoms were not exertional.  He has no active shortness of breath or feelings of near syncope at this time.  He denies melena or hematochezia.  He did notice some "ropey veins" in his left calf yesterday.  He is also been having some urinary symptoms with frequency and urgency to urinate.  He denies any flank pain, fever or abdominal pain.  HPI     Past Medical History:  Diagnosis Date  . BPH (benign prostatic hyperplasia)   . Chronic diastolic CHF (congestive heart failure) (HCC) 01/16/2018  . CVA (cerebral vascular accident) (HCC) 1984   left side affected.    . Hypercholesterolemia   . SBO (small bowel obstruction) (HCC) 04/2018    Patient Active Problem List   Diagnosis Date Noted  . Abdominal pain 05/07/2018  . Acute lower UTI 05/07/2018  . SBO (small bowel obstruction) (HCC) 05/07/2018  . Chronic diastolic CHF (congestive heart failure) (HCC) 01/16/2018  . Acute on chronic diastolic CHF (congestive heart failure) (HCC) 01/16/2018  . Hypoxia 01/09/2018  . AKI (acute kidney injury) (HCC)  01/09/2018  . Hypotension 01/09/2018  . PE (pulmonary thromboembolism) (HCC) 11/13/2016  . BPH (benign prostatic hyperplasia) 11/13/2016  . Thyroid nodule 11/13/2016  . HLD (hyperlipidemia) 06/08/2007  . HEMORRHOIDS 06/08/2007    Past Surgical History:  Procedure Laterality Date  . LIPOMA EXCISION Left    groin       Family History  Problem Relation Age of Onset  . Alcoholism Mother   . Kidney Stones Father     Social History   Tobacco Use  . Smoking status: Former Games developer  . Smokeless tobacco: Never Used  Vaping Use  . Vaping Use: Never used  Substance Use Topics  . Alcohol use: Yes    Comment: occasionally  . Drug use: Yes    Types: Marijuana    Home Medications Prior to Admission medications   Medication Sig Start Date End Date Taking? Authorizing Provider  rivaroxaban (XARELTO) 20 MG TABS tablet Take 1 tablet (20 mg total) by mouth daily with supper. 05/10/18   Darlin Drop, DO  rosuvastatin (CRESTOR) 10 MG tablet Take 10 mg by mouth daily. 09/28/16   [provider]  tamsulosin (FLOMAX) 0.4 MG CAPS capsule Take 0.4 mg by mouth at bedtime.  09/28/16   [provider]    Allergies    Patient has no known allergies.  Review of Systems   Review of Systems Ten systems reviewed and are negative for  acute change, except as noted in the HPI.   Physical Exam Updated Vital Signs BP 136/89 (BP Location: Right Arm)   Pulse (!) 56   Temp 98.6 F (37 C) (Oral)   Resp 18   SpO2 95%   Physical Exam Vitals and nursing note reviewed.  Constitutional:      General: He is not in acute distress.    Appearance: He is well-developed. He is not diaphoretic.  HENT:     Head: Normocephalic and atraumatic.  Eyes:     General: No scleral icterus.    Extraocular Movements: Extraocular movements intact.     Conjunctiva/sclera: Conjunctivae normal.  Cardiovascular:     Rate and Rhythm: Regular rhythm. Tachycardia present.     Pulses:          Dorsalis  pedis pulses are 2+ on the right side and 2+ on the left side.       Posterior tibial pulses are 2+ on the right side and 2+ on the left side.     Heart sounds: Normal heart sounds.  Pulmonary:     Effort: Pulmonary effort is normal. No tachypnea or respiratory distress.     Breath sounds: Normal breath sounds. No rhonchi or rales.  Abdominal:     Palpations: Abdomen is soft.     Tenderness: There is no abdominal tenderness.  Musculoskeletal:     Cervical back: Normal range of motion and neck supple.     Right lower leg: No tenderness. No edema.     Left lower leg: No tenderness. No edema.  Skin:    General: Skin is warm and dry.  Neurological:     Mental Status: He is alert.     Motor: Weakness, atrophy and abnormal muscle tone present.     Comments: Left sided hemispasticity with atrophy  Psychiatric:        Behavior: Behavior normal.     ED Results / Procedures / Treatments   Labs (all labs ordered are listed, but only abnormal results are displayed) Labs Reviewed  BASIC METABOLIC PANEL  CBC  D-DIMER, QUANTITATIVE  TROPONIN I (HIGH SENSITIVITY)    EKG EKG Interpretation  Date/Time:  Wednesday Dec 16 2020 08:59:03 EDT Ventricular Rate:  110 PR Interval:  128 QRS Duration: 90 QT Interval:  344 QTC Calculation: 465 R Axis:   -3 Text Interpretation: Sinus tachycardia with occasional and consecutive Premature ventricular complexes Possible Left atrial enlargement Minimal voltage criteria for LVH, may be normal variant ( R in aVL ) Abnormal ECG Confirmed by Jacalyn Lefevre [phone]) on 12/16/2020 9:09:59 AM   Radiology No results found.  Procedures .Critical Care Performed by: Arthor Captain, PA-C Authorized by: Arthor Captain, PA-C   Critical care provider statement:    Critical care time (minutes):  45   Critical care time was exclusive of:  Separately billable procedures and treating other patients   Critical care was necessary to treat or prevent imminent or  life-threatening deterioration of the following conditions:  Circulatory failure and respiratory failure   Critical care was time spent personally by me on the following activities:  Discussions with consultants, evaluation of patient's response to treatment, examination of patient, ordering and performing treatments and interventions, ordering and review of laboratory studies, ordering and review of radiographic studies, pulse oximetry, re-evaluation of patient's condition, obtaining history from patient or surrogate and review of old charts    Medications Ordered in ED Medications - No data to display  ED Course  I  have reviewed the triage vital signs and the nursing notes.  Pertinent labs & imaging results that were available during my care of the patient were reviewed by me and considered in my medical decision making (see chart for details).  Clinical Course as of 12/16/20 1658  Wed Dec 16, 2020  0100 Dimer is positive, will obtain CTA  [AH]  1448 Resp(!): 30 [AH]  1448 PESI score 111 high risk [AH]    Clinical Course User Index [AH] Arthor Captain, PA-C   MDM Rules/Calculators/A&P                          This is a 70 year old male here in the emergency department with a chief complaint of lightheadedness, near syncope earlier today.The differential for syncope is extensive and includes, but is not limited to: arrythmia (Vtach, SVT, SSS, sinus arrest, AV block, bradycardia) aortic stenosis, AMI, HOCM, PE, atrial myxoma, pulmonary hypertension, orthostatic hypotension, (hypovolemia, drug effect, GB syndrome, micturition, cough, swall) carotid sinus sensitivity, Seizure, TIA/CVA, hypoglycemia,  Vertigo. He has a history of previous pulmonary embolus.  I ordered and reviewed labs that include CBC with elevated white blood cell count, BMP with mildly elevated glucose.  D-dimer is elevated at 8.79, troponin within normal limits, patient's urine appears to be infected.  I have ordered  Rocephin.  Patient has a high risk Pasi score at 111 points.  He will need admission.  I have given over care to PA Caccavale who will admit the patient.  Other than elevated respiratory rate he is otherwise stable without hypotension.  No evidence of cor pulmonale on CT imaging. Final Clinical Impression(s) / ED Diagnoses Final diagnoses:  None    Rx / DC Orders ED Discharge Orders    None       Arthor Captain, PA-C 12/16/20 1704    Jacalyn Lefevre, MD 12/18/20 1510

## 2020-12-16 NOTE — H&P (Signed)
History and Physical    Jason Myers YQI:[id number] DOB: 1951-07-21 DOA: 12/16/2020  PCP: [facility] Consultants:  None Patient coming from:  Home - lives alone; NOK: Felisa Bonier [place], [phone]   Chief Complaint: SOB  HPI: Jason Myers is a 70 y.o. 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  He got up this AM and wasn't sure what was wrong.  He was woozy, mildly SOB, had difficulty concentrating while trying to read.  He was concerned about possible recurrent PE - "and I was right."  Prior PE was in 2018 and 2019.  He was not told that he would need lifetime blood thinners, stopped in 2019.  He was discharged on 2 weeks of Xarelto, "thought the condition was stabilized, silly me", and never followed up.  He is "extremely sedentary" - more than usual now because he is fascinated with politics ([place] ,Roe vs. Thur[employer]).    ED Course:  PE.  Has h/o VTE, not currently on AC.  Mild tachypnea.  Not on O2, resting comfortably.  Review of Systems: As per HPI; otherwise review of systems reviewed and negative.   Ambulatory Status:  Ambulates with a cane  COVID Vaccine Status:  Complete plus booster  Past Medical History:  Diagnosis Date  . BPH (benign prostatic hyperplasia)   . Chronic diastolic CHF (congestive heart failure) (HCC) 01/16/2018  . CVA (cerebral vascular accident) (HCC) 1984   left side affected.    . Hypercholesterolemia   . Pulmonary embolism (HCC)    2018, 2019, 2022  . SBO (small bowel obstruction) (HCC) 04/2018    Past Surgical History:  Procedure Laterality Date  . LIPOMA EXCISION Left    groin    Social History   Socioeconomic History  . Marital status: Single    Spouse name: Not on file  . Number of children: Not on file  . Years of education: Not on file  . Highest education level: Not on file  Occupational History  .  Occupation: retired  Tobacco Use  . Smoking status: Former Games developer  . Smokeless tobacco: Never Used  . Tobacco comment: light smoking  Vaping Use  . Vaping Use: Never used  Substance and Sexual Activity  . Alcohol use: Yes    Comment: occasionally  . Drug use: Yes    Types: Marijuana    Comment: daily  . Sexual activity: Not on file  Other Topics Concern  . Not on file  Social History Narrative  . Not on file   Social Determinants of Health   Financial Resource Strain: Not on file  Food Insecurity: Not on file  Transportation Needs: Not on file  Physical Activity: Not on file  Stress: Not on file  Social Connections: Not on file  Intimate Partner Violence: Not on file    No Known Allergies  Family History  Problem Relation Age of Onset  . Alcoholism Mother   . Kidney Stones Father     Prior to Admission medications   Medication Sig Start Date End Date Taking? Authorizing Provider  Apoaequorin (PREVAGEN PO) Take 1 tablet by mouth daily.   Yes [provider]  multivitamin-iron-minerals-folic acid (CENTRUM) chewable tablet Chew 1 tablet by mouth daily.   Yes [provider]  naproxen sodium (ALEVE) 220 MG tablet Take 440 mg by mouth 2 (two) times daily as needed (back pain).  Yes [provider]  RIVAROXABAN Carlena Hurl(XARELTO) VTE STARTER PACK (15 & 20 MG) Follow package directions: Take one 15mg  tablet by mouth twice a day. On day 22, switch to one 20mg  tablet once a day. Take with food. 12/16/20  Yes Jacalyn LefevreHaviland, Julie, MD  rosuvastatin (CRESTOR) 10 MG tablet Take 10 mg by mouth daily. 09/28/16  Yes [provider]  tamsulosin (FLOMAX) 0.4 MG CAPS capsule Take 0.4 mg by mouth at bedtime.  09/28/16  Yes [provider]    Physical Exam: Vitals:   12/16/20 1215 12/16/20 1230 12/16/20 1300 12/16/20 1330  BP: (!) 144/103 (!) 126/104 (!) 140/101 (!) 134/94  Pulse: 93 87 93 94  Resp: (!) 28 (!) 29 (!) 31 (!) 30  Temp:      TempSrc:       SpO2: 97% 97% 95% 95%     . General:  Appears calm and comfortable and is in NAD . Eyes:  PERRL, EOMI, normal lids, iris . ENT:  grossly normal hearing, lips & tongue, mmm; suboptimal dentition . Neck:  no LAD, masses or thyromegaly . Cardiovascular:  RRR, no m/r/g. No LE edema.  Marland Kitchen. Respiratory:   CTA bilaterally with no wheezes/rales/rhonchi.  Normal respiratory effort. . Abdomen:  soft, NT, ND . Skin:  no rash or induration seen on limited exam . Musculoskeletal:  L hemiparesis, UE > LE . Lower extremity:  No LE edema.  Limited foot exam with no ulcerations.  2+ distal pulses.  LLE appears to be mildly larger than R and with cords along calf . Psychiatric:  Eccentric mood and affect, speech fluent and appropriate, AOx3 . Neurologic:  CN 2-12 grossly intact, moves R extremities in coordinated fashion    Radiological Exams on Admission: Independently reviewed - see discussion in A/P where applicable  DG Chest 2 View  Result Date: 12/16/2020 CLINICAL DATA:  Chest pain EXAM: CHEST - 2 VIEW COMPARISON:  January 08, 2018 FINDINGS: Lungs are clear. Heart size and pulmonary vascularity are normal. No adenopathy. No pneumothorax. No bone lesions. IMPRESSION: Lungs clear.  Cardiac silhouette within normal limits. Electronically Signed   By: Bretta BangWilliam  Woodruff III M.D.   On: 12/16/2020 10:03   CT Angio Chest PE W and/or Wo Contrast  Result Date: 12/16/2020 CLINICAL DATA:  Worsening shortness of breath. EXAM: CT ANGIOGRAPHY CHEST WITH CONTRAST TECHNIQUE: Multidetector CT imaging of the chest was performed using the standard protocol during bolus administration of intravenous contrast. Multiplanar CT image reconstructions and MIPs were obtained to evaluate the vascular anatomy. CONTRAST:  100mL OMNIPAQUE IOHEXOL 350 MG/ML SOLN COMPARISON:  Plain film of earlier today.  CT chest 05/07/2018. FINDINGS: Cardiovascular: Chronic right lower lobe lobar to segmental pulmonary embolism, including on 230/7. New,  likely acute/subacute filling defects within right upper lobe lobar and segmental pulmonary artery branches, including on 120 through 142 of series 7. This originates in the distal most main right pulmonary artery including on 164/7. Normal heart size, without pericardial effusion. No evidence of right heart strain. Aortic atherosclerosis.  Tortuous thoracic aorta. Mediastinum/Nodes: Right-sided thyroid enlargement and heterogeneity is similar to 01/09/2018, favoring multinodular goiter. This was biopsied on 11/15/2016. No mediastinal or hilar adenopathy.  Tiny hiatal hernia. Lungs/Pleura: No pleural fluid. Left greater than right base dependent atelectasis. Upper Abdomen: Normal imaged portions of the liver, spleen, pancreas, gallbladder, adrenal glands. Exophytic upper pole left renal 10.10 cm cyst is incompletely imaged but was present back in 2019. Musculoskeletal: No acute osseous abnormality. Review of the MIP  images confirms the above findings. IMPRESSION: 1. Acute on chronic right-sided pulmonary embolism involving the right upper lobe, as detailed above. No evidence of right heart strain. 2.  Aortic Atherosclerosis (ICD10-I70.0). 3. Incompletely imaged left renal cyst. 4.  Tiny hiatal hernia. This was called to Arthor Captain at 2:34 p.m. Electronically Signed   By: Jeronimo Greaves M.D.   On: 12/16/2020 14:35    EKG: Independently reviewed.  NSR with rate 110; nonspecific ST changes with no evidence of acute ischemia   Labs on Admission: I have personally reviewed the available labs and imaging studies at the time of the admission.  Pertinent labs:   Glucose 175 HS troponin 11, 13 WBC 10.7 D-dimer 8.79   Assessment/Plan Principal Problem:   Acute pulmonary embolism without acute cor pulmonale (HCC) Active Problems:   HLD (hyperlipidemia)   BPH (benign prostatic hyperplasia)   Chronic diastolic CHF (congestive heart failure) (HCC)   Recurrent PE -Patient with 2 prior episodes of  thromboembolic disease presenting with new PE -He reports lack of awareness regarding need for ongoing AC after prior clot in 2019 and so only took Xarelto for 2 weeks (until supply ran out) -Also did not apparently f/u about this issue with PCP -Will observe on telemetry overnight, likely able to resume PO AC tomorrow and d/c to home if he remains hemodynamically stable and off AC -No R heart strain on CT -Initiate anticoagulation - for now, will start treatment-dose heparin -He is likely able to transition to alternative [facility] agent tomorrow - O2 not currently needed -We discussed the need for lifelong AC -Smoking cessation (uses marijuana, not tobacco) has been strongly encouraged -The patient understands that thromboembolic disease can be catastrophic and even deadly and that he must be complaint with physician appointments and lifelong anticoagulation. 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 The patient has been fully vaccinated against COVID-19.   Level of care: Telemetry Medical DVT prophylaxis: Heparin Code Status:  Full - confirmed with patient Family Communication: None present Disposition Plan:  The patient is from: home  Anticipated d/c is to: home, possibly with [facility] services   Anticipated d/c date will depend on clinical response to treatment, but possibly as early as tomorrow if he has excellent response to treatment  Patient is currently: acutely ill Consults called: PT/OT; TOC team Admission status:  It  is my clinical opinion that referral for OBSERVATION is reasonable and necessary in this patient based on the above information provided. The aforementioned taken together are felt to place the patient at high risk for further clinical deterioration. However it is anticipated that the patient may be medically stable for discharge from the hospital within 24 to 48 hours.    Jonah Blue MD [facility]   How to contact the [facility] Attending or Consulting provider 7A - 7P or covering provider during after hours 7P -7A, for this patient?  1. Check the care team in [facility] and look for a) attending/consulting [facility] provider listed and b) the [facility] team listed 2. Log into [email or web address] and  use [place]'s universal password to access. If you do not have the password, please contact the hospital operator. 3. Locate the [facility] provider you are looking for under [facility] and page to a number that you can be directly reached. 4. If you still have difficulty reaching the provider, please page the [facility] (Director on Call) for the Hospitalists listed on amion for assistance.   12/16/2020, 4:44 PM

## 2020-12-16 NOTE — ED Triage Notes (Signed)
Pt reports for the past couple day he has been having sob, cp. Pt reports last time he felt like this he had a PE.Pt reports 8/10cp.

## 2020-12-16 NOTE — ED Notes (Signed)
Pt given Ginger Ale per provider OK

## 2020-12-16 NOTE — ED Provider Notes (Signed)
Pt signed out to me by A Harris, PA-C. Please see previous notes for further history.  In brief, pt pending admission for PE. PESI score class 4, high risk. He is not on O2. Has a R lobe PE without heart strain.   Discussed with Dr. Ophelia Charter from [facility] service, pt to be admitted.    Alveria Apley, PA-C 12/16/20 1621    Tegeler, Canary Brim, MD 12/16/20 1816

## 2020-12-17 ENCOUNTER — Other Ambulatory Visit (HOSPITAL_COMMUNITY): Payer: Self-pay

## 2020-12-17 DIAGNOSIS — N3000 Acute cystitis without hematuria: Secondary | ICD-10-CM | POA: Diagnosis not present

## 2020-12-17 DIAGNOSIS — N400 Enlarged prostate without lower urinary tract symptoms: Secondary | ICD-10-CM | POA: Diagnosis not present

## 2020-12-17 DIAGNOSIS — I2699 Other pulmonary embolism without acute cor pulmonale: Secondary | ICD-10-CM | POA: Diagnosis not present

## 2020-12-17 LAB — CBC
HCT: 46.3 % (ref 39.0–52.0)
Hemoglobin: 15.7 g/dL (ref 13.0–17.0)
MCH: 30.3 pg (ref 26.0–34.0)
MCHC: 33.9 g/dL (ref 30.0–36.0)
MCV: 89.4 fL (ref 80.0–100.0)
Platelets: 158 10*3/uL (ref 150–400)
RBC: 5.18 MIL/uL (ref 4.22–5.81)
RDW: 13.3 % (ref 11.5–15.5)
WBC: 12.7 10*3/uL — ABNORMAL HIGH (ref 4.0–10.5)
nRBC: 0 % (ref 0.0–0.2)

## 2020-12-17 LAB — BASIC METABOLIC PANEL
Anion gap: 9 (ref 5–15)
BUN: 20 mg/dL (ref 8–23)
CO2: 22 mmol/L (ref 22–32)
Calcium: 8.8 mg/dL — ABNORMAL LOW (ref 8.9–10.3)
Chloride: 105 mmol/L (ref 98–111)
Creatinine, Ser: 0.99 mg/dL (ref 0.61–1.24)
GFR, Estimated: >60 mL/min (ref >60)
Glucose, Bld: 113 mg/dL — ABNORMAL HIGH (ref 70–99)
Potassium: 4 mmol/L (ref 3.5–5.1)
Sodium: 136 mmol/L (ref 135–145)

## 2020-12-17 LAB — HEPARIN LEVEL (UNFRACTIONATED)
Heparin Unfractionated: 0.37 IU/mL (ref 0.30–0.70)
Heparin Unfractionated: 0.17 IU/mL — ABNORMAL LOW (ref 0.30–0.70)

## 2020-12-17 MED ORDER — APIXABAN 5 MG PO TABS
10.0000 mg | ORAL_TABLET | Freq: Two times a day (BID) | ORAL | Status: DC
  Administered 2020-12-17 – 2020-12-18 (×3): 10 mg via ORAL
  Filled 2020-12-17 (×3): qty 2

## 2020-12-17 MED ORDER — APIXABAN 5 MG PO TABS: 5.0000 mg | ORAL_TABLET | Freq: Two times a day (BID) | ORAL | Status: DC

## 2020-12-17 NOTE — Progress Notes (Signed)
PT Cancellation Note  Patient Details Name: Jason Myers MRN: [id number] DOB: [birth year]   Cancelled Treatment:    Reason Eval/Treat Not Completed: Patient not medically ready. Patient not medically ready. Patient with new PE and heparin initiated on 5/11 at 1638. Per protocol - therapy evaluation held for a full 24 hrs. OT will f/u as able.   Ginette Otto, DPT Acute Rehabilitation Services [phone]   Lucretia Field 12/17/2020, 9:28 AM

## 2020-12-17 NOTE — Progress Notes (Signed)
OT Cancellation Note  Patient Details Name: Jason Myers MRN: [id number] DOB: 07/25/1951   Cancelled Treatment:    Reason Eval/Treat Not Completed: Patient not medically ready. Patient with new PE and heparin initiated on 5/11 at 1638. Per protocol - therapy evaluation held for a full 24 hrs. OT will f/u as able.   Eupha Lobb L Lyndle Pang 12/17/2020, 7:50 AM

## 2020-12-17 NOTE — Progress Notes (Signed)
ANTICOAGULATION CONSULT NOTE  Pharmacy Consult for heparin  Indication: pulmonary embolus  No Known Allergies  Patient Measurements: Height: 6\' 1"  (185.4 cm) Weight: 85.2 kg (187 lb 13.3 oz) IBW/kg (Calculated) : 79.9 Heparin Dosing Weight: 85KG  Vital Signs: Temp: 99.1 F (37.3 C) (05/11 2356) Temp Source: Oral (05/11 2356) BP: 128/88 (05/11 2356) Pulse Rate: 100 (05/11 2356)  Labs: Recent Labs    12/16/20 1016 12/16/20 1130 12/17/20 0056  HGB 16.4  --  15.7  HCT 50.5  --  46.3  PLT 180  --  158  HEPARINUNFRC  --   --  0.37  CREATININE 1.10  --  0.99  TROPONINIHS 11 13  --     Estimated Creatinine Clearance: 77.3 mL/min (by C-G formula based on SCr of 0.99 mg/dL).  Assessment: 76 yom with PMH significant for SBO, HLD, and PE in 2019 treated with Xarelto. Patient presents today with complaint of SOB. Patient is not currently on [facility]. Most recent Hgb 16.4 and Plt 180. Patient unable to recall most recent weight.   Heparin level therapeutic (0.37) on gtt at 1400 units/hr. No bleeding noted.  Goal of Therapy:  Heparin level 0.3-0.7 units/ml Monitor platelets by anticoagulation protocol: Yes   Plan:  Continue heparin at 1400 units/hr  Will f/u 6h confirmatory heparin level  [facility], PharmD, BCPS Please see amion for complete clinical pharmacist phone list 12/17/2020 1:31 AM

## 2020-12-17 NOTE — Discharge Instructions (Signed)
Information on my medicine - ELIQUIS (apixaban)   Why was Eliquis prescribed for you? Eliquis was prescribed to treat blood clots that may have been found in the veins of your legs (deep vein thrombosis) or in your lungs (pulmonary embolism) and to reduce the risk of them occurring again.  What do You need to know about Eliquis ? The starting dose is 10 mg (two 5 mg tablets) taken TWICE daily for the FIRST SEVEN (7) DAYS, then on 5/19  the dose is reduced to ONE 5 mg tablet taken TWICE daily.  Eliquis may be taken with or without food.   Try to take the dose about the same time in the morning and in the evening. If you have difficulty swallowing the tablet whole please discuss with your pharmacist how to take the medication safely.  Take Eliquis exactly as prescribed and DO NOT stop taking Eliquis without talking to the doctor who prescribed the medication.  Stopping may increase your risk of developing a new blood clot.  Refill your prescription before you run out.  After discharge, you should have regular check-up appointments with your healthcare provider that is prescribing your Eliquis.    What do you do if you miss a dose? If a dose of ELIQUIS is not taken at the scheduled time, take it as soon as possible on the same day and twice-daily administration should be resumed. The dose should not be doubled to make up for a missed dose.  Important Safety Information A possible side effect of Eliquis is bleeding. You should call your healthcare provider right away if you experience any of the following: ? Bleeding from an injury or your nose that does not stop. ? Unusual colored urine (red or dark brown) or unusual colored stools (red or black). ? Unusual bruising for unknown reasons. ? A serious fall or if you hit your head (even if there is no bleeding).  Some medicines may interact with Eliquis and might increase your risk of bleeding or clotting while on Eliquis. To help avoid  this, consult your healthcare provider or pharmacist prior to using any new prescription or non-prescription medications, including herbals, vitamins, non-steroidal anti-inflammatory drugs (NSAIDs) and supplements.  This website has more information on Eliquis (apixaban): http://www.eliquis.com/eliquis/home

## 2020-12-17 NOTE — Progress Notes (Addendum)
ANTICOAGULATION CONSULT NOTE  Pharmacy Consult for heparin =>> Apixaban Indication: pulmonary embolus  No Known Allergies  Patient Measurements: Height: 6\' 1"  (185.4 cm) Weight: 85.2 kg (187 lb 13.3 oz) IBW/kg (Calculated) : 79.9 Heparin Dosing Weight: 85KG  Vital Signs: Temp: 100.5 F (38.1 C) (05/12 1228) Temp Source: Oral (05/12 1228) BP: 131/89 (05/12 1228) Pulse Rate: 95 (05/12 1228)  Labs: Recent Labs    12/16/20 1016 12/16/20 1130 12/17/20 0056  HGB 16.4  --  15.7  HCT 50.5  --  46.3  PLT 180  --  158  HEPARINUNFRC  --   --  0.37  CREATININE 1.10  --  0.99  TROPONINIHS 11 13  --     Estimated Creatinine Clearance: 77.3 mL/min (by C-G formula based on SCr of 0.99 mg/dL).  Assessment: 42 yom with PMH significant for SBO, HLD, and PE in 2019 treated with Xarelto. Patient presents today with complaint of SOB. Patient is not currently on [facility]. Most recent Hgb 16.4 and Plt 180. Patient unable to recall most recent weight.   Patient is a difficult to stick, unable to get f/u heparin level. Spoke to [facility]. [facility] and we decided to go ahead and start Eliquis.   Plan:  Apixaban 10 mg po bid x 7 days, then on 5/19 start 5 mg po bid. D/c heparin drip after the first dose of apixaban   6/19, PharmD, [facility] Clinical Pharmacist Please see AMION for all Pharmacists' Contact Phone Numbers 12/17/2020, 1:11 PM

## 2020-12-17 NOTE — Plan of Care (Signed)
  Problem: Education: Goal: Knowledge of General Education information will improve Description Including pain rating scale, medication(s)/side effects and non-pharmacologic comfort measures Outcome: Progressing   

## 2020-12-17 NOTE — Progress Notes (Addendum)
Central TELE called RN at this time to notified of a some runs of V-tach at 1800 with HR in the 170s but did not notified previous Charity fundraiser. Patient is currently in SR, resting quietly in bed, denies in any distress. RN informed CCMD tech to be mindful and notified correct RN at the correct time so that we can contact MD appropriately.

## 2020-12-17 NOTE — Progress Notes (Signed)
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  He felt lightheaded, short of breath, had difficulties concentrating 5/11 came to the hospital.  He was diagnosed with recurrent PE, placed on heparin infusion and admitted to the hospital.  Of note, back in 2019 only took 2 weeks of Xarelto and once prescription ran out he never followed up.  Subjective / 24h Interval events: Feels a little bit better but still with some shortness of breath.  Overnight RN reports pink-red urine but patient denies hematuria or bright red blood in his urine or stool  Assessment & Plan: Principal Problem Recurrent PE-patient started on heparin drip, seems to be tolerating it well.  There are some reports of hematuria but urine looks clear this morning.  Risk/benefits regarding oral anticoagulation discussed with the patient at bedside, and we went over Xarelto versus Eliquis.  Per patient preference, start Eliquis -Given persistent symptoms will observe overnight, likely to go home tomorrow if he remains on room air  Active Problems History of CVA-residual left hemiparesis, PT unable to work with him today per their protocol  BPH-continue Flomax.  UA has some bacteria, received ceftriaxone in the ED.  No UTI type symptoms  Hyperlipidemia-continue Crestor  Chronic diastolic CHF-appears compensated   Scheduled Meds: . docusate sodium  100 mg Oral BID  . rosuvastatin  10 mg Oral Daily  . sodium chloride flush  3 mL Intravenous Q12H  . tamsulosin  0.4 mg Oral QHS   Continuous Infusions: . heparin 1,400 Units/hr (12/16/20 1637)  . lactated ringers 75 mL/hr at 12/16/20 2001   PRN Meds:.acetaminophen **OR**  acetaminophen, bisacodyl, hydrALAZINE, HYDROcodone-acetaminophen, morphine injection, ondansetron **OR** ondansetron (ZOFRAN) IV, polyethylene glycol  Diet Orders (From admission, onward)    Start     Ordered   12/16/20 1630  Diet regular Room service appropriate? Yes; Fluid consistency: Thin  Diet effective now       Question Answer Comment  Room service appropriate? Yes   Fluid consistency: Thin      12/16/20 1630          DVT prophylaxis:      Code Status: Full Code  Family Communication: no family at bedside   Status is: Observation  The patient will require care spanning > 2 midnights and should be moved to inpatient because: Inpatient level of care appropriate due to severity of illness  Dispo: The patient is from: Home              Anticipated d/c is to: Home              Patient currently is not medically stable to d/c.   Difficult to place patient No   Level of care: Telemetry Medical  Consultants:  None   Procedures:  None   Microbiology  None   Antimicrobials: None     Objective: Vitals:   12/16/20 1822 12/16/20 2009 12/16/20 2356 12/17/20 0418  BP: (!) 133/98 118/81 128/88 (!) 131/98  Pulse: 95 97 100 98  Resp: (!) 24 18 20 18   Temp: 99.4 F (37.4 C) 98.4 F (36.9 C) 99.1 F (37.3 C) 99.1 F (37.3 C)  TempSrc: Oral Oral Oral Oral  SpO2: 96% 97%  98% 98%  Weight:  85.2 kg    Height:  6\' 1"  (1.854 m)      Intake/Output Summary (Last 24 hours) at 12/17/2020 0844 Last data filed at 12/17/2020 0600 Gross per 24 hour  Intake 1156.51 ml  Output 550 ml  Net 606.51 ml   Filed Weights   12/16/20 2009  Weight: 85.2 kg    Examination:  Constitutional: NAD Eyes: no scleral icterus ENMT: Mucous membranes are moist.  Neck: normal, supple Respiratory: clear to auscultation bilaterally, no wheezing, no crackles. Normal respiratory effort. Cardiovascular: Regular rate and rhythm, no murmurs / rubs / gallops. No LE edema.  Abdomen: non  distended, no tenderness. Bowel sounds positive.  Musculoskeletal: no clubbing / cyanosis.  Skin: no rashes Neurologic: CN 2-12 grossly intact. Strength 5/5 in all 4.    Data Reviewed: I have independently reviewed following labs and imaging studies   CBC: Recent Labs  Lab 12/16/20 1016 12/17/20 0056  WBC 10.7* 12.7*  HGB 16.4 15.7  HCT 50.5 46.3  MCV 91.5 89.4  PLT 180 158   Basic Metabolic Panel: Recent Labs  Lab 12/16/20 1016 12/17/20 0056  NA 139 136  K 3.6 4.0  CL 107 105  CO2 22 22  GLUCOSE 175* 113*  BUN 25* 20  CREATININE 1.10 0.99  CALCIUM 9.5 8.8*   Liver Function Tests: No results for input(s): AST, ALT, ALKPHOS, BILITOT, PROT, ALBUMIN in the last 168 hours. Coagulation Profile: No results for input(s): INR, PROTIME in the last 168 hours. HbA1C: No results for input(s): HGBA1C in the last 72 hours. CBG: No results for input(s): GLUCAP in the last 168 hours.  Recent Results (from the past 240 hour(s))  SARS CORONAVIRUS 2 (TAT 6-24 HRS) Nasopharyngeal Nasopharyngeal Swab     Status: None   Collection Time: 12/16/20  4:46 PM   Specimen: Nasopharyngeal Swab  Result Value Ref Range Status   SARS Coronavirus 2 NEGATIVE NEGATIVE Final    Comment: (NOTE) SARS-CoV-2 target nucleic acids are NOT DETECTED.  The SARS-CoV-2 RNA is generally detectable in upper and lower respiratory specimens during the acute phase of infection. Negative results do not preclude SARS-CoV-2 infection, do not rule out co-infections with other pathogens, and should not be used as the sole basis for treatment or other patient management decisions. Negative results must be combined with clinical observations, patient history, and epidemiological information. The expected result is Negative.  Fact Sheet for Patients: 02/15/21  Fact Sheet for Healthcare Providers: HairSlick.no  This test is not yet approved or cleared  by the [place] FDA and  has been authorized for detection and/or diagnosis of SARS-CoV-2 by FDA under an Emergency Use Authorization (EUA). This EUA will remain  in effect (meaning this test can be used) for the duration of the COVID-19 declaration under Se ction 564(b)(1) of the Act, 21 U.S.C. section 360bbb-3(b)(1), unless the authorization is terminated or revoked sooner.  Performed at [facility], [address], [place], [place]      Radiology Studies: DG Chest 2 View  Result Date: 12/16/2020 CLINICAL DATA:  Chest pain EXAM: CHEST - 2 VIEW COMPARISON:  January 08, 2018 FINDINGS: Lungs are clear. Heart size and pulmonary vascularity are normal. No adenopathy. No pneumothorax. No bone lesions. IMPRESSION: Lungs clear.  Cardiac silhouette within normal limits. Electronically Signed   By: January 10, 2018 III M.D.   On: 12/16/2020 10:03   CT Angio Chest PE W and/or Wo Contrast  Result Date:  12/16/2020 CLINICAL DATA:  Worsening shortness of breath. EXAM: CT ANGIOGRAPHY CHEST WITH CONTRAST TECHNIQUE: Multidetector CT imaging of the chest was performed using the standard protocol during bolus administration of intravenous contrast. Multiplanar CT image reconstructions and MIPs were obtained to evaluate the vascular anatomy. CONTRAST:  OMNIPAQUE IOHEXOL 350 MG/ML SOLN COMPARISON:  Plain film of earlier today.  CT chest 05/07/2018. FINDINGS: Cardiovascular: Chronic right lower lobe lobar to segmental pulmonary embolism, including on 230/7. New, likely acute/subacute filling defects within right upper lobe lobar and segmental pulmonary artery branches, including on 120 through 142 of series 7. This originates in the distal most main right pulmonary artery including on 164/7. Normal heart size, without pericardial effusion. No evidence of right heart strain. Aortic atherosclerosis.  Tortuous thoracic aorta. Mediastinum/Nodes: Right-sided thyroid enlargement and heterogeneity  is similar to 01/09/2018, favoring multinodular goiter. This was biopsied on 11/15/2016. No mediastinal or hilar adenopathy.  Tiny hiatal hernia. Lungs/Pleura: No pleural fluid. Left greater than right base dependent atelectasis. Upper Abdomen: Normal imaged portions of the liver, spleen, pancreas, gallbladder, adrenal glands. Exophytic upper pole left renal 10.10 cm cyst is incompletely imaged but was present back in 2019. Musculoskeletal: No acute osseous abnormality. Review of the MIP images confirms the above findings. IMPRESSION: 1. Acute on chronic right-sided pulmonary embolism involving the right upper lobe, as detailed above. No evidence of right heart strain. 2.  Aortic Atherosclerosis (ICD10-I70.0). 3. Incompletely imaged left renal cyst. 4.  Tiny hiatal hernia. This was called to Arthor Captain at 2:34 p.m. Electronically Signed   By: Jeronimo Greaves M.D.   On: 12/16/2020 14:35    Pamella Pert, MD, PhD [facility]  Between 7 am - 7 pm I am available, please contact me via Amion (for emergencies) or Securechat (non urgent messages)  Between 7 pm - 7 am I am not available, please contact night coverage MD/APP via Amion

## 2020-12-17 NOTE — Progress Notes (Signed)
Transitions of Care Pharmacist Note  Jason Myers is a 70 y.o. male that has been diagnosed with PE and will be prescribed Eliquis (apixaban) at discharge.   Patient Education: I provided the following education on Eliquis to the patient: How to take the medication Described what the medication is Signs of bleeding Signs/symptoms of VTE and stroke  Answered their questions  Discharge Medications Plan: The patient wants to have their discharge medications filled by the Transitions of Care pharmacy rather than their usual pharmacy.  The discharge orders pharmacy have already been changed to the Transitions of Care pharmacy, the patient will receive a phone call regarding co-pay, and their medications will be delivered by the Transitions of Care pharmacy.   Thank you,   Laverna Peace, PharmD PGY-1 Pharmacy Resident 12/17/2020 5:28 PM Please see AMION for all pharmacy numbers

## 2020-12-17 NOTE — Progress Notes (Addendum)
Patient with contracted left arm and left foot, he is able to turn and urinal independently, but appears unaware of urine leaking out of urinal onto clothing and beddings. RN offered assistance with urinal multiple times during rounds and applied condom catheter but pt declined offers.Patient soiled bed pad appears with gross pinkish-reddish color mixed with urine. RN explained the importance of skin care associates with lying in urine even for a short brief time and for better monitoring his urine since he is on heparin gtts.  Patient finally agreed to have condom cath in place. Pericare and bath provided, clean linen/gowns replaced. Patient voice feeling better at this time. RN notified oncall MD and pharmacist of this new finding.   POC: Frequent purposeful rounds.  Continue to check and provide frequent peri care as needed to prevent skin breakdown.  Continue to monitor for risk of bleeding.

## 2020-12-18 ENCOUNTER — Other Ambulatory Visit (HOSPITAL_COMMUNITY): Payer: Self-pay

## 2020-12-18 DIAGNOSIS — N400 Enlarged prostate without lower urinary tract symptoms: Secondary | ICD-10-CM | POA: Diagnosis not present

## 2020-12-18 DIAGNOSIS — I2699 Other pulmonary embolism without acute cor pulmonale: Secondary | ICD-10-CM | POA: Diagnosis not present

## 2020-12-18 LAB — CBC
HCT: 48.8 % (ref 39.0–52.0)
Hemoglobin: 15.9 g/dL (ref 13.0–17.0)
MCH: 30.1 pg (ref 26.0–34.0)
MCHC: 32.6 g/dL (ref 30.0–36.0)
MCV: 92.2 fL (ref 80.0–100.0)
Platelets: 150 10*3/uL (ref 150–400)
RBC: 5.29 MIL/uL (ref 4.22–5.81)
RDW: 13.4 % (ref 11.5–15.5)
WBC: 8.3 10*3/uL (ref 4.0–10.5)
nRBC: 0 % (ref 0.0–0.2)

## 2020-12-18 MED ORDER — ELIQUIS 5 MG PO TABS
ORAL_TABLET | ORAL | 0 refills | Status: DC
  Filled 2020-12-18: qty 74, 30d supply, fill #0

## 2020-12-18 NOTE — Discharge Summary (Signed)
Physician Discharge Summary  Jason Myers TKZ:[id number] DOB: 21-Feb-1951 DOA: 12/16/2020  PCP: Gean Birchwood, [facility]  Admit date: 12/16/2020 Discharge date: 12/18/2020  Admitted From: home Disposition:  home  Recommendations for Outpatient Follow-up:  1. Follow up with PCP in 1-2 weeks  Home Health: none Equipment/Devices: none  Discharge Condition: stable CODE STATUS: Full code Diet recommendation: regular  HPI: Per admitting MD, Jason Myers is a 70 y.o. 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  He got up this AM and wasn't sure what was wrong.  He was woozy, mildly SOB, had difficulty concentrating while trying to read.  He was concerned about possible recurrent PE - "and I was right."  Prior PE was in 2018 and 2019.  He was not told that he would need lifetime blood thinners, stopped in 2019.  He was discharged on 2 weeks of Xarelto, "thought the condition was stabilized, silly me", and never followed up.  He is "extremely sedentary" - more than usual now because he is fascinated with politics ([place] ,[employer] vs. [employer]).  Hospital Course / Discharge diagnoses: Principal Problem Recurrent PE-patient was admitted to the hospital with recurrent pulmonary embolism.  He was initially started on heparin drip, seems to be tolerating it well, stable hemoglobin, and was transitioned to Eliquis again with good tolerance.  He remained on room air, and will be discharged home in stable condition.  Active Problems History of CVA-residual left hemiparesis BPH-continue Flomax.  UA has some bacteria, received ceftriaxone in the ED.  No UTI type symptoms Hyperlipidemia-continue Crestor Chronic diastolic CHF-appears compensated  Sepsis ruled out   Discharge Instructions   Allergies as of 12/18/2020   No Known Allergies     Medication List    TAKE these medications   Eliquis 5 MG  Tabs tablet Generic drug: apixaban Take 2 tablets (10mg ) twice daily for 7 days, then 1 tablet (5mg ) twice daily   multivitamin-iron-minerals-folic acid chewable tablet Chew 1 tablet by mouth daily.   naproxen sodium 220 MG tablet Commonly known as: ALEVE Take 440 mg by mouth 2 (two) times daily as needed (back pain).   PREVAGEN PO Take 1 tablet by mouth daily.   rosuvastatin 10 MG tablet Commonly known as: CRESTOR Take 10 mg by mouth daily.   tamsulosin 0.4 MG Caps capsule Commonly known as: FLOMAX Take 0.4 mg by mouth at bedtime.       Follow-up Information    Pa, [facility]. Schedule an appointment as soon as possible for a visit in 2 week(s).   Specialty: Internal Medicine Contact information: [address] [place] [phone]              Consultations:  none  Procedures/Studies:  DG Chest 2 View  Result Date: 12/16/2020 CLINICAL DATA:  Chest pain EXAM: CHEST - 2 VIEW COMPARISON:  January 08, 2018 FINDINGS: Lungs are clear. Heart size and pulmonary vascularity are normal. No adenopathy. No pneumothorax. No bone lesions. IMPRESSION: Lungs clear.  Cardiac silhouette within normal limits. Electronically Signed   By: 02/15/2021 III M.D.   On: 12/16/2020 10:03   CT Angio Chest PE W and/or Wo Contrast  Result Date: 12/16/2020 CLINICAL DATA:  Worsening shortness of breath. EXAM: CT ANGIOGRAPHY CHEST WITH CONTRAST TECHNIQUE: Multidetector CT imaging of the chest was performed using the standard protocol during bolus administration of intravenous contrast. Multiplanar  CT image reconstructions and MIPs were obtained to evaluate the vascular anatomy. CONTRAST:  100mL OMNIPAQUE IOHEXOL 350 MG/ML SOLN COMPARISON:  Plain film of earlier today.  CT chest 05/07/2018. FINDINGS: Cardiovascular: Chronic right lower lobe lobar to segmental pulmonary embolism, including on 230/7. New, likely acute/subacute filling defects within right upper lobe lobar and  segmental pulmonary artery branches, including on 120 through 142 of series 7. This originates in the distal most main right pulmonary artery including on 164/7. Normal heart size, without pericardial effusion. No evidence of right heart strain. Aortic atherosclerosis.  Tortuous thoracic aorta. Mediastinum/Nodes: Right-sided thyroid enlargement and heterogeneity is similar to 01/09/2018, favoring multinodular goiter. This was biopsied on 11/15/2016. No mediastinal or hilar adenopathy.  Tiny hiatal hernia. Lungs/Pleura: No pleural fluid. Left greater than right base dependent atelectasis. Upper Abdomen: Normal imaged portions of the liver, spleen, pancreas, gallbladder, adrenal glands. Exophytic upper pole left renal 10.10 cm cyst is incompletely imaged but was present back in 2019. Musculoskeletal: No acute osseous abnormality. Review of the MIP images confirms the above findings. IMPRESSION: 1. Acute on chronic right-sided pulmonary embolism involving the right upper lobe, as detailed above. No evidence of right heart strain. 2.  Aortic Atherosclerosis (ICD10-I70.0). 3. Incompletely imaged left renal cyst. 4.  Tiny hiatal hernia. This was called to Arthor CaptainAbigail Harris at 2:34 p.m. Electronically Signed   By: Jeronimo GreavesKyle  Talbot M.D.   On: 12/16/2020 14:35      Subjective: - no chest pain, shortness of breath, no abdominal pain, nausea or vomiting.   Discharge Exam: BP (!) 111/91 (BP Location: Left Arm)   Pulse 77   Temp 98.5 F (36.9 C) (Oral)   Resp 18   Ht 6\' 1"  (1.854 m)   Wt 85.2 kg   SpO2 94%   BMI 24.78 kg/m   General: Pt is alert, awake, not in acute distress Cardiovascular: RRR, S1/S2 +, no rubs, no gallops Respiratory: CTA bilaterally, no wheezing, no rhonchi Abdominal: Soft, NT, ND, bowel sounds + Extremities: no edema, no cyanosis    The results of significant diagnostics from this hospitalization (including imaging, microbiology, ancillary and laboratory) are listed below for reference.      Microbiology: Recent Results (from the past 240 hour(s))  SARS CORONAVIRUS 2 (TAT 6-24 HRS) Nasopharyngeal Nasopharyngeal Swab     Status: None   Collection Time: 12/16/20  4:46 PM   Specimen: Nasopharyngeal Swab  Result Value Ref Range Status   SARS Coronavirus 2 NEGATIVE NEGATIVE Final    Comment: (NOTE) SARS-CoV-2 target nucleic acids are NOT DETECTED.  The SARS-CoV-2 RNA is generally detectable in upper and lower respiratory specimens during the acute phase of infection. Negative results do not preclude SARS-CoV-2 infection, do not rule out co-infections with other pathogens, and should not be used as the sole basis for treatment or other patient management decisions. Negative results must be combined with clinical observations, patient history, and epidemiological information. The expected result is Negative.  Fact Sheet for Patients: HairSlick.nohttps://www.fda.gov/media/138098/download  Fact Sheet for Healthcare Providers: quierodirigir.comhttps://www.fda.gov/media/138095/download  This test is not yet approved or cleared by the [place] FDA and  has been authorized for detection and/or diagnosis of SARS-CoV-2 by FDA under an Emergency Use Authorization (EUA). This EUA will remain  in effect (meaning this test can be used) for the duration of the COVID-19 declaration under Se ction 564(b)(1) of the Act, 21 U.S.C. section 360bbb-3(b)(1), unless the authorization is terminated or revoked sooner.  Performed at [facility], [address], [place], [place] [address]      Labs: Basic Metabolic Panel: Recent Labs  Lab 12/16/20 1016 12/17/20 0056  NA 139 136  K 3.6 4.0  CL 107 105  CO2 22 22  GLUCOSE 175* 113*  BUN 25* 20  CREATININE 1.10 0.99  CALCIUM 9.5 8.8*   Liver Function Tests: No results for input(s): AST, ALT, ALKPHOS, BILITOT, PROT, ALBUMIN in the last 168 hours. CBC: Recent Labs  Lab 12/16/20 1016 12/17/20 0056 12/18/20 0222  WBC 10.7* 12.7* 8.3  HGB 16.4  15.7 15.9  HCT 50.5 46.3 48.8  MCV 91.5 89.4 92.2  PLT 180 158 150   CBG: No results for input(s): GLUCAP in the last 168 hours. Hgb A1c No results for input(s): HGBA1C in the last 72 hours. Lipid Profile No results for input(s): CHOL, HDL, LDLCALC, TRIG, CHOLHDL, LDLDIRECT in the last 72 hours. Thyroid function studies No results for input(s): TSH, T4TOTAL, T3FREE, THYROIDAB in the last 72 hours.  Invalid input(s): FREET3 Urinalysis    Component Value Date/Time   COLORURINE AMBER (A) 12/16/2020 1522   APPEARANCEUR HAZY (A) 12/16/2020 1522   LABSPEC 1.033 (H) 12/16/2020 1522   PHURINE 6.0 12/16/2020 1522   GLUCOSEU NEGATIVE 12/16/2020 1522   HGBUR SMALL (A) 12/16/2020 1522   BILIRUBINUR NEGATIVE 12/16/2020 1522   KETONESUR NEGATIVE 12/16/2020 1522   PROTEINUR 30 (A) 12/16/2020 1522   NITRITE NEGATIVE 12/16/2020 1522   LEUKOCYTESUR SMALL (A) 12/16/2020 1522    FURTHER DISCHARGE INSTRUCTIONS:   Get Medicines reviewed and adjusted: Please take all your medications with you for your next visit with your Primary MD   Laboratory/radiological data: Please request your Primary MD to go over all hospital tests and procedure/radiological results at the follow up, please ask your Primary MD to get all Hospital records sent to his/her office.   In some cases, they will be blood work, cultures and biopsy results pending at the time of your discharge. Please request that your primary care M.D. goes through all the records of your hospital data and follows up on these results.   Also Note the following: If you experience worsening of your admission symptoms, develop shortness of breath, life threatening emergency, suicidal or homicidal thoughts you must seek medical attention immediately by calling 911 or calling your MD immediately  if symptoms less severe.   You must read complete instructions/literature along with all the possible adverse reactions/side effects for all the Medicines  you take and that have been prescribed to you. Take any new Medicines after you have completely understood and accpet all the possible adverse reactions/side effects.    Do not drive when taking Pain medications or sleeping medications (Benzodaizepines)   Do not take more than prescribed Pain, Sleep and Anxiety Medications. It is not advisable to combine anxiety,sleep and pain medications without talking with your primary care practitioner   Special Instructions: If you have smoked or chewed Tobacco  in the last 2 yrs please stop smoking, stop any regular Alcohol  and or any Recreational drug use.   Wear Seat belts while driving.   Please note: You were cared for by a hospitalist during your hospital stay. Once you are discharged, your primary care physician will handle any further medical issues. Please note that NO REFILLS for any discharge medications will be authorized once you are discharged, as it is imperative that you return to your primary care physician (or establish a relationship with a primary care physician if you do  not have one) for your post hospital discharge needs so that they can reassess your need for medications and monitor your lab values.  Time coordinating discharge: 40 minutes  SIGNED:  Pamella Pert, MD, PhD 12/18/2020, 8:50 AM

## 2020-12-18 NOTE — Evaluation (Signed)
Occupational Therapy Evaluation Patient Details Name: Jason Myers MRN: [id number] DOB: 1951/05/03 Today's Date: 12/18/2020    History of Present Illness 70 yo male presenting with SOB. Admitted for observation 12/16/20 for treatment of PE placed on heparin drip. PMH including SBO, HLD, CVA, PE, BPH, and CHF.   Clinical Impression   PTA, pt was living alone and was independent with aide who assists with IADLs; use of cane for mobility. Pt currently requiring Min A for LB ADLs and functional mobility due to decreased safety awareness and balance. Pt with one episode of LOB requiring Mod A for correction and safety. Pt with tendency to perform ADLs and functional mobiltiy as he "normally does it." Cues throughout for safety. Pt would benefit from further acute OT to facilitate safe dc. Recommend dc to home once medically stable per physician.      Follow Up Recommendations  No OT follow up    Equipment Recommendations  None recommended by OT    Recommendations for Other Services PT consult     Precautions / Restrictions Precautions Precautions: Fall Precaution Comments: hx of fall      Mobility Bed Mobility Overal bed mobility: Modified Independent                  Transfers Overall transfer level: Needs assistance Equipment used: Hemi-walker Transfers: Sit to/from Stand Sit to Stand: Supervision         General transfer comment: supervision for saftey    Balance Overall balance assessment: Needs assistance Sitting-balance support: Feet supported;Feet unsupported;No upper extremity supported;Bilateral upper extremity supported;Single extremity supported Sitting balance-Leahy Scale: Good     Standing balance support: Single extremity supported;No upper extremity supported;During functional activity Standing balance-Leahy Scale: Fair                             ADL either performed or assessed with clinical judgement   ADL Overall ADL's : Needs  assistance/impaired Eating/Feeding: Supervision/ safety;Set up;Sitting   Grooming: Set up;Supervision/safety;Sitting   Upper Body Bathing: Supervision/ safety;Set up;Sitting   Lower Body Bathing: Minimal assistance;Sit to/from stand   Upper Body Dressing : Supervision/safety;Set up;Sitting   Lower Body Dressing: Minimal assistance;Sit to/from stand   Toilet Transfer: Minimal assistance;Ambulation (simulated in room)           Functional mobility during ADLs: Minimal assistance;Min guard (hemi walker)       Vision         Perception     Praxis      Pertinent Vitals/Pain Pain Assessment: No/denies pain     Hand Dominance Right   Extremity/Trunk Assessment Upper Extremity Assessment Upper Extremity Assessment: LUE deficits/detail LUE Deficits / Details: Prior CVA. LUE in flexed position against chest LUE Coordination: decreased gross motor;decreased fine motor   Lower Extremity Assessment Lower Extremity Assessment: Defer to PT evaluation RLE Deficits / Details: generalized weakness LLE Deficits / Details: hx L sided hemiparesis, hip and knee strength 3+/5, ankle dorsiflexion 2/5, plantarflexion 2/5 LLE Sensation: decreased light touch;decreased proprioception LLE Coordination: decreased fine motor;decreased gross motor   Cervical / Trunk Assessment Cervical / Trunk Assessment: Normal   Communication Communication Communication: Other (comment) (very tangential)   Cognition Arousal/Alertness: Awake/alert Behavior During Therapy: Impulsive;Restless Overall Cognitive Status: History of cognitive impairments - at baseline  General Comments  VSS on RA    Exercises     Shoulder Instructions      Home Living Family/patient expects to be discharged to:: Private residence Living Arrangements: Alone   Type of Home: Apartment Home Access: Elevator     Home Layout: One level     Bathroom Shower/Tub:  Chief Strategy Officer: Standard     Home Equipment: Grab bars - tub/shower;Cane - single point          Prior Functioning/Environment Level of Independence: Needs assistance  Gait / Transfers Assistance Needed: limited community ambulator with cane, drives to store ADL's / Homemaking Assistance Needed: independent with bathing and dressing, HHA 5 day per week for 2-3/hours assists with cleaning, SNAP meals provided            OT Problem List: Decreased strength;Decreased activity tolerance;Impaired balance (sitting and/or standing);Decreased knowledge of use of DME or AE;Decreased knowledge of precautions;Decreased safety awareness;Impaired UE functional use      OT Treatment/Interventions: Self-care/ADL training;Therapeutic exercise;Energy conservation;DME and/or AE instruction;Therapeutic activities;Patient/family education    OT Goals(Current goals can be found in the care plan section) Acute Rehab OT Goals Patient Stated Goal: go home OT Goal Formulation: With patient Time For Goal Achievement: 01/01/21 Potential to Achieve Goals: Good  OT Frequency: Min 2X/week   Barriers to D/C:            Co-evaluation              AM-PAC OT "6 Clicks" Daily Activity     Outcome Measure Help from another person eating meals?: A Little Help from another person taking care of personal grooming?: A Little Help from another person toileting, which includes using toliet, bedpan, or urinal?: A Little Help from another person bathing (including washing, rinsing, drying)?: A Little Help from another person to put on and taking off regular upper body clothing?: A Little Help from another person to put on and taking off regular lower body clothing?: A Little 6 Click Score: 18   End of Session Equipment Utilized During Treatment: Gait belt Nurse Communication: Mobility status  Activity Tolerance: Patient tolerated treatment well Patient left: with call bell/phone  within reach;in bed;with bed alarm set  OT Visit Diagnosis: Unsteadiness on feet (R26.81);Other abnormalities of gait and mobility (R26.89);Muscle weakness (generalized) (M62.81)                Time: 4132-4401 OT Time Calculation (min): 15 min Charges:  OT General Charges $OT Visit: 1 Visit OT Evaluation $OT Eval Low Complexity: 1 Low  Tracker Mance MSOT, OTR/L Acute Rehab Pager: [phone] Office: [phone]  Theodoro Grist Macaela Presas 12/18/2020, 1:25 PM

## 2020-12-18 NOTE — TOC Initial Note (Addendum)
Transition of Care [facility]) - Initial/Assessment Note    Patient Details  Name: Jason Myers MRN: [id number] Date of Birth: 03/08/1951  [facility] CM/SW Contact:    Kingsley Plan, RN Phone Number: 12/18/2020, 10:42 AM  Clinical Narrative:                 Patient from home. Confirmed face sheet information. Best contact number is [phone] is DR Fleet Contras.   Patient has an aide service every morning from 0900 to 1130 am.   Patient has had [facility] in the past and would like them again . NCM messaged Amy with [facility] and awaiting call back.  Amy unable to accept referral. Kandee Keen with [facility]  accepted  17 Son at bedside. He has already brought his mother a walker and requesting walker from [employer] be cancelled. Velna Hatchet with [employer] aware.   Expected Discharge Plan: Home w Home Health Services Barriers to Discharge: No Barriers Identified   Patient Goals and CMS Choice Patient states their goals for this hospitalization and ongoing recovery are:: to return to home CMS Medicare.gov Compare Post Acute Care list provided to:: Patient Choice offered to / list presented to : Patient  Expected Discharge Plan and Services Expected Discharge Plan: Home w Home Health Services   Discharge Planning Services: CM Consult Post Acute Care Choice: Home Health Living arrangements for the past 2 months: Apartment Expected Discharge Date: 12/18/20               DME Arranged: N/A DME Agency: NA       HH Arranged: PT HH Agency: [facility] Date HH Agency Contacted: 12/18/20 Time HH Agency Contacted: 1042 Representative spoke with at [facility] Agency: Amy awaiting call back  Prior Living Arrangements/Services Living arrangements for the past 2 months: Apartment Lives with:: Self Patient language and need for interpreter reviewed:: Yes Do you feel safe going back to the place where you live?: Yes      Need for Family Participation in Patient Care: Yes  (Comment) Care giver support system in place?: Yes (comment) Current home services: DME Criminal Activity/Legal Involvement Pertinent to Current Situation/Hospitalization: No - Comment as needed  Activities of Daily Living Home Assistive Devices/Equipment: Cane (specify quad or straight) ADL Screening (condition at time of admission) Patient's cognitive ability adequate to safely complete daily activities?: Yes Is the patient deaf or have difficulty hearing?: No Does the patient have difficulty seeing, even when wearing glasses/contacts?: No Does the patient have difficulty concentrating, remembering, or making decisions?: No Patient able to express need for assistance with ADLs?: Yes Does the patient have difficulty dressing or bathing?: Yes Independently performs ADLs?: No Does the patient have difficulty walking or climbing stairs?: Yes Weakness of Legs: Left Weakness of Arms/Hands: Left  Permission Sought/Granted   Permission granted to share information with : No              Emotional Assessment Appearance:: Appears stated age Attitude/Demeanor/Rapport: Engaged Affect (typically observed): Accepting Orientation: : Oriented to Self,Oriented to Place,Oriented to  Time,Oriented to Situation Alcohol / Substance Use: Not Applicable Psych Involvement: No (comment)  Admission diagnosis:  Acute cystitis without hematuria [N30.00] Other acute pulmonary embolism without acute cor pulmonale (HCC) [I26.99] Acute pulmonary embolism without acute cor pulmonale (HCC) [I26.99] Patient Active Problem List   Diagnosis Date Noted  . Acute pulmonary embolism without acute cor pulmonale (HCC) 12/16/2020  . Pulmonary embolism (HCC)   . Hypercholesterolemia   .  Abdominal pain 05/07/2018  . Acute lower UTI 05/07/2018  . SBO (small bowel obstruction) (HCC) 05/07/2018  . Chronic diastolic CHF (congestive heart failure) (HCC) 01/16/2018  . Acute on chronic diastolic CHF (congestive heart  failure) (HCC) 01/16/2018  . Hypoxia 01/09/2018  . AKI (acute kidney injury) (HCC) 01/09/2018  . Hypotension 01/09/2018  . PE (pulmonary thromboembolism) (HCC) 11/13/2016  . BPH (benign prostatic hyperplasia) 11/13/2016  . Thyroid nodule 11/13/2016  . HLD (hyperlipidemia) 06/08/2007  . HEMORRHOIDS 06/08/2007  . CVA (cerebral vascular accident) [facility]) 1984   PCP:  Pa, [facility] Pharmacy:   [facility]- [place], [place] - [address] [place] [address] Phone: [phone] Fax: [phone]  [id number] [place], [place] - [address] AT [facility] OF [address] [place] [address] Phone: [phone] Fax: [phone]  [facility] [address] [place] [address] Phone: [phone] Fax: [phone]     Social Determinants of Health (SDOH) Interventions    Readmission Risk Interventions No flowsheet data found.

## 2020-12-18 NOTE — Progress Notes (Signed)
Patient has ordered for discharge. Patient is going home. Given discharge instructions and paper to the patient.  Iv removed.Given all belongings to the patient.

## 2020-12-18 NOTE — Progress Notes (Signed)
#   2.  S/W CHARLES @ [facility] # [phone]   APIXABAN ; NON-FORMULARY  ELIQUIS 10 MG BID : NON-FORMULARY   1. ELIQUIS 5 MG BID  COVER- YES  CO-PAY- ZERO DOLLARS  TIER- 3 DRUG  PRIOR APPROVAL- NO   2. ELIQUIS 2.5 MG BID  COVER- YES  CO-PAY- ZERO DOLLARS  TIER- 3 DRUG  PRIOR APPROVAL- NO   DEDUCTIBLE : UNMET  OUT-OF-POCKET : UNMET  LOWE INCOME SUBSIDY LEVEL -2   PREFERRED PHARMACY : YES  [facility]   SECONDARY INS : MEDICAID [place] ACCESS, EFF-DATE 01-06-2018, CO-PAY- $4.00 FOR EACH PRESCRIPTION

## 2020-12-18 NOTE — Evaluation (Addendum)
Physical Therapy Evaluation Patient Details Name: Jason Myers MRN: [id number] DOB: [birth year] Today's Date: 12/18/2020   History of Present Illness  70 yo presenting with SOB. Admitted for observation 12/16/20 for treatment of PE placed on heparin drip. PMH including SBO, HLD, CVA, PE, BPH, and CHF.  Clinical Impression  PTA pt living alone in apartment with elevator. Pt ambulates with SPC community distances and drives, He has HHAide 5x/wk who assists with cleaning, and meals are provided by [employer]. Pt is limited by L hemiparesis at baseline, and has poor safety awareness, and knowledge of deficits. Pt is mod I for bed mobility, supervision for transfers and ambulated with hemiwalker with minA-min guard for safety, once pt increased his walking velocity beyond abilities pt experienced 1x LoB requiring modA for regaining balance. Pt educated on safe velocity for his current level of weakness. PT recommending HHPT at discharge to work on strength and balance. PT will continue to follow acutely.     Follow Up Recommendations Supervision - Intermittent;Home health PT    Equipment Recommendations  None recommended by PT (has necessary equipment)       Precautions / Restrictions Precautions Precautions: Fall (Simultaneous filing. User may not have seen previous data.) Precaution Comments: hx of fall Restrictions Weight Bearing Restrictions: No      Mobility  Bed Mobility Overal bed mobility: Modified Independent                  Transfers Overall transfer level: Needs assistance Equipment used: Hemi-walker Transfers: Sit to/from Stand Sit to Stand: Supervision         General transfer comment: supervision for saftey  Ambulation/Gait Ambulation/Gait assistance: Min guard;Mod assist;Min assist Gait Distance (Feet): 50 Feet Assistive device: Hemi-walker Gait Pattern/deviations: Step-through pattern;Decreased dorsiflexion - left;Decreased weight shift to left;Decreased step  length - right;Decreased stance time - left Gait velocity: variable and too fast for conditions Gait velocity interpretation: <1.8 ft/sec, indicate of risk for recurrent falls General Gait Details: min A progressing to min guard for safety pt with decreased L ankle mobility and stability which pt compensates for with increase knee flexion, each step has increased eversion and with increased velocity pt experiences 1x LoB requiring mod A for steadying         Balance Overall balance assessment: Needs assistance Sitting-balance support: Feet supported;Feet unsupported;No upper extremity supported;Bilateral upper extremity supported;Single extremity supported Sitting balance-Leahy Scale: Good     Standing balance support: Single extremity supported;No upper extremity supported;During functional activity Standing balance-Leahy Scale: Fair                               Pertinent Vitals/Pain Pain Assessment: No/denies pain    Home Living Family/patient expects to be discharged to:: Private residence     Type of Home: Apartment         Home Equipment: Grab bars - tub/shower      Prior Function Level of Independence: Needs assistance   Gait / Transfers Assistance Needed: limited community ambulator with cane, drives to store  ADL's / Homemaking Assistance Needed: independent with bathing and dressing, HHA 5 day per week for 2-3/hours assists with cleaning, SNAP meals provided        Hand Dominance   Dominant Hand: Right    Extremity/Trunk Assessment   Upper Extremity Assessment Upper Extremity Assessment: Defer to OT evaluation    Lower Extremity Assessment Lower Extremity Assessment: RLE deficits/detail;LLE deficits/detail RLE Deficits /  Details: generalized weakness LLE Deficits / Details: hx L sided hemiparesis, hip and knee strength 3+/5, ankle dorsiflexion 2/5, plantarflexion 2/5 LLE Sensation: decreased light touch;decreased proprioception LLE  Coordination: decreased fine motor;decreased gross motor    Cervical / Trunk Assessment Cervical / Trunk Assessment: Normal  Communication   Communication: Other (comment) (very tangential)  Cognition Arousal/Alertness: Awake/alert Behavior During Therapy: Impulsive;Restless Overall Cognitive Status: History of cognitive impairments - at baseline                                        General Comments General comments (skin integrity, edema, etc.): VSS on RA        Assessment/Plan    PT Assessment Patient needs continued PT services  PT Problem List Decreased strength;Decreased range of motion;Decreased activity tolerance;Decreased balance;Decreased mobility;Decreased coordination;Decreased cognition;Decreased safety awareness;Impaired sensation;Pain;Decreased skin integrity       PT Treatment Interventions DME instruction;Gait training;Functional mobility training;Therapeutic activities;Therapeutic exercise;Balance training;Cognitive remediation;Patient/family education;Neuromuscular re-education    PT Goals (Current goals can be found in the Care Plan section)  Acute Rehab PT Goals Patient Stated Goal: go home PT Goal Formulation: With patient Time For Goal Achievement: 01/01/21 Potential to Achieve Goals: Good    Frequency Min 3X/week    AM-PAC PT "6 Clicks" Mobility  Outcome Measure Help needed turning from your back to your side while in a flat bed without using bedrails?: None Help needed moving from lying on your back to sitting on the side of a flat bed without using bedrails?: None Help needed moving to and from a bed to a chair (including a wheelchair)?: None Help needed standing up from a chair using your arms (e.g., wheelchair or bedside chair)?: None Help needed to walk in hospital room?: A Little Help needed climbing 3-5 steps with a railing? : A Lot 6 Click Score: 21    End of Session Equipment Utilized During Treatment: Gait  belt Activity Tolerance: Patient tolerated treatment well Patient left: Other (comment) (with OT) Nurse Communication: Mobility status PT Visit Diagnosis: Unsteadiness on feet (R26.81);Other abnormalities of gait and mobility (R26.89);Repeated falls (R29.6);Muscle weakness (generalized) (M62.81);History of falling (Z91.81);Difficulty in walking, not elsewhere classified (R26.2);Hemiplegia and hemiparesis Hemiplegia - Right/Left: Left Hemiplegia - dominant/non-dominant: Non-dominant Hemiplegia - caused by: Unspecified    Time: 8938-1017 PT Time Calculation (min) (ACUTE ONLY): 25 min   Charges:   PT Evaluation $PT Eval Moderate Complexity: 1 Mod PT Treatments $Gait Training: 8-22 mins        Jason Myers B. Beverely Risen PT, DPT Acute Rehabilitation Services Pager [phone] Office [phone]   Elon Alas Fleet 12/18/2020, 10:25 AM

## 2020-12-21 ENCOUNTER — Telehealth (HOSPITAL_BASED_OUTPATIENT_CLINIC_OR_DEPARTMENT_OTHER): Payer: Self-pay

## 2020-12-21 ENCOUNTER — Other Ambulatory Visit (HOSPITAL_COMMUNITY): Payer: Self-pay

## 2020-12-22 NOTE — Telephone Encounter (Signed)
Pharmacy Transitions of Care Follow-up Telephone Call  Date of discharge: 12/18/20 Discharge Diagnosis: Acute Pulmonary Embolism   How have you been since you were released from the hospital? Patient reports feeling good, but almost feels like he is pulled in different directions. He reports not taking his Eliquis currently because he has a dental appointment on Thursday.   Medication changes made at discharge:  - START: Eliquis   Medication changes verified by the patient?  Yes     Medication Accessibility:  Home Pharmacy: [facility] on [address]   Was the patient provided with refills on discharged medications? No   Have all prescriptions been transferred from [facility] to home pharmacy? Yes   Is the patient able to afford medications? Yes    Medication Review:  APIXABAN (ELIQUIS)  - Discussed importance of taking medication around the same time everyday  - Reviewed potential DDIs with patient  - Advised patient of medications to avoid (NSAIDs, ASA)  - Educated that Tylenol (acetaminophen) will be the preferred analgesic to prevent risk of bleeding  - Emphasized importance of monitoring for signs and symptoms of bleeding (abnormal bruising, prolonged bleeding, nose bleeds, bleeding from gums, discolored urine, black tarry stools)  - Advised patient to alert all providers of anticoagulation therapy prior to starting a new medication or having a procedure    Follow-up Appointments:  If their condition worsens, is the pt aware to call PCP or go to the Emergency Dept.? Yes   Final Patient Assessment: Patient described the situation with his Eliquis in that he will be starting this medication after his dental appointment on Thursday. He was hesitant to start the medication before his appointment. He was consulted to use Tylenol instead of naproxen once he begins Eliquis. He was educated on adverse effects to watch out for.

## 2021-04-30 ENCOUNTER — Other Ambulatory Visit: Payer: Self-pay | Admitting: Internal Medicine

## 2021-05-01 LAB — LIPID PANEL
Cholesterol: 182 mg/dL (ref <200)
HDL: 41 mg/dL (ref >=40)
LDL Cholesterol (Calc): 106 mg/dL (calc) — ABNORMAL HIGH
Non-HDL Cholesterol (Calc): 141 mg/dL (calc) — ABNORMAL HIGH (ref <130)
Total CHOL/HDL Ratio: 4.4 (calc) (ref <5.0)
Triglycerides: 230 mg/dL — ABNORMAL HIGH (ref <150)

## 2021-05-01 LAB — COMPLETE METABOLIC PANEL WITH GFR
AG Ratio: 1.6 (calc) (ref 1.0–2.5)
ALT: 19 U/L (ref 9–46)
AST: 20 U/L (ref 10–35)
Albumin: 4.3 g/dL (ref 3.6–5.1)
Alkaline phosphatase (APISO): 66 U/L (ref 35–144)
BUN/Creatinine Ratio: 26 (calc) — ABNORMAL HIGH (ref 6–22)
BUN: 28 mg/dL — ABNORMAL HIGH (ref 7–25)
CO2: 22 mmol/L (ref 20–32)
Calcium: 9.7 mg/dL (ref 8.6–10.3)
Chloride: 106 mmol/L (ref 98–110)
Creat: 1.06 mg/dL (ref 0.70–1.28)
Globulin: 2.7 g/dL (calc) (ref 1.9–3.7)
Glucose, Bld: 84 mg/dL (ref 65–99)
Potassium: 4.8 mmol/L (ref 3.5–5.3)
Sodium: 139 mmol/L (ref 135–146)
Total Bilirubin: 0.6 mg/dL (ref 0.2–1.2)
Total Protein: 7 g/dL (ref 6.1–8.1)
eGFR: 75 mL/min/{1.73_m2} (ref >=60)

## 2021-05-01 LAB — CBC
HCT: 50.4 % — ABNORMAL HIGH (ref 38.5–50.0)
Hemoglobin: 16.8 g/dL (ref 13.2–17.1)
MCH: 30.2 pg (ref 27.0–33.0)
MCHC: 33.3 g/dL (ref 32.0–36.0)
MCV: 90.6 fL (ref 80.0–100.0)
MPV: 11.5 fL (ref 7.5–12.5)
Platelets: 239 10*3/uL (ref 140–400)
RBC: 5.56 10*6/uL (ref 4.20–5.80)
RDW: 13.1 % (ref 11.0–15.0)
WBC: 7.2 10*3/uL (ref 3.8–10.8)

## 2021-05-01 LAB — TSH: TSH: 1.88 mIU/L (ref 0.40–4.50)

## 2022-04-11 IMAGING — DX DG CHEST 2V
2 series · 2 of 2 positions shown · non-contrast
Comparison: January 08, 2018

CLINICAL DATA: Chest pain

EXAM:
CHEST - 2 VIEW

[chest lat]
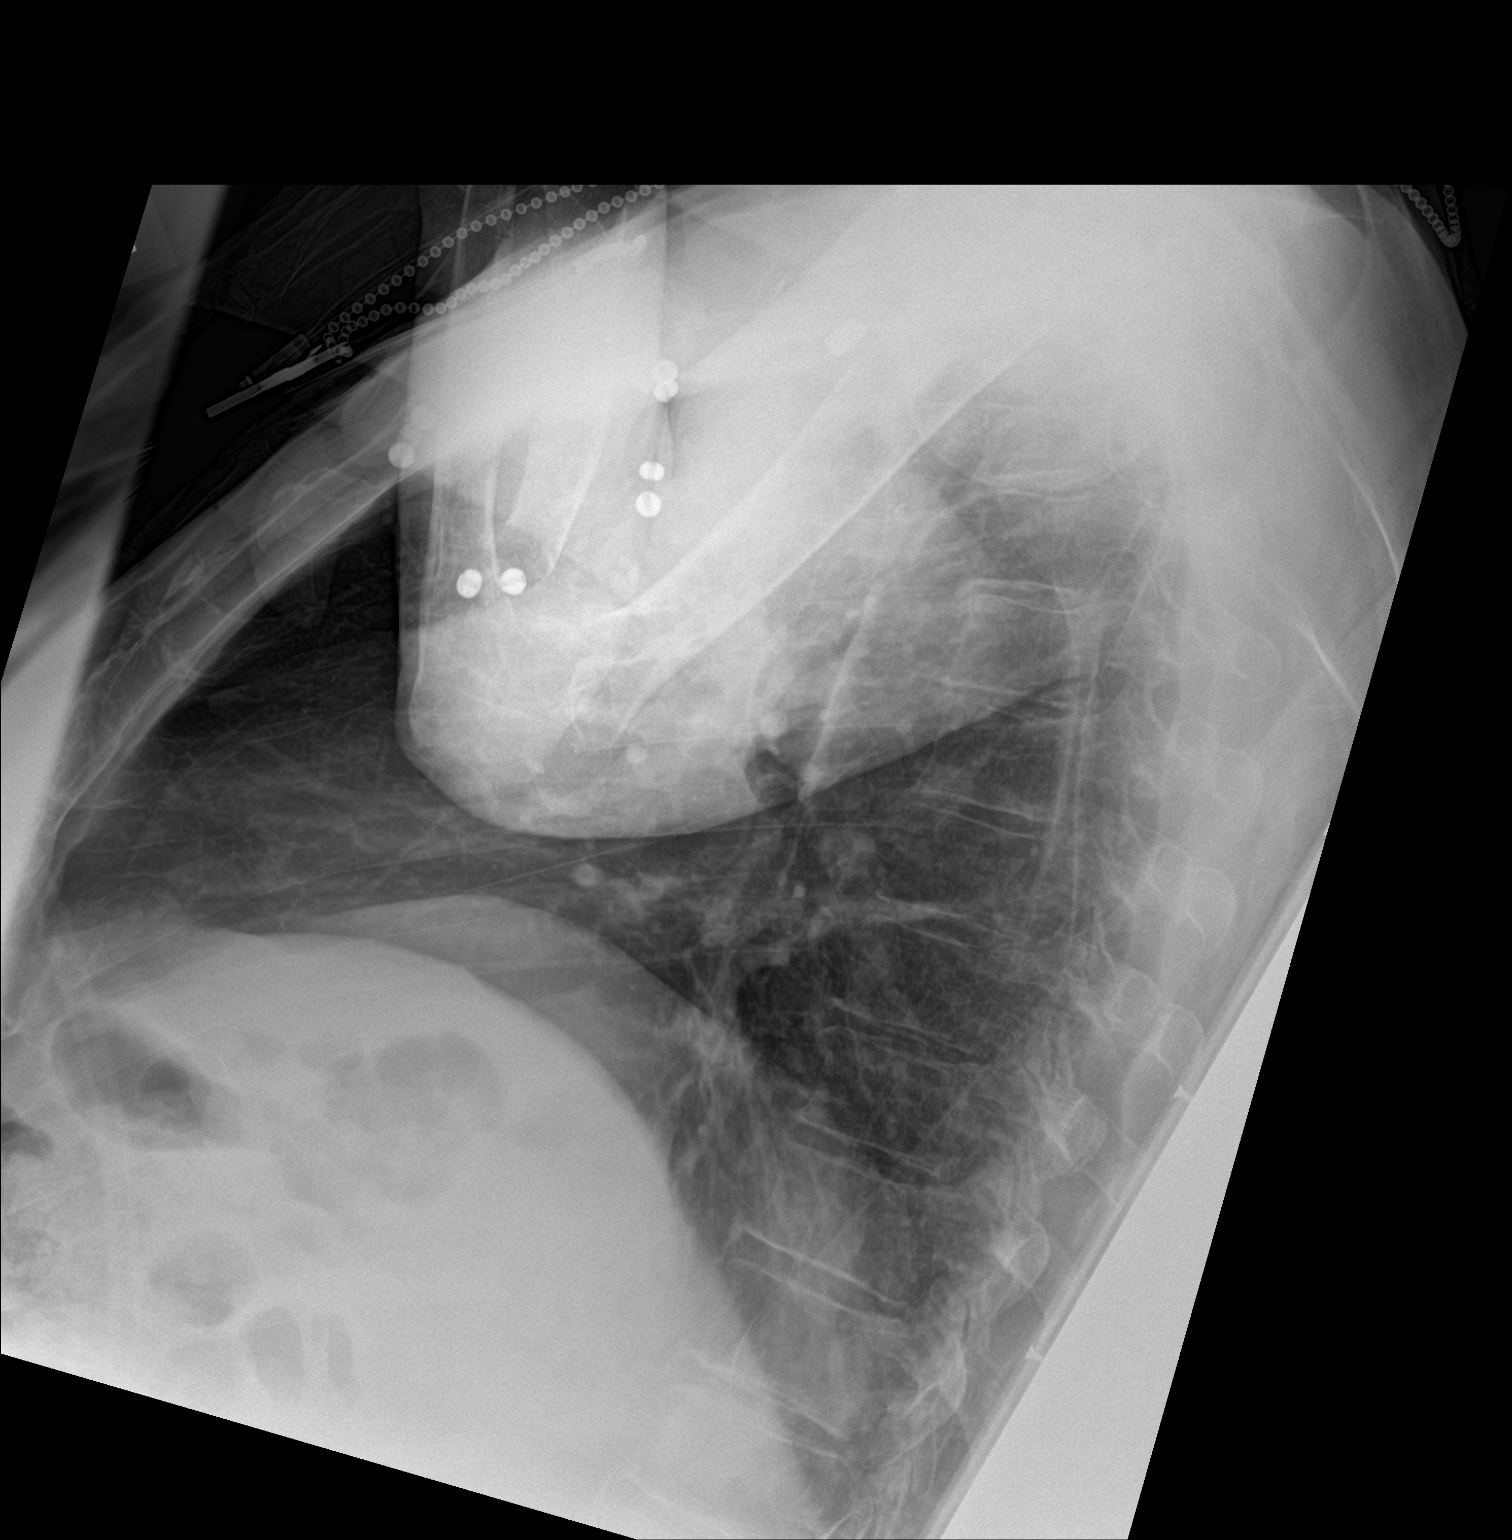

[chest ap]
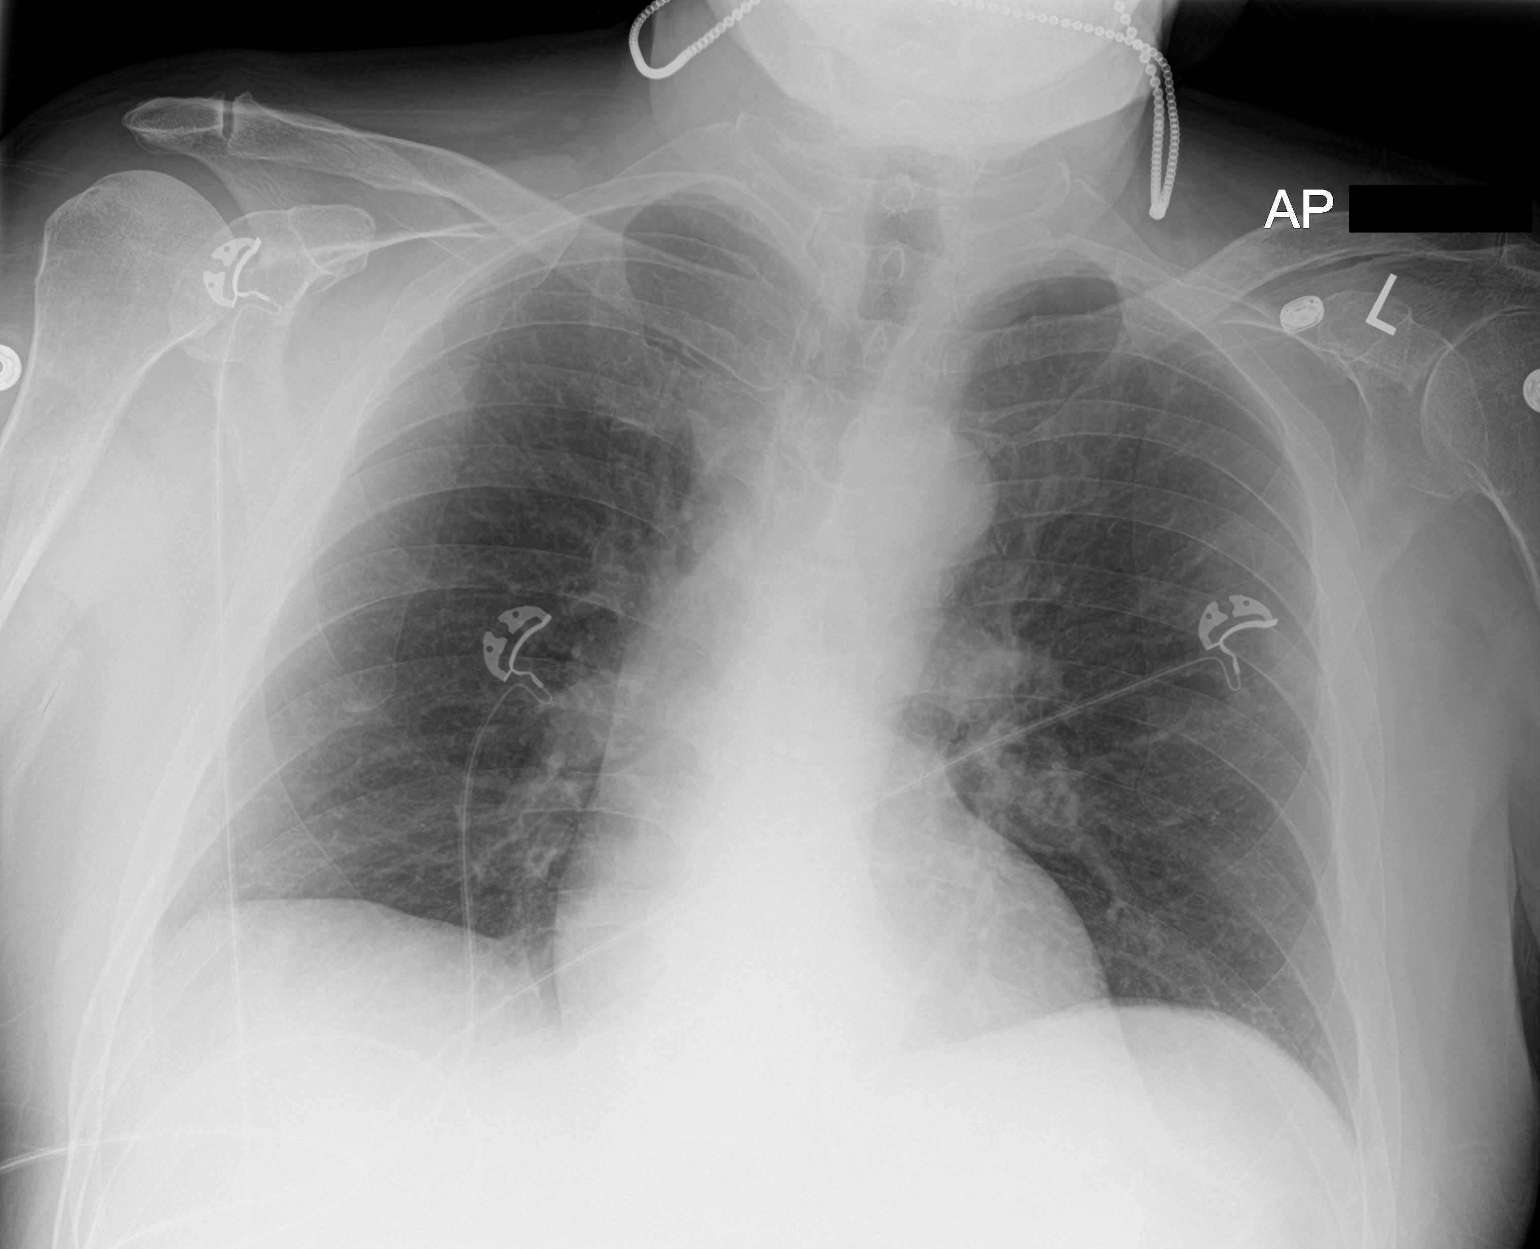

[2 of 2 positions shown; findings below may reference images not displayed]

FINDINGS: Lungs are clear. Heart size and pulmonary vascularity are normal. No
adenopathy. No pneumothorax. No bone lesions.
IMPRESSION: Lungs clear.  Cardiac silhouette within normal limits.

## 2024-03-02 ENCOUNTER — Emergency Department (HOSPITAL_COMMUNITY)

## 2024-03-02 ENCOUNTER — Emergency Department (HOSPITAL_COMMUNITY)
Admission: EM | Admit: 2024-03-02 | Discharge: 2024-03-02 | Disposition: A | Discharge status: Home / Self Care | Attending: Emergency Medicine | Admitting: Emergency Medicine

## 2024-03-02 ENCOUNTER — Other Ambulatory Visit: Payer: Self-pay

## 2024-03-02 ENCOUNTER — Encounter (HOSPITAL_COMMUNITY): Payer: Self-pay

## 2024-03-02 DIAGNOSIS — R0789 Other chest pain: Secondary | ICD-10-CM | POA: Diagnosis not present

## 2024-03-02 DIAGNOSIS — W19XXXA Unspecified fall, initial encounter: Secondary | ICD-10-CM | POA: Diagnosis not present

## 2024-03-02 DIAGNOSIS — R0781 Pleurodynia: Secondary | ICD-10-CM | POA: Diagnosis present

## 2024-03-02 DIAGNOSIS — Z8673 Personal history of transient ischemic attack (TIA), and cerebral infarction without residual deficits: Secondary | ICD-10-CM | POA: Insufficient documentation

## 2024-03-02 DIAGNOSIS — E041 Nontoxic single thyroid nodule: Secondary | ICD-10-CM | POA: Insufficient documentation

## 2024-03-02 DIAGNOSIS — I5032 Chronic diastolic (congestive) heart failure: Secondary | ICD-10-CM | POA: Insufficient documentation

## 2024-03-02 DIAGNOSIS — R270 Ataxia, unspecified: Secondary | ICD-10-CM | POA: Diagnosis not present

## 2024-03-02 DIAGNOSIS — S2242XA Multiple fractures of ribs, left side, initial encounter for closed fracture: Secondary | ICD-10-CM | POA: Insufficient documentation

## 2024-03-02 DIAGNOSIS — Z7901 Long term (current) use of anticoagulants: Secondary | ICD-10-CM | POA: Insufficient documentation

## 2024-03-02 DIAGNOSIS — I700 Atherosclerosis of aorta: Secondary | ICD-10-CM | POA: Diagnosis not present

## 2024-03-02 DIAGNOSIS — N400 Enlarged prostate without lower urinary tract symptoms: Secondary | ICD-10-CM | POA: Diagnosis not present

## 2024-03-02 DIAGNOSIS — G319 Degenerative disease of nervous system, unspecified: Secondary | ICD-10-CM | POA: Diagnosis not present

## 2024-03-02 DIAGNOSIS — R9082 White matter disease, unspecified: Secondary | ICD-10-CM | POA: Diagnosis not present

## 2024-03-02 DIAGNOSIS — J9811 Atelectasis: Secondary | ICD-10-CM | POA: Diagnosis not present

## 2024-03-02 DIAGNOSIS — Y92512 Supermarket, store or market as the place of occurrence of the external cause: Secondary | ICD-10-CM | POA: Insufficient documentation

## 2024-03-02 DIAGNOSIS — Z87891 Personal history of nicotine dependence: Secondary | ICD-10-CM | POA: Insufficient documentation

## 2024-03-02 LAB — TROPONIN I (HIGH SENSITIVITY)
Troponin I (High Sensitivity): 12 ng/L (ref <18)
Troponin I (High Sensitivity): 11 ng/L (ref <18)

## 2024-03-02 LAB — CBC
HCT: 46.5 % (ref 39.0–52.0)
Hemoglobin: 15.6 g/dL (ref 13.0–17.0)
MCH: 30.8 pg (ref 26.0–34.0)
MCHC: 33.5 g/dL (ref 30.0–36.0)
MCV: 91.9 fL (ref 80.0–100.0)
Platelets: 208 K/uL (ref 150–400)
RBC: 5.06 MIL/uL (ref 4.22–5.81)
RDW: 13.7 % (ref 11.5–15.5)
WBC: 7.9 K/uL (ref 4.0–10.5)
nRBC: 0 % (ref 0.0–0.2)

## 2024-03-02 LAB — BASIC METABOLIC PANEL WITH GFR
Anion gap: 7 (ref 5–15)
BUN: 34 mg/dL — ABNORMAL HIGH (ref 8–23)
CO2: 21 mmol/L — ABNORMAL LOW (ref 22–32)
Calcium: 9 mg/dL (ref 8.9–10.3)
Chloride: 113 mmol/L — ABNORMAL HIGH (ref 98–111)
Creatinine, Ser: 1.02 mg/dL (ref 0.61–1.24)
GFR, Estimated: >60 mL/min (ref >60)
Glucose, Bld: 102 mg/dL — ABNORMAL HIGH (ref 70–99)
Potassium: 3.5 mmol/L (ref 3.5–5.1)
Sodium: 141 mmol/L (ref 135–145)

## 2024-03-02 MED ORDER — IOHEXOL 350 MG/ML SOLN
75.0000 mL | Freq: Once | INTRAVENOUS | Status: AC | PRN
  Administered 2024-03-02: 75 mL via INTRAVENOUS

## 2024-03-02 MED ORDER — LACTATED RINGERS IV BOLUS
1000.0000 mL | Freq: Once | INTRAVENOUS | Status: AC
  Administered 2024-03-02: 1000 mL via INTRAVENOUS

## 2024-03-02 MED ORDER — OXYCODONE HCL 5 MG PO TABS: 5.0000 mg | ORAL_TABLET | ORAL | 0 refills | Status: DC | PRN

## 2024-03-02 MED ORDER — GABAPENTIN 100 MG PO CAPS: 100.0000 mg | ORAL_CAPSULE | Freq: Three times a day (TID) | ORAL | 0 refills | Status: DC

## 2024-03-02 NOTE — Discharge Instructions (Addendum)
 While you were in the emergency room, you had CT scans done of your head, neck, chest abdomen pelvis.  Your only injuries are rib fractures, 2 through 6, on the left side.  These fractures will heal on their own.  It is important to keep your pain under control.  You may continue taking all other medications as prescribed, but please take 1000 mg of Tylenol  every 8 hours.  He can also take 100 mg of gabapentin  every 8 hours for the next 1 week.  I have sent you as needed oxycodone  for severe pain.  You may take 5 mg every 6 hours.  Please follow-up with your primary care doctor.  Return to the emergency room if you develop difficulty with your breathing, fever, or have another fall.

## 2024-03-02 NOTE — ED Triage Notes (Signed)
 Pt coming in from home . Pt reports that yesterday he was in line at the grocery store when he fell. Pt reports that he landed hard on his cane on his left side. Pt reports I have pain in my ribs.

## 2024-03-02 NOTE — ED Provider Notes (Signed)
 [place] EMERGENCY DEPARTMENT AT [facility] Provider Note  CSN: [id number] Arrival date & time: 03/02/24 9349  Chief Complaint(s) Fall  HPI Jason Myers is a 73 y.o. male who is here today after he had a fall yesterday with a grocery store.  Patient lost his balance and the line, landed on his left side.  He says he landed on his cane, and where he landed, he has had pain.   Past Medical History Past Medical History:  Diagnosis Date   BPH (benign prostatic hyperplasia)    Chronic diastolic CHF (congestive heart failure) (HCC) 01/16/2018   CVA (cerebral vascular accident) (HCC) 1984   left side affected.     Hypercholesterolemia    Pulmonary embolism (HCC)    2018, 2019, 2022   SBO (small bowel obstruction) (HCC) 04/2018   Patient Active Problem List   Diagnosis Date Noted   Acute pulmonary embolism without acute cor pulmonale (HCC) 12/16/2020   Pulmonary embolism (HCC)    Hypercholesterolemia    Abdominal pain 05/07/2018   Acute lower UTI 05/07/2018   SBO (small bowel obstruction) (HCC) 05/07/2018   Chronic diastolic CHF (congestive heart failure) (HCC) 01/16/2018   Acute on chronic diastolic CHF (congestive heart failure) (HCC) 01/16/2018   Hypoxia 01/09/2018   AKI (acute kidney injury) (HCC) 01/09/2018   Hypotension 01/09/2018   PE (pulmonary thromboembolism) (HCC) 11/13/2016   BPH (benign prostatic hyperplasia) 11/13/2016   Thyroid  nodule 11/13/2016   HLD (hyperlipidemia) 06/08/2007   HEMORRHOIDS 06/08/2007   CVA (cerebral vascular accident) (HCC) 1984   Home Medication(s) Prior to Admission medications   Medication Sig Start Date End Date Taking? Authorizing Provider  gabapentin  (NEURONTIN ) 100 MG capsule Take 1 capsule (100 mg total) by mouth 3 (three) times daily for 7 days. 03/02/24 03/09/24 Yes Mannie Pac T, DO  oxyCODONE  (ROXICODONE ) 5 MG immediate release tablet Take 1 tablet (5 mg total) by mouth every 4 (four) hours as needed for severe pain  (pain score 7-10). 03/02/24  Yes Mannie Pac T, DO  apixaban  (ELIQUIS ) 5 MG TABS tablet Take 2 tablets (10mg ) twice daily for 7 days, then 1 tablet (5mg ) twice daily 12/18/20   Gherghe, Costin M, MD  Apoaequorin (PREVAGEN PO) Take 1 tablet by mouth daily.    [provider]  multivitamin-iron-minerals-folic acid (CENTRUM) chewable tablet Chew 1 tablet by mouth daily.    [provider]  naproxen sodium (ALEVE) 220 MG tablet Take 440 mg by mouth 2 (two) times daily as needed (back pain).    [provider]  rosuvastatin  (CRESTOR ) 10 MG tablet Take 10 mg by mouth daily. 09/28/16   [provider]  tamsulosin  (FLOMAX ) 0.4 MG CAPS capsule Take 0.4 mg by mouth at bedtime.  09/28/16   [provider]  Past Surgical History Past Surgical History:  Procedure Laterality Date   LIPOMA EXCISION Left    groin   Family History Family History  Problem Relation Age of Onset   Alcoholism Mother    Kidney Stones Father     Social History Social History   Tobacco Use   Smoking status: Former   Smokeless tobacco: Never   Tobacco comments:    light smoking  Vaping Use   Vaping status: Never Used  Substance Use Topics   Alcohol use: Yes    Comment: occasionally   Drug use: Yes    Types: Marijuana    Comment: daily   Allergies Patient has no known allergies.  Review of Systems Review of Systems  Physical Exam Vital Signs  I have reviewed the triage vital signs BP 114/61   Pulse 80   Temp 97.9 F (36.6 C) (Oral)   Resp 15   Ht 6' 1 (1.854 m)   Wt 81.6 kg   SpO2 100%   BMI 23.75 kg/m   Physical Exam Vitals and nursing note reviewed.  HENT:     Head: Normocephalic and atraumatic.  Eyes:     Pupils: Pupils are equal, round, and reactive to light.  Cardiovascular:     Rate and Rhythm: Normal rate.      Pulses: Normal pulses.  Pulmonary:     Effort: Pulmonary effort is normal.  Abdominal:     General: Abdomen is flat. There is no distension.     Palpations: Abdomen is soft.     Tenderness: There is no abdominal tenderness. There is no guarding.  Musculoskeletal:     Cervical back: Normal range of motion. No rigidity.     Comments: No tenderness to palpation in the bilateral shoulders, upper arms, elbows, forearms or wrists.  No tenderness to palpation in the chest.  Pelvis stable, nontender.  No tenderness, deformities noted on bilateral upper legs, knees, lower legs or ankles.  Patient able to lift both legs from the bed.  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  Skin:    General: Skin is warm.  Neurological:     Mental Status: He is alert and oriented to person, place, and time. Mental status is at baseline.     Gait: Gait normal.     ED Results and Treatments Labs (all labs ordered are listed, but only abnormal results are displayed) Labs Reviewed  BASIC METABOLIC PANEL WITH GFR - Abnormal; Notable for the following components:      Result Value   Chloride 113 (*)    CO2 21 (*)    Glucose, Bld 102 (*)    BUN 34 (*)    All other components within normal limits  CBC  TROPONIN I (HIGH SENSITIVITY)  TROPONIN I (HIGH SENSITIVITY)  Radiology CT CHEST ABDOMEN PELVIS W CONTRAST Result Date: 03/02/2024 EXAM: CT CHEST, ABDOMEN AND PELVIS WITH CONTRAST 03/02/2024 09:35:18 AM TECHNIQUE: CT of the chest, abdomen and pelvis was performed with the administration of intravenous contrast. Multiplanar reformatted images are provided for review. Automated exposure control, iterative reconstruction, and/or weight based adjustment of the mA/kV was utilized to reduce the  radiation dose to as low as reasonably achievable. COMPARISON: CT angio of chest 12/16/2020. CT angio chest and CT abdomen and pelvis 05/07/2018. CLINICAL HISTORY: Polytrauma, blunt. Mechanical fall. Fell on cane. Left-sided rib pain. FINDINGS: CHEST: MEDIASTINUM: Heart and pericardium are unremarkable. The central airways are clear. A 1.9 cm right thyroid  nodule stable. Atherosclerotic changes are present at the aortic arch and great vessel origins without significant stenosis. THORACIC LYMPH NODES: No mediastinal, hilar or axillary lymphadenopathy. LUNGS AND PLEURA: No pneumothorax is present. Mild dependent atelectasis is present. Focal contusion is present subjacent to the fracture. Minimal atelectasis is present on the right. ABDOMEN AND PELVIS: LIVER: The liver is unremarkable. GALLBLADDER AND BILE DUCTS: Gallbladder is unremarkable. No biliary ductal dilatation. SPLEEN: No acute abnormality. PANCREAS: No acute abnormality. ADRENAL GLANDS: No acute abnormality. KIDNEYS, URETERS AND BLADDER: A 12.9 cm simple cyst at the upper pole of the left kidney is stable. Other simple cysts are present bilaterally. Recommend no imaging follow up for these lesions. No stone or other mass lesion is present. No obstruction is present. Urinary bladder is unremarkable. The prostate indents the undersurface of the urinary bladder without evidence for obstruction. GI AND BOWEL: The diverticular changes are present in the sigmoid colon without focal inflammation to suggest diverticulitis. There is no bowel obstruction. REPRODUCTIVE ORGANS: A prostate is enlarged measuring 6.1 cm in transverse diameter, stable. PERITONEUM AND RETROPERITONEUM: No ascites. No free air. Fat herniates into the left inguinal canal without associated bowel. VASCULATURE: Aorta is normal in caliber. ABDOMINAL AND PELVIS LYMPH NODES: No lymphadenopathy. Atherosclerotic calcifications are present in the abdominal aorta. Maximal transverse diameter is 3.8 cm.  REPRODUCTIVE ORGANS: No acute abnormality. BONES AND SOFT TISSUES: Left second through sixth rib fractures are present. Minimal displacement is present in the left fifth rib fracture. No right-sided rib fractures are present. Moderate degenerative changes are present in both hips. Dextroconvex curvature of the thoracolumbar spine is present at T12-L1. Moderate facet degenerative changes are present in the lower lumbar spine. Grade 1 degenerative anterolisthesis at L4-5 is stable. IMPRESSION: 1. Left second through sixth rib fractures with minimal displacement in the left fifth rib fracture and focal contusion subjacent to the fracture. No pneumothorax. 2. Stable 1.9 cm right thyroid  nodule. 3. Enlarged prostate measuring 6.1 cm in transverse diameter, stable, indenting the undersurface of the urinary bladder without evidence for obstruction. 4. Diverticular changes in the sigmoid colon without focal inflammation to suggest diverticulitis. 5. Fat herniation into the left inguinal canal without associated bowel. 6. Aortic atherosclerosis. Electronically signed by: Lonni Necessary MD 03/02/2024 10:10 AM EDT RP Workstation: [id number]   CT Cervical Spine Wo Contrast Result Date: 03/02/2024 EXAM: CT CERVICAL SPINE WITHOUT CONTRAST 03/02/2024 09:27:01 AM TECHNIQUE: CT of the cervical spine was performed without the administration of intravenous contrast. Multiplanar reformatted images are provided for review. Automated exposure control, iterative reconstruction, and/or weight based adjustment of the mA/kV was utilized to reduce the radiation dose to as low as reasonably achievable. COMPARISON: None available. CLINICAL HISTORY: Ataxia, cervical trauma. Pt coming in from home. Pt reports that yesterday he was in line at the grocery store when he  fell. Pt reports that he landed hard on his cane on his left side. Pt reports I have pain in my ribs. FINDINGS: CERVICAL SPINE: BONES AND ALIGNMENT: No acute fracture or  traumatic malalignment. DEGENERATIVE CHANGES: Asymmetric right-sided uncovertebral spurring is present at C4-5 and C5-6. Moderate right foraminal narrowing is present at C3-4, C5-6, and C6-7. Severe right foraminal stenosis is present at C4-5. Asymmetric right-sided facet hypertrophy is present at C7-T1 without focal stenosis. SOFT TISSUES: No prevertebral soft tissue swelling. A heterogeneous right thyroid  nodule was previously evaluated with ultrasound and biopsy. IMPRESSION: 1. No acute fracture or traumatic malalignment. 2. Asymmetric right-sided uncovertebral spurring at C4-5 and C5-6 with severe right foraminal stenosis at C4-5 and moderate right foraminal narrowing at C3-4, C5-6, and C6-7. Electronically signed by: Lonni Necessary MD 03/02/2024 09:46 AM EDT RP Workstation: [id number]   CT Head Wo Contrast Result Date: 03/02/2024 EXAM: CT HEAD WITHOUT CONTRAST 03/02/2024 09:27:01 AM TECHNIQUE: CT of the head was performed without the administration of intravenous contrast. Automated exposure control, iterative reconstruction, and/or weight based adjustment of the mA/kV was utilized to reduce the radiation dose to as low as reasonably achievable. COMPARISON: None available. CLINICAL HISTORY: Ataxia, head trauma. Pt coming in from home. Pt reports that yesterday he was in line at the grocery store when he fell. Pt reports that he landed hard on his cane on his left side. Pt reports I have pain in my ribs. FINDINGS: BRAIN AND VENTRICLES: Remote anterior right MCA (middle cerebral artery) territory infarct is present. Marked encephalomalacia and volume loss is present in the right basal ganglia. Expected dilation of the right lateral ventricle is present. Mild generalized atrophy and white matter disease is present otherwise. A remote lacunar infarct is present in the right cerebellum. ORBITS: No acute abnormality. SINUSES: Soft tissue in the frontal sinuses bilaterally appears chronic. SOFT TISSUES AND  SKULL: Moderate mucosal thickening is present in the left external auditory canal without osseous erosion. IMPRESSION: 1. No acute intracranial abnormality related to the reported head trauma. 2. Remote anterior right MCA territory infarct with marked encephalomalacia and volume loss in the right basal ganglia, and expected dilation of the right lateral ventricle. 3. Mild generalized atrophy and white matter disease. 4. Remote lacunar infarct in the right cerebellum. Electronically signed by: Lonni Necessary MD 03/02/2024 09:41 AM EDT RP Workstation: [id number]   DG Chest 2 View Result Date: 03/02/2024 CLINICAL DATA:  Chest pain EXAM: CHEST - 2 VIEW COMPARISON:  12/16/2020 FINDINGS: Minimal linear scarring or atelectasis in the left mid lung. Right lung clear. No pneumothorax. Heart size and mediastinal contours are within normal limits. No effusion. Spondylitic changes at the thoracolumbar junction. IMPRESSION: Minimal left mid lung scarring or atelectasis. Electronically Signed   By: JONETTA Faes M.D.   On: 03/02/2024 07:43    Pertinent labs & imaging results that were available during my care of the patient were reviewed by me and considered in my medical decision making (see MDM for details).  Medications Ordered in ED Medications  iohexol  (OMNIPAQUE ) 350 MG/ML injection 75 mL (75 mLs Intravenous Contrast Given 03/02/24 0930)  lactated ringers  bolus 1,000 mL (0 mLs Intravenous Stopped 03/02/24 1112)  Procedures Procedures  (including critical care time)  Medical Decision Making / ED Course   This patient presents to the ED for concern of chest wall pain after fall, this involves an extensive number of treatment options, and is a complaint that carries with it a high risk of complications and morbidity.  The differential diagnosis includes rib contusion, rib  fracture, intra-abdominal bleed, intracranial hemorrhage, C-spine fracture.  MDM: Patient's exam is overall reassuring, however with his medical comorbidities including his anticoagulated status and age, do believe requires additional imaging including CT imaging of the head, neck, chest abdomen pelvis.  Patient was able to ambulate to his bed in the emergency room.  Reassessment 11:20 AM-patient's CT imaging shows no intracranial hemorrhage, negative C-spine films, ribs 2 through 6 are fractured.  Large renal cyst, but otherwise negative.  Patient was able to pull 1250 on his incentive spirometer.  Will discharge with analgesia.   Additional history obtained:  -External records from outside source obtained and reviewed including: Chart review including previous notes, labs, imaging, consultation notes   Lab Tests: -I ordered, reviewed, and interpreted labs.   The pertinent results include:   Labs Reviewed  BASIC METABOLIC PANEL WITH GFR - Abnormal; Notable for the following components:      Result Value   Chloride 113 (*)    CO2 21 (*)    Glucose, Bld 102 (*)    BUN 34 (*)    All other components within normal limits  CBC  TROPONIN I (HIGH SENSITIVITY)  TROPONIN I (HIGH SENSITIVITY)      EKG sinus rhythm, no ST segment depressions or elevations.  No evidence of acute ischemia.  EKG Interpretation Date/Time:  Saturday March 02 2024 07:17:56 EDT Ventricular Rate:  86 PR Interval:  136 QRS Duration:  94 QT Interval:  372 QTC Calculation: 445 R Axis:   10  Text Interpretation: Normal sinus rhythm Nonspecific T wave abnormality Abnormal ECG When compared with ECG of 16-Dec-2020 08:59, PREVIOUS ECG IS PRESENT Confirmed by Mannie Pac [phone]) on 03/02/2024 11:22:00 AM         Imaging Studies ordered: I ordered imaging studies including CT head, CT cervical spine, CT chest abdomen pelvis I independently visualized and interpreted imaging. I agree with the radiologist  interpretation   Medicines ordered and prescription drug management: Meds ordered this encounter  Medications   iohexol  (OMNIPAQUE ) 350 MG/ML injection 75 mL   lactated ringers  bolus 1,000 mL   gabapentin  (NEURONTIN ) 100 MG capsule    Sig: Take 1 capsule (100 mg total) by mouth 3 (three) times daily for 7 days.    Dispense:  21 capsule    Refill:  0   oxyCODONE  (ROXICODONE ) 5 MG immediate release tablet    Sig: Take 1 tablet (5 mg total) by mouth every 4 (four) hours as needed for severe pain (pain score 7-10).    Dispense:  12 tablet    Refill:  0    -I have reviewed the patients home medicines and have made adjustments as needed   Cardiac Monitoring: The patient was maintained on a cardiac monitor.  I personally viewed and interpreted the cardiac monitored which showed an underlying rhythm of: Normal sinus rhythm  Social Determinants of Health:  Factors impacting patients care include: Multiple medical comorbidities including anticoagulated status, prior CVA.   Reevaluation: After the interventions noted above, I reevaluated the patient and found that they have :improved  Co morbidities that complicate the patient evaluation  Past Medical  History:  Diagnosis Date   BPH (benign prostatic hyperplasia)    Chronic diastolic CHF (congestive heart failure) (HCC) 01/16/2018   CVA (cerebral vascular accident) (HCC) 1984   left side affected.     Hypercholesterolemia    Pulmonary embolism (HCC)    2018, 2019, 2022   SBO (small bowel obstruction) (HCC) 04/2018      Dispostion: I considered admission for this patient, however with his imaging, and his reassuring incentive spirometry he is appropriate for discharge.     Final Clinical Impression(s) / ED Diagnoses Final diagnoses:  Closed fracture of multiple ribs of left side, initial encounter     @PCDICTATION @    Mannie Pac T, DO 03/02/24 1124

## 2024-03-02 NOTE — ED Triage Notes (Signed)
 Pt takes eliquis . Pt denies hitting head. Pt denies loc.

## 2024-03-02 NOTE — ED Notes (Signed)
Dc instructions and scripts reviewed with pt no questions or concerns at this time.  

## 2024-04-14 ENCOUNTER — Other Ambulatory Visit: Payer: Self-pay

## 2024-04-14 ENCOUNTER — Emergency Department (HOSPITAL_COMMUNITY)

## 2024-04-14 ENCOUNTER — Encounter (HOSPITAL_COMMUNITY)
Admission: EM | Disposition: A | Discharge status: Home / Self Care | Payer: Self-pay | Source: Home / Self Care | Attending: Orthopedic Surgery

## 2024-04-14 ENCOUNTER — Emergency Department (HOSPITAL_COMMUNITY): Admitting: Certified Registered Nurse Anesthetist

## 2024-04-14 ENCOUNTER — Inpatient Hospital Stay (HOSPITAL_COMMUNITY)
Admission: EM | Admit: 2024-04-14 | Discharge: 2024-04-17 | DRG: 493 | Disposition: A | Discharge status: Home / Self Care | Attending: Orthopedic Surgery | Admitting: Orthopedic Surgery

## 2024-04-14 ENCOUNTER — Encounter (HOSPITAL_COMMUNITY): Payer: Self-pay | Admitting: Orthopedic Surgery

## 2024-04-14 DIAGNOSIS — N400 Enlarged prostate without lower urinary tract symptoms: Secondary | ICD-10-CM | POA: Diagnosis present

## 2024-04-14 DIAGNOSIS — I5032 Chronic diastolic (congestive) heart failure: Secondary | ICD-10-CM | POA: Diagnosis present

## 2024-04-14 DIAGNOSIS — Z86711 Personal history of pulmonary embolism: Secondary | ICD-10-CM | POA: Diagnosis not present

## 2024-04-14 DIAGNOSIS — I69354 Hemiplegia and hemiparesis following cerebral infarction affecting left non-dominant side: Secondary | ICD-10-CM | POA: Diagnosis not present

## 2024-04-14 DIAGNOSIS — W010XXA Fall on same level from slipping, tripping and stumbling without subsequent striking against object, initial encounter: Secondary | ICD-10-CM | POA: Diagnosis present

## 2024-04-14 DIAGNOSIS — Z87891 Personal history of nicotine dependence: Secondary | ICD-10-CM

## 2024-04-14 DIAGNOSIS — D62 Acute posthemorrhagic anemia: Secondary | ICD-10-CM | POA: Diagnosis present

## 2024-04-14 DIAGNOSIS — Z7901 Long term (current) use of anticoagulants: Secondary | ICD-10-CM | POA: Diagnosis not present

## 2024-04-14 DIAGNOSIS — I5033 Acute on chronic diastolic (congestive) heart failure: Secondary | ICD-10-CM | POA: Diagnosis not present

## 2024-04-14 DIAGNOSIS — Y92002 Bathroom of unspecified non-institutional (private) residence single-family (private) house as the place of occurrence of the external cause: Secondary | ICD-10-CM

## 2024-04-14 DIAGNOSIS — M898X9 Other specified disorders of bone, unspecified site: Secondary | ICD-10-CM | POA: Diagnosis present

## 2024-04-14 DIAGNOSIS — Z811 Family history of alcohol abuse and dependence: Secondary | ICD-10-CM | POA: Diagnosis not present

## 2024-04-14 DIAGNOSIS — I639 Cerebral infarction, unspecified: Secondary | ICD-10-CM | POA: Diagnosis present

## 2024-04-14 DIAGNOSIS — S42202A Unspecified fracture of upper end of left humerus, initial encounter for closed fracture: Secondary | ICD-10-CM | POA: Diagnosis not present

## 2024-04-14 DIAGNOSIS — S42222A 2-part displaced fracture of surgical neck of left humerus, initial encounter for closed fracture: Principal | ICD-10-CM | POA: Diagnosis present

## 2024-04-14 DIAGNOSIS — Z23 Encounter for immunization: Secondary | ICD-10-CM

## 2024-04-14 DIAGNOSIS — Z79899 Other long term (current) drug therapy: Secondary | ICD-10-CM | POA: Diagnosis not present

## 2024-04-14 DIAGNOSIS — E7800 Pure hypercholesterolemia, unspecified: Secondary | ICD-10-CM | POA: Diagnosis present

## 2024-04-14 DIAGNOSIS — I679 Cerebrovascular disease, unspecified: Secondary | ICD-10-CM

## 2024-04-14 HISTORY — PX: ORIF HUMERUS FRACTURE: SHX2126

## 2024-04-14 LAB — BASIC METABOLIC PANEL WITH GFR
Anion gap: 13 (ref 5–15)
BUN: 23 mg/dL (ref 8–23)
CO2: 21 mmol/L — ABNORMAL LOW (ref 22–32)
Calcium: 9.1 mg/dL (ref 8.9–10.3)
Chloride: 105 mmol/L (ref 98–111)
Creatinine, Ser: 1.07 mg/dL (ref 0.61–1.24)
GFR, Estimated: >60 mL/min (ref >60)
Glucose, Bld: 106 mg/dL — ABNORMAL HIGH (ref 70–99)
Potassium: 3.7 mmol/L (ref 3.5–5.1)
Sodium: 139 mmol/L (ref 135–145)

## 2024-04-14 LAB — CBC WITH DIFFERENTIAL/PLATELET
Abs Immature Granulocytes: 0.02 K/uL (ref 0.00–0.07)
Basophils Absolute: 0 K/uL (ref 0.0–0.1)
Basophils Relative: 0 %
Eosinophils Absolute: 0.1 K/uL (ref 0.0–0.5)
Eosinophils Relative: 2 %
HCT: 45.2 % (ref 39.0–52.0)
Hemoglobin: 14.9 g/dL (ref 13.0–17.0)
Immature Granulocytes: 0 %
Lymphocytes Relative: 25 %
Lymphs Abs: 1.6 K/uL (ref 0.7–4.0)
MCH: 30.7 pg (ref 26.0–34.0)
MCHC: 33 g/dL (ref 30.0–36.0)
MCV: 93.2 fL (ref 80.0–100.0)
Monocytes Absolute: 0.5 K/uL (ref 0.1–1.0)
Monocytes Relative: 8 %
Neutro Abs: 4.3 K/uL (ref 1.7–7.7)
Neutrophils Relative %: 65 %
Platelets: 216 K/uL (ref 150–400)
RBC: 4.85 MIL/uL (ref 4.22–5.81)
RDW: 13.2 % (ref 11.5–15.5)
WBC: 6.6 K/uL (ref 4.0–10.5)
nRBC: 0 % (ref 0.0–0.2)

## 2024-04-14 LAB — CBG MONITORING, ED
Glucose-Capillary: 107 mg/dL — ABNORMAL HIGH (ref 70–99)
Glucose-Capillary: 98 mg/dL (ref 70–99)

## 2024-04-14 LAB — PROTIME-INR
INR: 1 (ref 0.8–1.2)
Prothrombin Time: 13.7 s (ref 11.4–15.2)

## 2024-04-14 SURGERY — OPEN REDUCTION INTERNAL FIXATION (ORIF) PROXIMAL HUMERUS FRACTURE
Anesthesia: General | Laterality: Left

## 2024-04-14 MED ORDER — STERILE WATER FOR IRRIGATION IR SOLN
Status: DC | PRN
  Administered 2024-04-14: 1000 mL

## 2024-04-14 MED ORDER — TRANEXAMIC ACID-NACL 1000-0.7 MG/100ML-% IV SOLN
1000.0000 mg | INTRAVENOUS | Status: AC
  Administered 2024-04-14: 1000 mg via INTRAVENOUS
  Filled 2024-04-14: qty 100

## 2024-04-14 MED ORDER — PHENYLEPHRINE 80 MCG/ML (10ML) SYRINGE FOR IV PUSH (FOR BLOOD PRESSURE SUPPORT)
PREFILLED_SYRINGE | INTRAVENOUS | Status: DC | PRN
  Administered 2024-04-14: 80 ug via INTRAVENOUS
  Administered 2024-04-14: 160 ug via INTRAVENOUS
  Administered 2024-04-14 (×2): 80 ug via INTRAVENOUS

## 2024-04-14 MED ORDER — PROPOFOL 10 MG/ML IV BOLUS
INTRAVENOUS | Status: DC | PRN
  Administered 2024-04-14: 120 mg via INTRAVENOUS
  Administered 2024-04-14: 30 mg via INTRAVENOUS

## 2024-04-14 MED ORDER — HYDROMORPHONE HCL 1 MG/ML IJ SOLN
INTRAMUSCULAR | Status: DC | PRN
  Administered 2024-04-14: .5 mg via INTRAVENOUS

## 2024-04-14 MED ORDER — PROPOFOL 10 MG/ML IV BOLUS
INTRAVENOUS | Status: AC
  Filled 2024-04-14: qty 20

## 2024-04-14 MED ORDER — TRANEXAMIC ACID-NACL 1000-0.7 MG/100ML-% IV SOLN
INTRAVENOUS | Status: AC
  Filled 2024-04-14: qty 100

## 2024-04-14 MED ORDER — KETOROLAC TROMETHAMINE 30 MG/ML IJ SOLN
INTRAMUSCULAR | Status: AC
  Filled 2024-04-14: qty 1

## 2024-04-14 MED ORDER — FENTANYL CITRATE (PF) 250 MCG/5ML IJ SOLN
INTRAMUSCULAR | Status: AC
  Filled 2024-04-14: qty 5

## 2024-04-14 MED ORDER — FENTANYL CITRATE (PF) 100 MCG/2ML IJ SOLN: 25.0000 ug | INTRAMUSCULAR | Status: DC | PRN

## 2024-04-14 MED ORDER — LIDOCAINE 2% (20 MG/ML) 5 ML SYRINGE
INTRAMUSCULAR | Status: DC | PRN
  Administered 2024-04-14: 40 mg via INTRAVENOUS

## 2024-04-14 MED ORDER — CHLORHEXIDINE GLUCONATE 0.12 % MT SOLN
15.0000 mL | Freq: Once | OROMUCOSAL | Status: AC
  Administered 2024-04-14: 15 mL via OROMUCOSAL

## 2024-04-14 MED ORDER — OXYCODONE HCL 5 MG/5ML PO SOLN: 5.0000 mg | Freq: Once | ORAL | Status: DC | PRN

## 2024-04-14 MED ORDER — KETOROLAC TROMETHAMINE 30 MG/ML IJ SOLN
INTRAMUSCULAR | Status: DC | PRN
  Administered 2024-04-14: 15 mg via INTRAVENOUS

## 2024-04-14 MED ORDER — HYDROMORPHONE HCL 1 MG/ML IJ SOLN
INTRAMUSCULAR | Status: AC
  Filled 2024-04-14: qty 0.5

## 2024-04-14 MED ORDER — DEXAMETHASONE SODIUM PHOSPHATE 10 MG/ML IJ SOLN
INTRAMUSCULAR | Status: AC
  Filled 2024-04-14: qty 1

## 2024-04-14 MED ORDER — ONDANSETRON HCL 4 MG/2ML IJ SOLN: 4.0000 mg | Freq: Four times a day (QID) | INTRAMUSCULAR | Status: DC | PRN

## 2024-04-14 MED ORDER — MENTHOL 3 MG MT LOZG: 1.0000 | LOZENGE | OROMUCOSAL | Status: DC | PRN

## 2024-04-14 MED ORDER — ONDANSETRON HCL 4 MG PO TABS: 4.0000 mg | ORAL_TABLET | Freq: Four times a day (QID) | ORAL | Status: DC | PRN

## 2024-04-14 MED ORDER — DEXMEDETOMIDINE HCL IN NACL 200 MCG/50ML IV SOLN
INTRAVENOUS | Status: DC | PRN
  Administered 2024-04-14: 4 ug via INTRAVENOUS
  Administered 2024-04-14: 8 ug via INTRAVENOUS

## 2024-04-14 MED ORDER — DOCUSATE SODIUM 100 MG PO CAPS
100.0000 mg | ORAL_CAPSULE | Freq: Two times a day (BID) | ORAL | Status: DC
  Administered 2024-04-15: 100 mg via ORAL
  Filled 2024-04-14 (×5): qty 1

## 2024-04-14 MED ORDER — PHENOL 1.4 % MT LIQD: 1.0000 | OROMUCOSAL | Status: DC | PRN

## 2024-04-14 MED ORDER — KETOROLAC TROMETHAMINE 15 MG/ML IJ SOLN
7.5000 mg | Freq: Four times a day (QID) | INTRAMUSCULAR | Status: AC
  Administered 2024-04-14 – 2024-04-15 (×4): 7.5 mg via INTRAVENOUS
  Filled 2024-04-14 (×4): qty 1

## 2024-04-14 MED ORDER — HYDROMORPHONE HCL 1 MG/ML IJ SOLN: 0.5000 mg | INTRAMUSCULAR | Status: DC | PRN

## 2024-04-14 MED ORDER — SUGAMMADEX SODIUM 200 MG/2ML IV SOLN
INTRAVENOUS | Status: DC | PRN
  Administered 2024-04-14: 160 mg via INTRAVENOUS

## 2024-04-14 MED ORDER — CEFAZOLIN SODIUM-DEXTROSE 2-4 GM/100ML-% IV SOLN
2.0000 g | Freq: Once | INTRAVENOUS | Status: AC
  Administered 2024-04-14: 2 g via INTRAVENOUS

## 2024-04-14 MED ORDER — CEFAZOLIN SODIUM-DEXTROSE 2-4 GM/100ML-% IV SOLN
2.0000 g | Freq: Four times a day (QID) | INTRAVENOUS | Status: AC
  Administered 2024-04-14 – 2024-04-15 (×2): 2 g via INTRAVENOUS
  Filled 2024-04-14 (×2): qty 100

## 2024-04-14 MED ORDER — PHENYLEPHRINE 80 MCG/ML (10ML) SYRINGE FOR IV PUSH (FOR BLOOD PRESSURE SUPPORT)
PREFILLED_SYRINGE | INTRAVENOUS | Status: AC
  Filled 2024-04-14: qty 10

## 2024-04-14 MED ORDER — ORAL CARE MOUTH RINSE: 15.0000 mL | Freq: Once | OROMUCOSAL | Status: AC

## 2024-04-14 MED ORDER — METOCLOPRAMIDE HCL 5 MG PO TABS: 5.0000 mg | ORAL_TABLET | Freq: Three times a day (TID) | ORAL | Status: DC | PRN

## 2024-04-14 MED ORDER — INFLUENZA VAC SPLIT HIGH-DOSE 0.5 ML IM SUSY
0.5000 mL | PREFILLED_SYRINGE | INTRAMUSCULAR | Status: AC
  Administered 2024-04-15: 0.5 mL via INTRAMUSCULAR
  Filled 2024-04-14: qty 0.5

## 2024-04-14 MED ORDER — DEXAMETHASONE SODIUM PHOSPHATE 10 MG/ML IJ SOLN
INTRAMUSCULAR | Status: DC | PRN
  Administered 2024-04-14: 10 mg via INTRAVENOUS

## 2024-04-14 MED ORDER — ROCURONIUM BROMIDE 10 MG/ML (PF) SYRINGE
PREFILLED_SYRINGE | INTRAVENOUS | Status: AC
  Filled 2024-04-14: qty 10

## 2024-04-14 MED ORDER — OXYCODONE HCL 5 MG PO TABS: 5.0000 mg | ORAL_TABLET | ORAL | Status: DC | PRN

## 2024-04-14 MED ORDER — METOCLOPRAMIDE HCL 5 MG/ML IJ SOLN: 5.0000 mg | Freq: Three times a day (TID) | INTRAMUSCULAR | Status: DC | PRN

## 2024-04-14 MED ORDER — HYDROMORPHONE HCL 1 MG/ML IJ SOLN
0.5000 mg | Freq: Once | INTRAMUSCULAR | Status: AC
  Administered 2024-04-14: 0.5 mg via INTRAVENOUS
  Filled 2024-04-14: qty 1

## 2024-04-14 MED ORDER — ROCURONIUM BROMIDE 10 MG/ML (PF) SYRINGE
PREFILLED_SYRINGE | INTRAVENOUS | Status: DC | PRN
  Administered 2024-04-14: 60 mg via INTRAVENOUS
  Administered 2024-04-14: 20 mg via INTRAVENOUS

## 2024-04-14 MED ORDER — OXYCODONE HCL 5 MG PO TABS: 5.0000 mg | ORAL_TABLET | Freq: Once | ORAL | Status: DC | PRN

## 2024-04-14 MED ORDER — CEFAZOLIN SODIUM-DEXTROSE 2-4 GM/100ML-% IV SOLN
INTRAVENOUS | Status: AC
  Filled 2024-04-14: qty 100

## 2024-04-14 MED ORDER — DEXMEDETOMIDINE HCL IN NACL 80 MCG/20ML IV SOLN
INTRAVENOUS | Status: AC
  Filled 2024-04-14: qty 20

## 2024-04-14 MED ORDER — ONDANSETRON HCL 4 MG/2ML IJ SOLN
INTRAMUSCULAR | Status: AC
  Filled 2024-04-14: qty 2

## 2024-04-14 MED ORDER — 0.9 % SODIUM CHLORIDE (POUR BTL) OPTIME
TOPICAL | Status: DC | PRN
  Administered 2024-04-14: 1000 mL

## 2024-04-14 MED ORDER — LACTATED RINGERS IV SOLN: INTRAVENOUS | Status: DC

## 2024-04-14 MED ORDER — ACETAMINOPHEN 325 MG PO TABS: 325.0000 mg | ORAL_TABLET | Freq: Four times a day (QID) | ORAL | Status: DC | PRN

## 2024-04-14 MED ORDER — ACETAMINOPHEN 500 MG PO TABS
1000.0000 mg | ORAL_TABLET | Freq: Four times a day (QID) | ORAL | Status: DC
  Administered 2024-04-14 – 2024-04-17 (×10): 1000 mg via ORAL
  Filled 2024-04-14 (×11): qty 2

## 2024-04-14 MED ORDER — SODIUM CHLORIDE 0.9 % IV SOLN: INTRAVENOUS | Status: AC

## 2024-04-14 MED ORDER — DIAZEPAM 2 MG PO TABS
2.0000 mg | ORAL_TABLET | Freq: Three times a day (TID) | ORAL | Status: DC | PRN
  Administered 2024-04-15: 2 mg via ORAL
  Filled 2024-04-14: qty 1

## 2024-04-14 MED ORDER — FENTANYL CITRATE (PF) 250 MCG/5ML IJ SOLN
INTRAMUSCULAR | Status: DC | PRN
  Administered 2024-04-14: 50 ug via INTRAVENOUS
  Administered 2024-04-14: 200 ug via INTRAVENOUS

## 2024-04-14 MED ORDER — ACETAMINOPHEN 10 MG/ML IV SOLN: 1000.0000 mg | Freq: Once | INTRAVENOUS | Status: DC | PRN

## 2024-04-14 MED ORDER — CHLORHEXIDINE GLUCONATE 0.12 % MT SOLN
OROMUCOSAL | Status: AC
  Filled 2024-04-14: qty 15

## 2024-04-14 MED ORDER — ONDANSETRON HCL 4 MG/2ML IJ SOLN
INTRAMUSCULAR | Status: DC | PRN
  Administered 2024-04-14: 4 mg via INTRAVENOUS

## 2024-04-14 MED ORDER — LIDOCAINE 2% (20 MG/ML) 5 ML SYRINGE
INTRAMUSCULAR | Status: AC
  Filled 2024-04-14: qty 5

## 2024-04-14 SURGICAL SUPPLY — 67 items
BAG COUNTER SPONGE SURGICOUNT (BAG) ×1 IMPLANT
BENZOIN TINCTURE PRP APPL 2/3 (GAUZE/BANDAGES/DRESSINGS) ×2 IMPLANT
BIT DRILL 3.2XCALB NS DISP (BIT) IMPLANT
BIT DRILL CALIBRATED 2.7 (BIT) IMPLANT
BNDG GAUZE DERMACEA FLUFF 4 (GAUZE/BANDAGES/DRESSINGS) ×2 IMPLANT
BRUSH SCRUB EZ PLAIN DRY (MISCELLANEOUS) ×2 IMPLANT
COVER SURGICAL LIGHT HANDLE (MISCELLANEOUS) ×2 IMPLANT
DRAPE C-ARM 42X72 X-RAY (DRAPES) ×1 IMPLANT
DRAPE C-ARMOR (DRAPES) ×1 IMPLANT
DRAPE IMP U-DRAPE 54X76 (DRAPES) ×1 IMPLANT
DRAPE INCISE IOBAN 66X45 STRL (DRAPES) IMPLANT
DRAPE SURG 17X11 SM STRL (DRAPES) ×2 IMPLANT
DRAPE SURG ORHT 6 SPLT 77X108 (DRAPES) ×2 IMPLANT
DRAPE U-SHAPE 47X51 STRL (DRAPES) ×2 IMPLANT
DRSG ADAPTIC 3X8 NADH LF (GAUZE/BANDAGES/DRESSINGS) ×1 IMPLANT
DRSG MEPILEX POST OP 4X8 (GAUZE/BANDAGES/DRESSINGS) IMPLANT
ELECTRODE REM PT RTRN 9FT ADLT (ELECTROSURGICAL) ×1 IMPLANT
EVACUATOR 1/8 PVC DRAIN (DRAIN) IMPLANT
GAUZE PAD ABD 8X10 STRL (GAUZE/BANDAGES/DRESSINGS) ×1 IMPLANT
GAUZE SPONGE 4X4 12PLY STRL (GAUZE/BANDAGES/DRESSINGS) ×2 IMPLANT
GLOVE BIO SURGEON STRL SZ7.5 (GLOVE) ×1 IMPLANT
GLOVE BIO SURGEON STRL SZ8 (GLOVE) ×1 IMPLANT
GLOVE BIOGEL PI IND STRL 7.5 (GLOVE) ×1 IMPLANT
GLOVE BIOGEL PI IND STRL 8 (GLOVE) ×1 IMPLANT
GLOVE SURG ORTHO LTX SZ7.5 (GLOVE) ×2 IMPLANT
GOWN STRL REUS W/ TWL LRG LVL3 (GOWN DISPOSABLE) ×2 IMPLANT
GOWN STRL REUS W/ TWL XL LVL3 (GOWN DISPOSABLE) ×1 IMPLANT
GOWN STRL REUS W/TWL 2XL LVL3 (GOWN DISPOSABLE) ×1 IMPLANT
KIT BASIN OR (CUSTOM PROCEDURE TRAY) ×1 IMPLANT
KIT TURNOVER KIT B (KITS) ×1 IMPLANT
KWIRE .045X4 (WIRE) IMPLANT
KWIRE 2X5 SS THRDED S3 (WIRE) IMPLANT
LOOP VASCLR MAXI BLUE 18IN ST (MISCELLANEOUS) IMPLANT
MANIFOLD NEPTUNE II (INSTRUMENTS) ×1 IMPLANT
NS IRRIG 1000ML POUR BTL (IV SOLUTION) ×1 IMPLANT
PACK TOTAL JOINT (CUSTOM PROCEDURE TRAY) ×1 IMPLANT
PACK UNIVERSAL I (CUSTOM PROCEDURE TRAY) ×1 IMPLANT
PAD ARMBOARD POSITIONER FOAM (MISCELLANEOUS) ×2 IMPLANT
PEG LOCKING 3.2MMX44 (Peg) IMPLANT
PEG LOCKING 3.2MMX56 (Peg) IMPLANT
PEG LOCKING 3.2X36 (Screw) IMPLANT
PEG LOCKING 3.2X40 (Peg) IMPLANT
PEG LOCKING 3.2X42 (Screw) IMPLANT
PLATE PROX HUM LO L 7H 133 (Plate) IMPLANT
SCREW LOCK CORT STAR 3.5X26 (Screw) IMPLANT
SCREW LOCK CORT STAR 3.5X30 (Screw) IMPLANT
SCREW LOCK CORT STAR 3.5X36 (Screw) IMPLANT
SCREW LOW PROFILE 3.5X30MM TIS (Screw) IMPLANT
SCREW LP NL T15 3.5X26 (Screw) IMPLANT
SCREW T15 LP CORT 3.5X56MM NS (Screw) IMPLANT
SPONGE T-LAP 18X18 ~~LOC~~+RFID (SPONGE) IMPLANT
STAPLER SKIN PROX 35W (STAPLE) ×1 IMPLANT
STOCKINETTE IMPERVIOUS LG (DRAPES) ×1 IMPLANT
SUCTION TUBE FRAZIER 10FR DISP (SUCTIONS) ×1 IMPLANT
SUT ETHIBOND 5 LR DA (SUTURE) ×1 IMPLANT
SUT ETHILON 2 0 PSLX (SUTURE) IMPLANT
SUT PDS AB 2-0 CT1 27 (SUTURE) IMPLANT
SUT VIC AB 0 CT1 27XBRD ANBCTR (SUTURE) ×2 IMPLANT
SUT VIC AB 2-0 CT1 TAPERPNT 27 (SUTURE) ×2 IMPLANT
SUT VIC AB 2-0 CT3 27 (SUTURE) IMPLANT
SUTURE FIBERWR #2 38 T-5 BLUE (SUTURE) IMPLANT
SYR 5ML LL (SYRINGE) IMPLANT
TOWEL GREEN STERILE (TOWEL DISPOSABLE) ×2 IMPLANT
TOWEL GREEN STERILE FF (TOWEL DISPOSABLE) ×1 IMPLANT
TRAY FOLEY MTR SLVR 16FR STAT (SET/KITS/TRAYS/PACK) IMPLANT
WATER STERILE IRR 1000ML POUR (IV SOLUTION) ×1 IMPLANT
YANKAUER SUCT BULB TIP NO VENT (SUCTIONS) IMPLANT

## 2024-04-14 NOTE — Transfer of Care (Signed)
 Immediate Anesthesia Transfer of Care Note  Patient: Jason Myers  Procedure(s) Performed: OPEN REDUCTION INTERNAL FIXATION (ORIF) PROXIMAL HUMERUS FRACTURE (Left)  Patient Location: PACU  Anesthesia Type:General  Level of Consciousness: drowsy, patient cooperative, and responds to stimulation  Airway & Oxygen Therapy: Patient Spontanous Breathing and Patient connected to face mask oxygen  Post-op Assessment: Report given to RN and Post -op Vital signs reviewed and stable  Post vital signs: Reviewed and stable  Last Vitals:  Vitals Value Taken Time  BP 129/93 04/14/24 13:44  Temp 36.4 C 04/14/24 13:44  Pulse 68 04/14/24 13:44  Resp 10 04/14/24 13:44  SpO2 99 % 04/14/24 13:44  Vitals shown include unfiled device data.  Last Pain:  Vitals:   04/14/24 1016  TempSrc: Oral  PainSc:          Complications: No notable events documented.

## 2024-04-14 NOTE — Anesthesia Preprocedure Evaluation (Signed)
 Anesthesia Evaluation  Patient identified by MRN, date of birth, ID band Patient awake    Reviewed: Allergy & Precautions, NPO status , Patient's Chart, lab work & pertinent test results  History of Anesthesia Complications Negative for: history of anesthetic complications  Airway Mallampati: III  TM Distance: >3 FB Neck ROM: Full    Dental  (+) Missing, Dental Advisory Given,    Pulmonary neg shortness of breath, neg sleep apnea, neg COPD, neg recent URI, former smoker   breath sounds clear to auscultation       Cardiovascular (-) hypertension+CHF  (-) Past MI  Rhythm:Regular  Left ventricle: The cavity size was normal. Wall thickness was    increased in a pattern of moderate LVH. Systolic function was    normal. The estimated ejection fraction was in the range of 60%    to 65%. Incoordinate septal motion. Doppler parameters are    consistent with abnormal left ventricular relaxation (grade 1    diastolic dysfunction). The E/e&' ratio is <8, suggesting normal    LV filling pressure.  - Mitral valve: Mildly thickened leaflets . There was trivial    regurgitation. Valve area by pressure half-time: 0.69 cm^2.  - Left atrium: The atrium was normal in size.  - Right ventricle: The cavity size was normal. Wall thickness was    normal. The moderator band was prominent. Systolic function is    moderately reduced.  - Tricuspid valve: There was mild regurgitation.  - Pulmonary arteries: PA peak pressure: 46 mm Hg (S).  - Inferior vena cava: The vessel was normal in size. The    respirophasic diameter changes were in the normal range (>= 50%),    consistent with normal central venous pressure.     Neuro/Psych Left weakness/contracture UE CVA, Residual Symptoms    GI/Hepatic negative GI ROS, Neg liver ROS,,,  Endo/Other  negative endocrine ROS    Renal/GU Renal diseaseLab Results      Component                Value                Date                      NA                       139                 04/14/2024                K                        3.7                 04/14/2024                CO2                      21 (L)              04/14/2024                GLUCOSE                  106 (H)             04/14/2024  BUN                      23                  04/14/2024                CREATININE               1.07                04/14/2024                CALCIUM                   9.1                 04/14/2024                EGFR                     75                  04/30/2021                GFRNONAA                 >60                 04/14/2024                Musculoskeletal  LEFT HUMERUS FRACTURE   Abdominal   Peds  Hematology negative hematology ROS (+) Lab Results      Component                Value               Date                      WBC                      6.6                 04/14/2024                HGB                      14.9                04/14/2024                HCT                      45.2                04/14/2024                MCV                      93.2                04/14/2024                PLT                      216                 04/14/2024  Anesthesia Other Findings   Reproductive/Obstetrics                              Anesthesia Physical Anesthesia Plan  ASA: 3  Anesthesia Plan: General   Post-op Pain Management: Ofirmev  IV (intra-op)* and Toradol  IV (intra-op)*   Induction: Intravenous  PONV Risk Score and Plan: 2 and Ondansetron  and Dexamethasone   Airway Management Planned: Oral ETT  Additional Equipment: None  Intra-op Plan:   Post-operative Plan: Extubation in OR  Informed Consent: I have reviewed the patients History and Physical, chart, labs and discussed the procedure including the risks, benefits and alternatives for the proposed anesthesia with the patient or authorized representative  who has indicated his/her understanding and acceptance.     Dental advisory given  Plan Discussed with: CRNA  Anesthesia Plan Comments:         Anesthesia Quick Evaluation

## 2024-04-14 NOTE — ED Provider Notes (Signed)
 [place] [facility] Provider Note  CSN: [id number] Arrival date & time: 04/14/24 9773  Chief Complaint(s) Fall ; L shoulder Injury  HPI Jason Myers is a 73 y.o. 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  Patient reports having to go to the restroom, and while trying to pull his pants up, losing balance and falling onto his left elbow.  Pain is described as mild ache.  He denies any head trauma or loss of consciousness.  No neck pain or back pain.  No chest pain but patient does report having rib fractures there from a fall several weeks ago.  No abdominal pain.  No hip pain or other extremity pain.  The history is provided by the patient and the EMS personnel.    Past Medical History Past Medical History:  Diagnosis Date   BPH (benign prostatic hyperplasia)    Chronic diastolic CHF (congestive heart failure) (HCC) 01/16/2018   CVA (cerebral vascular accident) (HCC) 1984   left side affected.     Hypercholesterolemia    Pulmonary embolism (HCC)    2018, 2019, 2022   SBO (small bowel obstruction) (HCC) 04/2018   Patient Active Problem List   Diagnosis Date Noted   Acute pulmonary embolism without acute cor pulmonale (HCC) 12/16/2020   Pulmonary embolism (HCC)    Hypercholesterolemia    Abdominal pain 05/07/2018   Acute lower UTI 05/07/2018   SBO (small bowel obstruction) (HCC) 05/07/2018   Chronic diastolic CHF (congestive heart failure) (HCC) 01/16/2018   Acute on chronic diastolic CHF (congestive heart failure) (HCC) 01/16/2018   Hypoxia 01/09/2018   AKI (acute kidney injury) (HCC) 01/09/2018   Hypotension 01/09/2018   PE (pulmonary thromboembolism) (HCC) 11/13/2016   BPH (benign prostatic hyperplasia) 11/13/2016    Thyroid  nodule 11/13/2016   HLD (hyperlipidemia) 06/08/2007   HEMORRHOIDS 06/08/2007   CVA (cerebral vascular accident) (HCC) 1984   Home Medication(s) Prior to Admission medications   Medication Sig Start Date End Date Taking? Authorizing Provider  apixaban  (ELIQUIS ) 5 MG TABS tablet Take 2 tablets (10mg ) twice daily for 7 days, then 1 tablet (5mg ) twice daily 12/18/20  Yes Gherghe, Costin M, MD  naproxen sodium (ALEVE) 220 MG tablet Take 440 mg by mouth 2 (two) times daily as needed (back pain).   Yes [provider]  rosuvastatin  (CRESTOR ) 10 MG tablet Take 10 mg by mouth daily. 09/28/16  Yes [provider]  tamsulosin  (FLOMAX ) 0.4 MG CAPS capsule Take 0.4 mg by mouth at bedtime.  09/28/16  Yes [provider]  Allergies Patient has no known allergies.  Review of Systems Review of Systems As noted in HPI  Physical Exam Vital Signs  I have reviewed the triage vital signs BP 109/79   Pulse 71   Temp 98.1 F (36.7 C) (Temporal)   Resp (!) 21   SpO2 92%   Physical Exam Constitutional:      General: He is not in acute distress.    Appearance: He is well-developed. He is not diaphoretic.  HENT:     Head: Normocephalic.     Right Ear: External ear normal.     Left Ear: External ear normal.  Eyes:     General: No scleral icterus.       Right eye: No discharge.        Left eye: No discharge.     Conjunctiva/sclera: Conjunctivae normal.     Pupils: Pupils are equal, round, and reactive to light.  Cardiovascular:     Rate and Rhythm: Regular rhythm.     Pulses:          Radial pulses are 2+ on the right side and 2+ on the left side.       Dorsalis pedis pulses are 2+ on the right side and 2+ on the left side.     Heart sounds: Normal heart sounds. No murmur heard.    No friction rub. No gallop.  Pulmonary:     Effort:  Pulmonary effort is normal. No respiratory distress.     Breath sounds: Normal breath sounds. No stridor.  Abdominal:     General: There is no distension.     Palpations: Abdomen is soft.     Tenderness: There is no abdominal tenderness.  Musculoskeletal:     Left shoulder: Deformity, tenderness and bony tenderness present. No laceration. Decreased range of motion. Decreased strength (baseline paralysis with contracture). Normal pulse.     Cervical back: Normal range of motion and neck supple. No bony tenderness.     Thoracic back: No bony tenderness.     Lumbar back: No bony tenderness.     Comments: Clavicle stable. Chest stable to AP/Lat compression. Pelvis stable to Lat compression. No chest or abdominal wall contusion.  Skin:    General: Skin is warm.  Neurological:     Mental Status: He is alert and oriented to person, place, and time.     GCS: GCS eye subscore is 4. GCS verbal subscore is 5. GCS motor subscore is 6.     Comments: Moving all extremities      ED Results and Treatments Labs (all labs ordered are listed, but only abnormal results are displayed) Labs Reviewed  BASIC METABOLIC PANEL WITH GFR - Abnormal; Notable for the following components:      Result Value   CO2 21 (*)    Glucose, Bld 106 (*)    All other components within normal limits  CBG MONITORING, ED - Abnormal; Notable for the following components:   Glucose-Capillary 107 (*)    All other components within normal limits  CBC WITH DIFFERENTIAL/PLATELET  PROTIME-INR  EKG  EKG Interpretation Date/Time:  Sunday April 14 2024 04:01:09 EDT Ventricular Rate:  79 PR Interval:  146 QRS Duration:  107 QT Interval:  388 QTC Calculation: 445 R Axis:   41  Text Interpretation: Sinus rhythm Multiple ventricular premature complexes Low voltage, precordial leads Borderline T wave  abnormalities Otherwise no significant change Confirmed by Trine Likes [phone]) on 04/14/2024 5:12:33 AM       Radiology DG Chest Port 1 View Result Date: 04/14/2024 CLINICAL DATA:  Chest pain. EXAM: PORTABLE CHEST 1 VIEW COMPARISON:  03/02/2024. FINDINGS: Similar elevated left hemidiaphragm. No consolidation. No visible pleural effusion or pneumothorax. Similar cardiomediastinal silhouette. Partially imaged left humerus fracture, better characterized on concurrent shoulder radiographs. IMPRESSION: Left humerus and left fourth and fifth rib fractures. No visible pneumothorax. Clear lungs. Electronically Signed   By: Gilmore GORMAN Molt M.D.   On: 04/14/2024 03:33   DG Shoulder Left Result Date: 04/14/2024 EXAM: 1 VIEW XRAY OF THE LEFT SHOULDER 04/14/2024 03:02:00 AM COMPARISON: None available. CLINICAL HISTORY: 809823 Fall 190176. fall FINDINGS: BONES AND JOINTS: Acute comminuted displaced fracture of the surgical neck of the left humerus. There is lateral displacement of the dominant distal fragment 1/2 shaft width. The humeral head remains seated in the glenoid fossa. Additional mildly displaced left fourth and fifth rib fractures. SOFT TISSUES: No abnormal calcifications. Visualized lung is unremarkable. IMPRESSION: 1. Acute comminuted displaced fracture of the surgical neck of the left humerus . 2. Mildly displaced left fourth and fifth rib fractures. Electronically signed by: Norman Gatlin MD 04/14/2024 03:07 AM EDT RP Workstation: [id number]    Medications Ordered in ED Medications  HYDROmorphone  (DILAUDID ) injection 0.5 mg (has no administration in time range)  HYDROmorphone  (DILAUDID ) injection 0.5 mg (0.5 mg Intravenous Given 04/14/24 0328)   Procedures Procedures  (including critical care time) Medical Decision Making / ED Course   Medical Decision Making Amount and/or Complexity of Data Reviewed Labs: ordered. Decision-making details documented in ED Course. Radiology: ordered and  independent interpretation performed. Decision-making details documented in ED Course. ECG/medicine tests: ordered and independent interpretation performed. Decision-making details documented in ED Course.  Risk Prescription drug management. Parenteral controlled substances. Decision regarding hospitalization.    Mechanical fall resulting in left shoulder deformity. HDS No evidence of head trauma.  He denies any headache or neck pain requiring CT imaging. X-ray of the left shoulder notable for displaced 2 part fracture of the humeral neck.  Pulses intact and sensation intact distally.  Baseline left upper extremity paralysis, unchanged.  X-rays also notable for left rib fractures.  These were their since July from prior fall.  Clinical Course as of 04/14/24 9293  [place] Apr 14, 2024  0402 Spoke with Dr. Celena from orthopedic surgery who will evaluate patient for operative management later on today given that he is off of his Eliquis .  Preop labs EKG and imaging ordered. [PC]  [id number] Labs reassuring without leukocytosis or anemia.  No significant electrolyte derangements or renal sufficiency. [PC]  [id number] Patient care turned over to oncoming provider. Patient case and results discussed in detail; please see their note for further ED managment.     [PC]    Clinical Course User Index [PC] Novelle Addair, Likes Moder, MD     Final Clinical Impression(s) / ED Diagnoses Final diagnoses:  2-part displaced fracture of surgical neck of left humerus, initial encounter for closed fracture    This chart was dictated using voice recognition software.  Despite best efforts to proofread,  errors can occur  which can change the documentation meaning.    Trine Raynell Moder, MD 04/14/24 [phone]

## 2024-04-14 NOTE — Consult Note (Signed)
 Orthopaedic Trauma Service (OTS) Consult   Patient ID: Jason Myers MRN: [id number] DOB/AGE: 74-09-1950 74 y.o.   Reason for Consult: Left proximal humerus fracture, history of left side hemiplegia Referring Physician: Raynell Naegeli, MD    HPI: Jason Myers is an 73 y.o. right-hand-dominant male with a history of left-sided hemiplegia from CVA sustained back in the 53s.  Patient has left upper extremity contractures as well as left lower extremity contractures.  He was at home last night when he got up to use the bathroom.  His pants got caught around his legs causing him to fall striking his left arm.  Patient had immediate onset of pain in his left upper extremity.  He presented to the emergency room for evaluation via EMS.  Workup was notable for a left proximal humerus fracture.  Orthopedics was consulted for evaluation  Patient seen and evaluated in the emergency department room 2 complains only of pain in his left shoulder.  At baseline he has severe contractures to his left upper extremity and has minimal use of his left upper extremity.  States that he can chicken wing his arm to turn off a light switch but other than that he really does not use it.  Pain is exacerbated with motion relieved with rest.  No worsening of his pain since admission to the ED.  Patient lives alone.  States he uses a cane to get around.  He does drive.  Previously on Eliquis  but has not taken in several weeks  Last meal was last night, 04/13/2024   Past Medical History:  Diagnosis Date   BPH (benign prostatic hyperplasia)    Chronic diastolic CHF (congestive heart failure) (HCC) 01/16/2018   CVA (cerebral vascular accident) (HCC) 1984   left side affected.     Hypercholesterolemia    Pulmonary embolism (HCC)    2018, 2019, 2022   SBO (small bowel obstruction) (HCC) 04/2018    Past Surgical History:  Procedure Laterality Date   LIPOMA EXCISION Left    groin    Family History   Problem Relation Age of Onset   Alcoholism Mother    Kidney Stones Father     Social History:  reports that he has quit smoking. He has never used smokeless tobacco. He reports current alcohol use. He reports current drug use. Drug: Marijuana.  Allergies: No Known Allergies  Medications: I have reviewed the patient's current medications. Current Outpatient Medications  Medication Instructions   apixaban  (ELIQUIS ) 5 MG TABS tablet Take 2 tablets (10mg ) twice daily for 7 days, then 1 tablet (5mg ) twice daily   naproxen sodium (ALEVE) 440 mg, 2 times daily PRN   rosuvastatin  (CRESTOR ) 10 mg, Daily   tamsulosin  (FLOMAX ) 0.4 mg, Daily at bedtime     Results for orders placed or performed during the hospital encounter of 04/14/24 (from the past 48 hours)  CBC with Differential     Status: None   Collection Time: 04/14/24  3:20 AM  Result Value Ref Range   WBC 6.6 4.0 - 10.5 K/uL   RBC 4.85 4.22 - 5.81 MIL/uL   Hemoglobin 14.9 13.0 - 17.0 g/dL   HCT 54.7 60.9 - 47.9 %   MCV 93.2 80.0 - 100.0 fL   MCH 30.7 26.0 - 34.0 pg   MCHC 33.0 30.0 - 36.0 g/dL   RDW 86.7 88.4 - 84.4 %   Platelets 216 150 - 400 K/uL   nRBC 0.0 0.0 -  0.2 %   Neutrophils Relative % 65 %   Neutro Abs 4.3 1.7 - 7.7 K/uL   Lymphocytes Relative 25 %   Lymphs Abs 1.6 0.7 - 4.0 K/uL   Monocytes Relative 8 %   Monocytes Absolute 0.5 0.1 - 1.0 K/uL   Eosinophils Relative 2 %   Eosinophils Absolute 0.1 0.0 - 0.5 K/uL   Basophils Relative 0 %   Basophils Absolute 0.0 0.0 - 0.1 K/uL   Immature Granulocytes 0 %   Abs Immature Granulocytes 0.02 0.00 - 0.07 K/uL    Comment: Performed at [facility], [address], [place], [place] [address]  Basic metabolic panel     Status: Abnormal   Collection Time: 04/14/24  3:20 AM  Result Value Ref Range   Sodium 139 135 - 145 mmol/L   Potassium 3.7 3.5 - 5.1 mmol/L   Chloride 105 98 - 111 mmol/L   CO2 21 (L) 22 - 32 mmol/L   Glucose, Bld 106 (H) 70 - 99 mg/dL     Comment: Glucose reference range applies only to samples taken after fasting for at least 8 hours.   BUN 23 8 - 23 mg/dL   Creatinine, Ser 8.92 0.61 - 1.24 mg/dL   Calcium  9.1 8.9 - 10.3 mg/dL   GFR, Estimated >39 >39 mL/min    Comment: (NOTE) Calculated using the CKD-EPI Creatinine Equation (2021)    Anion gap 13 5 - 15    Comment: Performed at [facility], [address], [place], [place] [address]  Protime-INR     Status: None   Collection Time: 04/14/24  3:20 AM  Result Value Ref Range   Prothrombin Time 13.7 11.4 - 15.2 seconds   INR 1.0 0.8 - 1.2    Comment: (NOTE) INR goal varies based on device and disease states. Performed at [facility], [address] [place], [place] [address]   CBG monitoring, ED     Status: Abnormal   Collection Time: 04/14/24  7:01 AM  Result Value Ref Range   Glucose-Capillary 107 (H) 70 - 99 mg/dL    Comment: Glucose reference range applies only to samples taken after fasting for at least 8 hours.  CBG monitoring, ED     Status: None   Collection Time: 04/14/24  8:15 AM  Result Value Ref Range   Glucose-Capillary 98 70 - 99 mg/dL    Comment: Glucose reference range applies only to samples taken after fasting for at least 8 hours.   Comment 1 Notify RN    Comment 2 Document in Chart     CT SHOULDER LEFT WO CONTRAST Result Date: 04/14/2024 CLINICAL DATA:  Left shoulder pain after fall. EXAM: CT OF THE UPPER LEFT EXTREMITY WITHOUT CONTRAST TECHNIQUE: Multidetector CT imaging of the upper left extremity was performed according to the standard protocol. RADIATION DOSE REDUCTION: This exam was performed according to the departmental dose-optimization program which includes automated exposure control, adjustment of the mA and/or kV according to patient size and/or use of iterative reconstruction technique. COMPARISON:  Radiograph same day. FINDINGS: Severely displaced fracture is seen involving the surgical neck of the proximal left humerus  which may be minimally comminuted. No dislocation of humeral head is noted. Scapula is unremarkable. Mildly displaced fractures of lateral portions of left fourth and fifth ribs is noted. IMPRESSION: 1. Severely displaced fracture is seen involving the surgical neck of the proximal left humerus which may be minimally comminuted. 2. Mildly displaced  fractures of lateral portions of left fourth and fifth ribs. Electronically Signed   By: Lynwood Landy Raddle M.D.   On: 04/14/2024 09:14   DG Chest Port 1 View Result Date: 04/14/2024 CLINICAL DATA:  Chest pain. EXAM: PORTABLE CHEST 1 VIEW COMPARISON:  03/02/2024. FINDINGS: Similar elevated left hemidiaphragm. No consolidation. No visible pleural effusion or pneumothorax. Similar cardiomediastinal silhouette. Partially imaged left humerus fracture, better characterized on concurrent shoulder radiographs. IMPRESSION: Left humerus and left fourth and fifth rib fractures. No visible pneumothorax. Clear lungs. Electronically Signed   By: Gilmore GORMAN Molt M.D.   On: 04/14/2024 03:33   DG Shoulder Left Result Date: 04/14/2024 EXAM: 1 VIEW XRAY OF THE LEFT SHOULDER 04/14/2024 03:02:00 AM COMPARISON: None available. CLINICAL HISTORY: 809823 Fall 190176. fall FINDINGS: BONES AND JOINTS: Acute comminuted displaced fracture of the surgical neck of the left humerus. There is lateral displacement of the dominant distal fragment 1/2 shaft width. The humeral head remains seated in the glenoid fossa. Additional mildly displaced left fourth and fifth rib fractures. SOFT TISSUES: No abnormal calcifications. Visualized lung is unremarkable. IMPRESSION: 1. Acute comminuted displaced fracture of the surgical neck of the left humerus . 2. Mildly displaced left fourth and fifth rib fractures. Electronically signed by: Norman Gatlin MD 04/14/2024 03:07 AM EDT RP Workstation: [id number]    Intake/Output    None      ROS As above  Blood pressure 109/79, pulse 72, temperature 98.1 F  (36.7 C), temperature source Temporal, resp. rate (!) 23, SpO2 97%. Physical Exam Vitals and nursing note reviewed.  Constitutional:      General: He is awake. He is not in acute distress.    Comments: Pleasant 73 year old male  Cardiovascular:     Heart sounds: S1 normal and S2 normal.  Pulmonary:     Comments: Unlabored Musculoskeletal:     Comments:  Left upper extremity Chronic contractures noted to left upper extremity  humeral shaft quite visible under the skin.  It is tenting the skin but the skin is mobile and does not have any evidence of pressure necrosis or threatened soft tissue Patient has pain with range of motion of his shoulder Instability at fracture site is noted Good perfusion distally Palpable radial pulse Wrist elbow are held in flexion due to chronic deformity Diffuse sarcopenia/muscle wasting left upper extremity  Left lower extremity notable for flexion contracture at his ankle.  I am unable to get him passively to neutral Pressure area noted on the dorsum of his foot but skin does not appear overly threatened Extremities warm + DP pulse   Skin:    General: Skin is warm.     Capillary Refill: Capillary refill takes less than 2 seconds.  Neurological:     Mental Status: He is alert and oriented to person, place, and time.     Comments: Did not assess coordination or gait  Psychiatric:        Attention and Perception: Attention normal.        Mood and Affect: Mood and affect normal.        Speech: Speech normal.        Behavior: Behavior normal. Behavior is cooperative.        Cognition and Memory: Cognition normal.       Assessment/Plan:  73 year old right-hand-dominant male history of left hemiplegia with acute left proximal humerus fracture  - Fall  -Displaced left proximal humerus fracture  Reviewed findings of x-rays and CT scan with patient.  Clinical exam  shows more malalignment than the x-rays suggest.  Recommend surgical fixation to  stabilize his fracture  Risks and benefits of surgery reviewed with patient and he wishes to proceed   He is at increased risk for nonunion given diminished muscle function of his left upper extremity   OR today for ORIF left proximal humerus   Will admit postoperatively for pain control and therapies  - Pain management:  Multimodal  - ABL anemia/Hemodynamics  Stable  - Medical issues   History of stroke with left-sided hemiplegia  BPH    Will restart all medications as appropriate.  Will discuss why he has been off of his Eliquis  postoperatively   - DVT/PE prophylaxis:  Patient currently has held his Eliquis  for several weeks now   - ID:   Perioperative antibiotics  - Metabolic Bone Disease:  Check vitamin D   - Activity:  As above  - FEN/GI prophylaxis/Foley/Lines:  NPO   Advance diet postoperatively  - Dispo:  OR today for ORIF left proximal humerus  I did ask patient if he wanted me to contact his brother.  He said not to call him at this time    Francis MICAEL Mt, PA-C [phone] (C) 04/14/2024, 9:42 AM  [facility] [address] [place] [address] [phone] MAXIMINO MILLING (F)    After 5pm and on the weekends please log on to Amion, go to orthopaedics and the look under the [facility] Call for the provider(s) on call. You can also call our office at [phone] and then follow the prompts to be connected to the call team.

## 2024-04-14 NOTE — Op Note (Signed)
 04/14/2024  11:06 AM  PATIENT:  Jason Myers  73 y.o. male  PRE-OPERATIVE DIAGNOSIS:  LEFT PROXIMAL HUMERUS FRACTURE  POST-OPERATIVE DIAGNOSIS:  LEFT PROXIMAL HUMERUS FRACTURE  PROCEDURE:  OPEN REDUCTION INTERNAL FIXATION OF LEFT PROXIMAL HUMERUS FRACTURE  SURGEON:  Shawnese Magner, MD  PHYSICIAN ASSISTANT: None.  ANESTHESIA:   GENERAL   I/O:  No intake/output data recorded.  SPECIMEN:  NONE  TOURNIQUET:  NONE  COMPLICATIONS: NONE  DICTATION: Written in EPIC  DISPOSITION: TO PACU  CONDITION: STABLE  DELAY START OF DVT PROPHYLAXIS BECAUSE OF BLEEDING RISK: NO  BRIEF SUMMARY OF INDICATION FOR PROCEDURE:  Jason Myers is a right-hand dominant 73 y.o. who sustained shoulder injury in ground level fall. Despite his limited function from remote stroke and subsequent contracture, we recommended repair in order to most reliably obtain union in this completely displaced surgical neck fracture.  I discussed with the patient risks of heart attack, stroke, infection, malunion, nonunion, loss of motion, DVT/ PE, loss of reduction, avascular necrosis, screw penetration, and need for further surgery, among others. These risks were acknowledged and consent provided to proceed.  BRIEF SUMMARY OF PROCEDURE:  After preoperative antibiotics, the patient was taken to the operating room where general anesthesia was induced. Chlorhexidene wash and betadine scrub and paint were performed then standard draping. The deltopectoral approach was made after time-out.  Dissection was carried down to the humerus just lateral to the bicep tendon.  I was able to mobilize the head segment. Hematoma was evacuated with curettes and lavage. Using the long head of the biceps and the bicipital groove as landmarks, reduction maneuvers were performed while keeping the periostial attachments to all the fragments.  Pins were used provisionally followed by the plate with additional pins for further fixation.  C-arm  confirmed appropriate alignment and reduction with goals for restoration of proper head shaft orientation and tuberosity position.  This was followed by a screw fixation into the shaft in the slotted hole and then standard fixation into the head, which worked to appose the plate to the bone. The reduction of the humeral head and shaft was excellent and so we proceeded with additional fixation with locked pegs were placed into the head and primarily standard screws into the shaft.  Final images showed appropriate reduction, hardware trajectory, and length. Return of of his limited preoperative motion was feasible on the table, including abduction, and internal and external rotation, and the fracture was clearly visible and anatomically interdigitated. The wound was thoroughly irrigated and then closed in a standard layered fashion using #1 Vicryl to reapproximate the superior edge of the pec and anterior edge of the delt and then 0 for reapproximation of the muscular interval, 2-0 Vicryl and nylon for the skin.  Sterile gently compressive dressing was applied and a sling with an Ace from hand to upper arm.  The patient was taken to PACU in stable condition.  PROGNOSIS:  Jason Myers will have unrestricted gentle passive range of motion of the operative shoulder at this time with active elbow, wrist, and digital motion.  Sling would be for comfort. Bone quality was to expectation and low given lack of use and neurologic injury, which is also significant with regard to future fractures/ fall risk. He may resume his anticoagulalant.     Jason DEL. Celena, M.D.

## 2024-04-14 NOTE — ED Notes (Signed)
 [facility] in [place]

## 2024-04-14 NOTE — ED Notes (Signed)
 Report given to Devere RN of [facility] in [place] for procedure today.

## 2024-04-14 NOTE — ED Triage Notes (Signed)
 Patient lost his balance and fell in the bathroom at home this evening on his left side , no LOC , presents with left shoulder pain/deformity .

## 2024-04-14 NOTE — Anesthesia Procedure Notes (Signed)
 Procedure Name: Intubation Date/Time: 04/14/2024 11:23 AM  Performed by: Myrna Homer, CRNAPre-anesthesia Checklist: Patient identified, Emergency Drugs available, Suction available and Patient being monitored Patient Re-evaluated:Patient Re-evaluated prior to induction Oxygen Delivery Method: Circle System Utilized Preoxygenation: Pre-oxygenation with 100% oxygen Induction Type: IV induction Ventilation: Two handed mask ventilation required Laryngoscope Size: Glidescope and 3 Grade View: Grade I Tube type: Oral Tube size: 7.5 mm Number of attempts: 1 Airway Equipment and Method: Stylet and Oral airway Placement Confirmation: ETT inserted through vocal cords under direct vision, positive ETCO2 and breath sounds checked- equal and bilateral Secured at: 23 cm Tube secured with: Tape Dental Injury: Teeth and Oropharynx as per pre-operative assessment

## 2024-04-15 ENCOUNTER — Inpatient Hospital Stay (HOSPITAL_COMMUNITY)

## 2024-04-15 LAB — CBC
HCT: 43.3 % (ref 39.0–52.0)
Hemoglobin: 14.8 g/dL (ref 13.0–17.0)
MCH: 31.1 pg (ref 26.0–34.0)
MCHC: 34.2 g/dL (ref 30.0–36.0)
MCV: 91 fL (ref 80.0–100.0)
Platelets: 216 K/uL (ref 150–400)
RBC: 4.76 MIL/uL (ref 4.22–5.81)
RDW: 12.7 % (ref 11.5–15.5)
WBC: 11.4 K/uL — ABNORMAL HIGH (ref 4.0–10.5)
nRBC: 0 % (ref 0.0–0.2)

## 2024-04-15 NOTE — TOC CAGE-AID Note (Signed)
 Transition of Care [facility]) - CAGE-AID Screening   Patient Details  Name: Jason Myers MRN: [id number] Date of Birth: [birth year]  Transition of Care [facility]) CM/SW Contact:    Yang Rack E Omid Deardorff, LCSW Phone Number: 04/15/2024, 9:02 AM   Clinical Narrative: Patient states he smokes marijuana occasionally. Patient denies other SA use or resource needs.   CAGE-AID Screening:    Have You Ever Felt You Ought to Cut Down on Your Drinking or Drug Use?: No Have People Annoyed You By Critizing Your Drinking Or Drug Use?: No Have You Felt Bad Or Guilty About Your Drinking Or Drug Use?: No Have You Ever Had a Drink or Used Drugs First Thing In The Morning to Steady Your Nerves or to Get Rid of a Hangover?: No CAGE-AID Score: 0  Substance Abuse Education Offered: No

## 2024-04-15 NOTE — Plan of Care (Signed)
   Problem: Education: Goal: Knowledge of General Education information will improve Description: Including pain rating scale, medication(s)/side effects and non-pharmacologic comfort measures Outcome: Progressing   Problem: Health Behavior/Discharge Planning: Goal: Ability to manage health-related needs will improve Outcome: Progressing   Problem: Clinical Measurements: Goal: Ability to maintain clinical measurements within normal limits will improve Outcome: Progressing Goal: Will remain free from infection Outcome: Progressing Goal: Diagnostic test results will improve Outcome: Progressing Goal: Respiratory complications will improve Outcome: Progressing Goal: Cardiovascular complication will be avoided Outcome: Progressing   Problem: Activity: Goal: Risk for activity intolerance will decrease Outcome: Progressing   Problem: Nutrition: Goal: Adequate nutrition will be maintained Outcome: Progressing   Problem: Coping: Goal: Level of anxiety will decrease Outcome: Progressing   Problem: Elimination: Goal: Will not experience complications related to bowel motility Outcome: Progressing Goal: Will not experience complications related to urinary retention Outcome: Progressing   Problem: Pain Managment: Goal: General experience of comfort will improve and/or be controlled Outcome: Progressing   Problem: Safety: Goal: Ability to remain free from injury will improve Outcome: Progressing   Problem: Skin Integrity: Goal: Risk for impaired skin integrity will decrease Outcome: Progressing   Problem: Education: Goal: Knowledge of the prescribed therapeutic regimen will improve Outcome: Progressing Goal: Understanding of activity limitations/precautions following surgery will improve Outcome: Progressing Goal: Individualized Educational Video(s) Outcome: Progressing   Problem: Activity: Goal: Ability to tolerate increased activity will improve Outcome: Progressing    Problem: Pain Management: Goal: Pain level will decrease with appropriate interventions Outcome: Progressing

## 2024-04-15 NOTE — Progress Notes (Signed)
 Orthopaedic Trauma Service Progress Note  Patient ID: Jason Myers MRN: [id number] DOB/AGE: November 19, 1950 73 y.o.  Subjective:  No acute issues  Stable Mobilized reasonably well with therapy  Needs to arrange his personal care aide before he discharges    ROS As above  Today's  total administered Morphine  Milligram Equivalents: 0 Yesterday's total administered Morphine  Milligram Equivalents: 95  Objective:   VITALS:   Vitals:   04/14/24 1948 04/15/24 0509 04/15/24 0733 04/15/24 1546  BP: (!) 88/62 117/76 133/77 (!) 138/92  Pulse: 76 82 63 65  Resp: 16 16 16 17   Temp: 97.7 F (36.5 C) 98.4 F (36.9 C) (!) 97.5 F (36.4 C) 97.6 F (36.4 C)  TempSrc: Oral Oral    SpO2: 93% 95% 98% 98%  Weight:      Height:        Estimated body mass index is 23.62 kg/m as calculated from the following:   Height as of this encounter: 6' 0.99 (1.854 m).   Weight as of this encounter: 81.2 kg.   Intake/Output      09/07 0701 09/08 0700 09/08 0701 09/09 0700   P.O. 720 720   I.V. (mL/kg) 1002.7 (12.3)    IV Piggyback 200    Total Intake(mL/kg) 1922.7 (23.7) 720 (8.9)   Urine (mL/kg/hr) 1250 (0.6) 550 (0.6)   Emesis/NG output 0    Stool 0    Blood 50    Total Output 1300 550   Net +622.7 +170        Urine Occurrence 1 x    Stool Occurrence 0 x    Emesis Occurrence 0 x      LABS  Results for orders placed or performed during the hospital encounter of 04/14/24 (from the past 24 hours)  CBC     Status: Abnormal   Collection Time: 04/15/24  9:52 AM  Result Value Ref Range   WBC 11.4 (H) 4.0 - 10.5 K/uL   RBC 4.76 4.22 - 5.81 MIL/uL   Hemoglobin 14.8 13.0 - 17.0 g/dL   HCT 56.6 60.9 - 47.9 %   MCV 91.0 80.0 - 100.0 fL   MCH 31.1 26.0 - 34.0 pg   MCHC 34.2 30.0 - 36.0 g/dL   RDW 87.2 88.4 - 84.4 %   Platelets 216 150 - 400 K/uL   nRBC 0.0 0.0 - 0.2 %     PHYSICAL EXAM:   Gen: resting  comfortably in bed, looks good, joking around  Lungs: unlabored Ext:       Left upper Extremity   Dressing L upper arm clean, dry and intact  Ext warm  + radial pulse  Motor and sensory functions at baseline   Chronic deformity/contracture present    Assessment/Plan: 1 Day Post-Op   Principal Problem:   Closed fracture of left proximal humerus   Anti-infectives (From admission, onward)    Start     Dose/Rate Route Frequency Ordered Stop   04/14/24 1530  ceFAZolin  (ANCEF ) IVPB 2g/100 mL premix        2 g 200 mL/hr over 30 Minutes Intravenous Every 6 hours 04/14/24 1434 04/15/24 0047   04/14/24 0945  ceFAZolin  (ANCEF ) IVPB 2g/100 mL premix        2 g 200 mL/hr over 30 Minutes Intravenous  Once 04/14/24 0940 04/14/24  1120     .  POD/HD#: 36  74 year old right-hand-dominant male history of left hemiplegia with acute left proximal humerus fracture   - Fall   -Displaced left proximal humerus fracture s/p ORIF               No formal restrictions as his left arm is really nonfunctional at baseline    Mobilize as tolerated    Ice prn     Dressing change tomorrow    Plan for dc Wednesday    - Pain management:               Multimodal    No opioids today    - ABL anemia/Hemodynamics               Stable   - Medical issues                History of stroke with left-sided hemiplegia               BPH                - ID:                Perioperative antibiotics   - Metabolic Bone Disease:               Check vitamin D    - Activity:               As above   - FEN/GI prophylaxis/Foley/Lines:            Reg diet    - Dispo:               Continue therapies     Dc home 9/10      Francis MICAEL Mt, PA-C [phone] (C) 04/15/2024, 6:14 PM  [facility] [address] [place] [address] [phone] (F)    After 5pm and on the weekends please log on to Amion, go to orthopaedics and the look under the Sports Medicine  Group Call for the provider(s) on call. You can also call our office at [phone] and then follow the prompts to be connected to the call team.  Patient ID: Jason Myers, male   DOB: 11/11/1950, 73 y.o.   MRN: [id number]

## 2024-04-15 NOTE — Anesthesia Postprocedure Evaluation (Signed)
 Anesthesia Post Note  Patient: Jason Myers  Procedure(s) Performed: OPEN REDUCTION INTERNAL FIXATION (ORIF) PROXIMAL HUMERUS FRACTURE (Left)     Patient location during evaluation: PACU Anesthesia Type: General Level of consciousness: awake and patient cooperative Pain management: pain level controlled Vital Signs Assessment: post-procedure vital signs reviewed and stable Respiratory status: spontaneous breathing, nonlabored ventilation and respiratory function stable Cardiovascular status: blood pressure returned to baseline and stable Postop Assessment: no apparent nausea or vomiting Anesthetic complications: no   No notable events documented.                  Imaad Reuss

## 2024-04-15 NOTE — Evaluation (Signed)
 Physical Therapy Evaluation  Patient Details Name: Jason Myers MRN: [id number] DOB: [birth year] Today's Date: 04/15/2024  History of Present Illness  Pt is a 73 y/o male who presents s/p mechanical fall at home, sustaining a L proximal humerus fx. He is now s/p ORIF L humerus on 04/14/2024. PMH significant for BPH, dCHF, CVA with residual L side weakness, PE, SBO.  Clinical Impression  Pt admitted with above diagnosis. Pt currently with functional limitations due to the deficits listed below (see PT Problem List). At the time of PT eval pt was able to perform transfers with modified independence and ambulation with up to CGA and [facility] for support. Pt reports he is essentially at his baseline of function, as he has residual L sided weakness from a prior stroke. Pt reports no pain throughout evaluation, stating he has decreased sensation at baseline. Pt is interested in trying a sling for comfort - OT to order. Will follow acutely to monitor for a decline in function, however pt is safe for return home with PCA support from a PT standpoint. Will continue to follow.       If plan is discharge home, recommend the following: A little help with walking and/or transfers;A little help with bathing/dressing/bathroom;Assistance with cooking/housework;Assist for transportation;Help with stairs or ramp for entrance   Can travel by private vehicle        Equipment Recommendations None recommended by PT  Recommendations for Other Services       Functional Status Assessment Patient has not had a recent decline in their functional status     Precautions / Restrictions Precautions Precautions: Fall Recall of Precautions/Restrictions: Intact Precaution/Restrictions Comments: L side hemiplegia. UE more involved than LE. Restrictions Weight Bearing Restrictions Per Provider Order: Yes LUE Weight Bearing Per Provider Order: Non weight bearing Other Position/Activity Restrictions: Unrestricted gentle PROM of   the L shoulder at this time with active elbow, wrist, and digital  motion. Sling for comfort.      Mobility  Bed Mobility Overal bed mobility: Modified Independent             General bed mobility comments: HOB slightly elevated. No assist required to transition fully to EOB.    Transfers Overall transfer level: Needs assistance Equipment used: Straight cane Transfers: Sit to/from Stand Sit to Stand: Supervision, Modified independent (Device/Increase time)           General transfer comment: Pt asking for space as therapists were closely guarding pt as he prepared to stand. Pt was able to power up to full stand without assist. Heavily reliant on SPC. No assist required.    Ambulation/Gait Ambulation/Gait assistance: Contact guard assist Gait Distance (Feet): 175 Feet Assistive device: Straight cane Gait Pattern/deviations: Step-through pattern, Decreased stride length, Trunk flexed, Ataxic, Knee flexed in stance - left, Decreased weight shift to left, Decreased dorsiflexion - left Gait velocity: Decreased Gait velocity interpretation: 1.31 - 2.62 ft/sec, indicative of limited community ambulator   General Gait Details: Hands on guarding for safety. Pt reports he is ambulating at his baseline of functional deficits. Pt with several instances of unsteadiness, however was able to recover without assist each time.  Stairs            Wheelchair Mobility     Tilt Bed    Modified Rankin (Stroke Patients Only)       Balance Overall balance assessment: Needs assistance Sitting-balance support: Feet supported, No upper extremity supported Sitting balance-Leahy Scale: Fair     Standing balance  support: Single extremity supported, During functional activity, Reliant on assistive device for balance Standing balance-Leahy Scale: Poor                               Pertinent Vitals/Pain Pain Assessment Pain Assessment: No/denies pain    Home Living  Family/patient expects to be discharged to:: Private residence Living Arrangements: Alone Available Help at Discharge: Personal care attendant (5 days a week M-F 3.5 hours/day) Type of Home: Apartment Home Access: Elevator       Home Layout: One level Home Equipment: Grab bars - tub/shower;Grab bars - toilet;Cane - single point      Prior Function Prior Level of Function : Needs assist;Driving;History of Falls (last six months)             Mobility Comments: Ambulates with a cane, drives ADLs Comments: Microwave meals, otherwise does not cook. Goes out to shopping tuesdays, chess club on wednesdays, grocery store thursdays. Likes puzzles, reading. Pt reports independent with bathing, dressing.     Extremity/Trunk Assessment   Upper Extremity Assessment Upper Extremity Assessment: LUE deficits/detail LUE Deficits / Details: Pt with baseline LUE deficits due to prior stroke. LUE positioned in shoulder adduction, elbow flexion, wrist flexion and digit extension. Pt reports baseline decreased sensation in the LUE and only uses it to flip a light on by performing shoulder abduction and using elbow to maneuver light switch. Pt reports no pain throughout session. LUE Sensation: decreased light touch    Lower Extremity Assessment Lower Extremity Assessment: LLE deficits/detail LLE Deficits / Details: Decreased strength, AROM and coordination consistent with prior CVA.    Cervical / Trunk Assessment Cervical / Trunk Assessment: Normal  Communication   Communication Communication: No apparent difficulties    Cognition Arousal: Alert Behavior During Therapy: WFL for tasks assessed/performed   PT - Cognitive impairments: No apparent impairments                         Following commands: Intact       Cueing Cueing Techniques: Verbal cues, Gestural cues     General Comments      Exercises     Assessment/Plan    PT Assessment Patient needs continued PT  services  PT Problem List Decreased strength;Decreased activity tolerance;Decreased balance;Decreased mobility;Decreased knowledge of use of DME;Decreased knowledge of precautions;Decreased safety awareness;Pain       PT Treatment Interventions DME instruction;Gait training;Functional mobility training;Therapeutic activities;Therapeutic exercise;Balance training;Patient/family education    PT Goals (Current goals can be found in the Care Plan section)  Acute Rehab PT Goals Patient Stated Goal: Return home at d/c PT Goal Formulation: With patient Time For Goal Achievement: 04/22/24 Potential to Achieve Goals: Good    Frequency Min 1X/week     Co-evaluation PT/OT/SLP Co-Evaluation/Treatment: Yes Reason for Co-Treatment: Complexity of the patient's impairments (multi-system involvement);For patient/therapist safety;To address functional/ADL transfers PT goals addressed during session: Mobility/safety with mobility;Balance;Proper use of DME;Strengthening/ROM         AM-PAC PT 6 Clicks Mobility  Outcome Measure Help needed turning from your back to your side while in a flat bed without using bedrails?: None Help needed moving from lying on your back to sitting on the side of a flat bed without using bedrails?: None Help needed moving to and from a bed to a chair (including a wheelchair)?: None Help needed standing up from a chair using your arms (e.g., wheelchair or  bedside chair)?: None Help needed to walk in hospital room?: A Little Help needed climbing 3-5 steps with a railing? : A Lot 6 Click Score: 21    End of Session Equipment Utilized During Treatment: Gait belt Activity Tolerance: Patient tolerated treatment well Patient left: in chair;with call bell/phone within reach;with chair alarm set Nurse Communication: Mobility status PT Visit Diagnosis: Unsteadiness on feet (R26.81);History of falling (Z91.81);Difficulty in walking, not elsewhere classified (R26.2)    Time:  8951-8881 PT Time Calculation (min) (ACUTE ONLY): 30 min   Charges:   PT Evaluation $PT Eval Moderate Complexity: 1 Mod   PT General Charges $$ ACUTE PT VISIT: 1 Visit         Leita Sable, PT, DPT Acute Rehabilitation Services Secure Chat Preferred Office: [phone]   Leita JONETTA Sable 04/15/2024, 1:51 PM

## 2024-04-15 NOTE — Evaluation (Signed)
 Occupational Therapy Evaluation Patient Details Name: Jason Myers MRN: [id number] DOB: 04-05-1951 Today's Date: 04/15/2024   History of Present Illness   Pt is a 73 y/o male who presents s/p mechanical fall at home, sustaining a L proximal humerus fx. He is now s/p ORIF L humerus on 04/14/2024. PMH significant for BPH, dCHF, CVA with residual L side weakness, PE, SBO.     Clinical Impressions PTA, pt lived alone and was mod I for BADL, IADL, and driving. Pt reports he has an aide 8:30-12 daily but reports that they only sit and play games together. Upon eval, pt presents close to his baseline with unsteady gait, decreased LUE functional use, but reports it is at baseline, and decreased knowledge of precautions. Pt needing up to CGA for BADL and functional mobility. Pt to continue to benefit from acute OT services for sling application and use of compensatory techniques as well as prevention of deconditioning. Do not suspect need for OT follow up given near baseline.       If plan is discharge home, recommend the following:   A little help with walking and/or transfers;A little help with bathing/dressing/bathroom;Assistance with cooking/housework;Assist for transportation;Help with stairs or ramp for entrance     Functional Status Assessment   Patient has had a recent decline in their functional status and demonstrates the ability to make significant improvements in function in a reasonable and predictable amount of time.     Equipment Recommendations   None recommended by OT     Recommendations for Other Services         Precautions/Restrictions   Precautions Precautions: Fall Recall of Precautions/Restrictions: Intact Precaution/Restrictions Comments: L side hemiplegia. UE more involved than LE. Restrictions Weight Bearing Restrictions Per Provider Order: Yes LUE Weight Bearing Per Provider Order: Non weight bearing Other Position/Activity Restrictions: Unrestricted  gentle PROM of  the L shoulder at this time with active elbow, wrist, and digital  motion. Sling for comfort.     Mobility Bed Mobility Overal bed mobility: Modified Independent             General bed mobility comments: HOB slightly elevated. No assist required to transition fully to EOB.    Transfers Overall transfer level: Needs assistance Equipment used: Straight cane Transfers: Sit to/from Stand Sit to Stand: Supervision, Modified independent (Device/Increase time)           General transfer comment: Pt asking for space as therapists were closely guarding pt as he prepared to stand. Pt was able to power up to full stand without assist. Heavily reliant on SPC. No assist required.      Balance Overall balance assessment: Needs assistance Sitting-balance support: Feet supported, No upper extremity supported Sitting balance-Leahy Scale: Fair     Standing balance support: Single extremity supported, During functional activity, Reliant on assistive device for balance Standing balance-Leahy Scale: Poor                             ADL either performed or assessed with clinical judgement   ADL Overall ADL's : Needs assistance/impaired Eating/Feeding: Modified independent;Sitting   Grooming: Set up;Sitting   Upper Body Bathing: Set up;Sitting   Lower Body Bathing: Contact guard assist;Sit to/from stand   Upper Body Dressing : Set up;Sitting   Lower Body Dressing: Contact guard assist;Sit to/from stand   Toilet Transfer: Contact guard assist;Ambulation (cane)           Functional mobility  during ADLs: Contact guard assist;Cane       Vision Patient Visual Report: No change from baseline       Perception         Praxis         Pertinent Vitals/Pain Pain Assessment Pain Assessment: No/denies pain     Extremity/Trunk Assessment Upper Extremity Assessment Upper Extremity Assessment: Left hand dominant LUE Deficits / Details: Pt with  baseline LUE deficits due to prior stroke. LUE positioned in shoulder adduction, elbow flexion, wrist flexion and digit extension. Pt reports baseline decreased sensation in the LUE and only uses it to flip a light on by performing shoulder abduction and using elbow to maneuver light switch. Pt reports no pain throughout session and that arm currently at baseline LUE Sensation: decreased light touch   Lower Extremity Assessment Lower Extremity Assessment: Defer to PT evaluation LLE Deficits / Details: Decreased strength, AROM and coordination consistent with prior CVA.   Cervical / Trunk Assessment Cervical / Trunk Assessment: Normal   Communication Communication Communication: No apparent difficulties   Cognition Arousal: Alert Behavior During Therapy: WFL for tasks assessed/performed Cognition: No family/caregiver present to determine baseline             OT - Cognition Comments: suspect at baseline function                 Following commands: Intact       Cueing  General Comments   Cueing Techniques: Verbal cues;Gestural cues      Exercises     Shoulder Instructions      Home Living Family/patient expects to be discharged to:: Private residence Living Arrangements: Alone Available Help at Discharge: Personal care attendant (5 days a week M-F 3.5 hours/day) Type of Home: Apartment Home Access: Elevator     Home Layout: One level     Bathroom Shower/Tub: Chief Strategy Officer: Standard Bathroom Accessibility: Yes   Home Equipment: Grab bars - tub/shower;Grab bars - toilet;Cane - single point          Prior Functioning/Environment Prior Level of Function : Needs assist;Driving;History of Falls (last six months)             Mobility Comments: Ambulates with a cane, drives ADLs Comments: Microwave meals, otherwise does not cook. Goes out to shopping tuesdays, chess club on wednesdays, grocery store thursdays. Likes puzzles,  reading. Pt reports independent with bathing, dressing.    OT Problem List: Decreased strength;Decreased activity tolerance;Decreased range of motion;Impaired balance (sitting and/or standing);Decreased knowledge of use of DME or AE   OT Treatment/Interventions: Self-care/ADL training;Therapeutic exercise;DME and/or AE instruction;Therapeutic activities;Balance training;Patient/family education      OT Goals(Current goals can be found in the care plan section)   Acute Rehab OT Goals Patient Stated Goal: get better OT Goal Formulation: With patient Time For Goal Achievement: 04/29/24 Potential to Achieve Goals: Good ADL Goals Pt Will Perform Grooming: with supervision;standing Pt Will Perform Upper Body Dressing: with set-up;sitting Pt Will Transfer to Toilet: with supervision;ambulating   OT Frequency:  Min 1X/week    Co-evaluation PT/OT/SLP Co-Evaluation/Treatment: Yes Reason for Co-Treatment: Complexity of the patient's impairments (multi-system involvement);For patient/therapist safety;To address functional/ADL transfers PT goals addressed during session: Mobility/safety with mobility;Balance;Proper use of DME;Strengthening/ROM OT goals addressed during session: ADL's and self-care;Strengthening/ROM      AM-PAC OT 6 Clicks Daily Activity     Outcome Measure Help from another person eating meals?: None Help from another person taking care of personal grooming?:  A Little Help from another person toileting, which includes using toliet, bedpan, or urinal?: A Little Help from another person bathing (including washing, rinsing, drying)?: A Little Help from another person to put on and taking off regular upper body clothing?: A Little Help from another person to put on and taking off regular lower body clothing?: A Little 6 Click Score: 19   End of Session Equipment Utilized During Treatment: Gait belt (cane) Nurse Communication: Mobility status  Activity Tolerance: Patient  tolerated treatment well Patient left: in chair;with call bell/phone within reach;with chair alarm set  OT Visit Diagnosis: Unsteadiness on feet (R26.81);Muscle weakness (generalized) (M62.81)                Time: 8950-8879 OT Time Calculation (min): 31 min Charges:  OT General Charges $OT Visit: 1 Visit OT Evaluation $OT Eval Moderate Complexity: 1 Mod  Elma JONETTA Lebron FREDERICK, OTR/L [facility] Acute Rehabilitation Office: [phone]   Elma JONETTA Lebron 04/15/2024, 2:47 PM

## 2024-04-16 ENCOUNTER — Encounter (HOSPITAL_COMMUNITY): Payer: Self-pay | Admitting: Orthopedic Surgery

## 2024-04-16 ENCOUNTER — Other Ambulatory Visit (HOSPITAL_COMMUNITY): Payer: Self-pay

## 2024-04-16 LAB — VITAMIN D 25 HYDROXY (VIT D DEFICIENCY, FRACTURES): Vit D, 25-Hydroxy: 32.32 ng/mL (ref 30–100)

## 2024-04-16 MED ORDER — OXYCODONE HCL 5 MG PO TABS
5.0000 mg | ORAL_TABLET | Freq: Four times a day (QID) | ORAL | 0 refills | Status: AC | PRN
  Filled 2024-04-16: qty 20, 5d supply, fill #0

## 2024-04-16 MED ORDER — ACETAMINOPHEN 500 MG PO TABS
1000.0000 mg | ORAL_TABLET | Freq: Three times a day (TID) | ORAL | 0 refills | Status: AC | PRN
  Filled 2024-04-16: qty 30, 5d supply, fill #0

## 2024-04-16 NOTE — Progress Notes (Addendum)
 Orthopaedic Trauma Service Progress Note  Patient ID: Jason Myers MRN: [id number] DOB/AGE: November 28, 1950 73 y.o.  Subjective:  No complaints  Has arranged for a ride tomorrow am   No new issues    ROS As above  Today's  total administered Morphine  Milligram Equivalents: 0 Yesterday's total administered Morphine  Milligram Equivalents: 0  Objective:   VITALS:   Vitals:   04/15/24 1546 04/15/24 1909 04/16/24 0344 04/16/24 0739  BP: (!) 138/92 (!) 127/92 117/74 (!) 141/80  Pulse: 65 70 67 67  Resp: 17 18 18 16   Temp: 97.6 F (36.4 C) 98.7 F (37.1 C) 97.8 F (36.6 C) 98.2 F (36.8 C)  TempSrc:  Oral Oral   SpO2: 98% 100% 96% 95%  Weight:      Height:        Estimated body mass index is 23.62 kg/m as calculated from the following:   Height as of this encounter: 6' 0.99 (1.854 m).   Weight as of this encounter: 81.2 kg.   Intake/Output      09/08 0701 09/09 0700 09/09 0701 09/10 0700   P.O. 720    I.V. (mL/kg)     IV Piggyback     Total Intake(mL/kg) 720 (8.9)    Urine (mL/kg/hr) 800 (0.4) 1050 (1.7)   Emesis/NG output     Stool     Blood     Total Output 800 1050   Net -80 -1050          LABS  Results for orders placed or performed during the hospital encounter of 04/14/24 (from the past 24 hours)  VITAMIN D  25 Hydroxy (Vit-D Deficiency, Fractures)     Status: None   Collection Time: 04/16/24  4:51 AM  Result Value Ref Range   Vit D, 25-Hydroxy 32.32 30 - 100 ng/mL     PHYSICAL EXAM:   Gen: resting comfortably in bed, NAD, waiting for a sponge bath Lungs: unlabored Ext:       Left upper Extremity              Dressing L upper arm clean, dry and intact             Ext warm             + radial pulse             Motor and sensory functions at baseline              Chronic deformity/contracture present  Assessment/Plan: 2 Days Post-Op   Principal Problem:   Closed  fracture of left proximal humerus   Anti-infectives (From admission, onward)    Start     Dose/Rate Route Frequency Ordered Stop   04/14/24 1530  ceFAZolin  (ANCEF ) IVPB 2g/100 mL premix        2 g 200 mL/hr over 30 Minutes Intravenous Every 6 hours 04/14/24 1434 04/15/24 0047   04/14/24 0945  ceFAZolin  (ANCEF ) IVPB 2g/100 mL premix        2 g 200 mL/hr over 30 Minutes Intravenous  Once 04/14/24 0940 04/14/24 1120     .  POD/HD#: 42  73 year old right-hand-dominant male history of left hemiplegia with acute left proximal humerus fracture   - Fall   -Displaced left proximal humerus fracture s/p ORIF  No formal restrictions as his left arm is really nonfunctional at baseline               Mobilize as tolerated               Ice prn                Dressing changes as needed                Plan for dc tomorrow am    - Pain management:               Multimodal               No opioids today, again    - ABL anemia/Hemodynamics               Stable   - Medical issues                History of stroke with left-sided hemiplegia               BPH                - ID:                Perioperative antibiotics completed    - Metabolic Bone Disease:               Vitamin D  labs look good   - Activity:               As above   - FEN/GI prophylaxis/Foley/Lines:             Reg diet    - Dispo:               Continue therapies                Dc home tomorrow    Dc meds sent to [facility] pharmacy    Francis MICAEL Mt, PA-C [phone] (C) 04/16/2024, 2:35 PM  [facility] [address] [place] [address] [phone] (F)    After 5pm and on the weekends please log on to Amion, go to orthopaedics and the look under the [facility] Call for the provider(s) on call. You can also call our office at [phone] and then follow the prompts to be connected to the call team.  Patient ID: Jason Myers, male   DOB:  23-Feb-1951, 73 y.o.   MRN: [id number]

## 2024-04-16 NOTE — Discharge Summary (Signed)
 Orthopaedic Trauma Service (OTS) Discharge Summary   Patient ID: Jason Myers MRN: [id number] DOB/AGE: [birth year] 73 y.o.  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     Hypercholesterolemia    Pulmonary embolism (HCC)    2018, 2019, 2022   SBO (small bowel obstruction) (HCC) 04/2018     Procedures Performed: 04/14/2024-Dr. Celena  OPEN REDUCTION INTERNAL FIXATION OF LEFT PROXIMAL HUMERUS FRACTURE    Discharged Condition: good  Hospital Course:  73 year old male with history of CVA with left-sided deficit who sustained a fall at home on 04/14/2024.  He was brought into the emergency department and was found to have an isolated injury to his left upper extremity.  We did discuss potential nonoperative treatment given his limited functionality with his left arm however due to severe muscle atrophy and neurologic dysfunction he would likely go on to nonunion.  He was agreeable to proceed with ORIF.  I was done later in the morning on 04/14/2024.  Patient tolerated the procedure well.  After surgery he was transferred to the PACU for care from anesthesia and then transferred back up to the orthopedic floor for observation, pain control therapies.  Patient did not have any issues during his hospital stay and was ultimately discharged on 04/17/2024.  Patient discharged in stable condition.  Consults:  None  Significant Diagnostic Studies:   Latest Reference Range & Units 04/15/24 09:52 04/16/24 04:51  Vitamin D , 25-Hydroxy 30 - 100 ng/mL  32.32  WBC 4.0 - 10.5 K/uL 11.4 (H)   RBC 4.22 - 5.81 MIL/uL 4.76   Hemoglobin 13.0 - 17.0 g/dL 85.1   HCT 60.9 - 47.9 % 43.3   MCV 80.0 - 100.0 fL 91.0   MCH 26.0 - 34.0 pg 31.1   MCHC 30.0 - 36.0 g/dL 65.7   RDW 88.4 - 84.4 % 12.7   Platelets 150 - 400 K/uL 216   nRBC 0.0 - 0.2 % 0.0   (H): Data is abnormally high  Treatments: IV hydration, antibiotics: Ancef , analgesia: acetaminophen  and oxycodone , therapies: PT, OT, and RN, and surgery: as above  Discharge Exam:                    Orthopaedic Trauma Service Progress Note   Patient ID: Jason Myers MRN: [id number] DOB/AGE: [birth year] 73 y.o.   Subjective:   No complaints  Has arranged for a ride tomorrow am    No new issues      ROS As above   Today's  total administered Morphine  Milligram Equivalents: 0 Yesterday's total administered Morphine  Milligram Equivalents: 0   Objective:    BP 112/82 (BP Location: Right Arm)   Pulse 89   Temp 98 F (36.7 C)   Resp 16   Ht 6' 0.99 (1.854 m)   Wt 81.2 kg   SpO2 96%   BMI 23.62 kg/m   Estimated body mass index is 23.62 kg/m as calculated from the following:  Height as of this encounter: 6' 0.99 (1.854 m).   Weight as of this encounter: 81.2 kg.     Intake/Output      09/08 0701 09/09 0700 09/09 0701 09/10 0700   P.O. 720    I.V. (mL/kg)     IV Piggyback     Total Intake(mL/kg) 720 (8.9)    Urine (mL/kg/hr) 800 (0.4) 1050 (1.7)   Emesis/NG output     Stool     Blood     Total Output 800 1050   Net -80 -1050           LABS   Lab Results Last 24 Hours       Results for orders placed or performed during the hospital encounter of 04/14/24 (from the past 24 hours)  VITAMIN D  25 Hydroxy (Vit-D Deficiency, Fractures)     Status: None    Collection Time: 04/16/24  4:51 AM  Result Value Ref Range    Vit D,  25-Hydroxy 32.32 30 - 100 ng/mL          PHYSICAL EXAM:    Gen: resting comfortably in bed, NAD, waiting for a sponge bath Lungs: unlabored Ext:       Left upper Extremity              Dressing L upper arm clean, dry and intact             Ext warm             + radial pulse             Motor and sensory functions at baseline              Chronic deformity/contracture present   Assessment/Plan: 2 Days Post-Op    Principal Problem:   Closed fracture of left proximal humerus     Anti-infectives (From admission, onward)        Start     Dose/Rate Route Frequency Ordered Stop    04/14/24 1530   ceFAZolin  (ANCEF ) IVPB 2g/100 mL premix        2 g 200 mL/hr over 30 Minutes Intravenous Every 6 hours 04/14/24 1434 04/15/24 0047    04/14/24 0945   ceFAZolin  (ANCEF ) IVPB 2g/100 mL premix        2 g 200 mL/hr over 30 Minutes Intravenous  Once 04/14/24 0940 04/14/24 1120         .   POD/HD#: 38   73 year old right-hand-dominant male history of left hemiplegia with acute left proximal humerus fracture   - Fall   -Displaced left proximal humerus fracture s/p ORIF               No formal restrictions as his left arm is really nonfunctional at baseline               Mobilize as tolerated               Ice prn                Dressing changes as needed                Plan for dc tomorrow am    - Pain management:               Multimodal               No opioids today, again    - ABL anemia/Hemodynamics  Stable   - Medical issues                History of stroke with left-sided hemiplegia               BPH                - ID:                Perioperative antibiotics completed    - Metabolic Bone Disease:               Vitamin D  labs look good   - Activity:               As above   - FEN/GI prophylaxis/Foley/Lines:             Reg diet    - Dispo:               Continue therapies                Dc home tomorrow               Dc [facility] to [facility]  pharmacy   Disposition: Discharge disposition: 01-Home or Self Care       Discharge Instructions     Call MD / Call 911   Complete by: As directed    If you experience chest pain or shortness of breath, CALL 911 and be transported to the hospital emergency room.  If you develope a fever above 101 F, pus (white drainage) or increased drainage or redness at the wound, or calf pain, call your surgeon's office.   Constipation Prevention   Complete by: As directed    Drink plenty of fluids.  Prune juice may be helpful.  You may use a stool softener, such as Colace (over the counter) 100 mg twice a day.  Use MiraLax  (over the counter) for constipation as needed.   Diet general   Complete by: As directed    Discharge instructions   Complete by: As directed    Orthopaedic Trauma Service Discharge Instructions   General Discharge Instructions  Orthopaedic Injuries:  Left proximal humerus fracture treated with open reduction internal fixation with plate and screws  WEIGHT BEARING STATUS: nonweightbearing with left arm, no lifting with left arm   RANGE OF MOTION/ACTIVITY: no formal restrictions with left shoulder  Bone health: Vitamin D  levels look okay  Review the following resource for additional information regarding bone health  [email or web address].com.cy  Wound Care: Daily dressing changes as needed starting upon return home.  See below Discharge Wound Care Instructions  Do NOT apply any ointments, solutions or lotions to pin sites or surgical wounds.  These prevent needed drainage and even though solutions like hydrogen peroxide kill bacteria, they also damage cells lining the pin sites that help fight infection.  Applying lotions or ointments can keep the wounds moist and can cause them to breakdown and open up as well. This can increase the risk for infection. When in doubt call the office.  Surgical incisions should be dressed daily.  If any drainage is  noted, use one layer of adaptic or Mepitel, then gauze, and tape.  Alternatively you can use a silicone foam dressing such as a Mepilex which is what you currently have on.  NetCamper.cz https://dennis-soto.com/?pd_rd_i=[email or web address]&th=1  http://rojas.com/  These dressing supplies should be available at local medical supply stores ([employer], [facility], etc). They are not usually carried at places like  [facility], [facility], [employer], etc  Once the incision is completely dry and without drainage, it may be left open to air out.  Showering may begin 36-48 hours later.  Cleaning gently with soap and water .  Diet: as you were eating previously.  Can use over the counter stool softeners and bowel preparations, such as Miralax , to help with bowel movements.  Narcotics can be constipating.  Be sure to drink plenty of fluids  PAIN MEDICATION USE AND EXPECTATIONS  You have likely been given narcotic medications to help control your pain.  After a traumatic event that results in an fracture (broken bone) with or without surgery, it is ok to use narcotic pain medications to help control one's pain.  We understand that everyone responds to pain differently and each individual patient will be evaluated on a regular basis for the continued need for narcotic medications. Ideally, narcotic medication use should last no more than 6-8 weeks (coinciding with fracture healing).   As a patient it is your responsibility as well to monitor narcotic medication use and report the amount and frequency you use these medications when you come to your office visit.   We would also advise that if you are using narcotic medications, you should take a dose prior to therapy to maximize you participation.  IF YOU ARE ON NARCOTIC  MEDICATIONS IT IS NOT PERMISSIBLE TO OPERATE A MOTOR VEHICLE (MOTORCYCLE/CAR/TRUCK/MOPED) OR HEAVY MACHINERY DO NOT MIX NARCOTICS WITH OTHER CNS (CENTRAL NERVOUS SYSTEM) DEPRESSANTS SUCH AS ALCOHOL   POST-OPERATIVE OPIOID TAPER INSTRUCTIONS:  It is important to wean off of your opioid medication as soon as possible. If you do not need pain medication after your surgery it is ok to stop day one.  Opioids include:  o Codeine, Hydrocodone (Norco, Vicodin), Oxycodone (Percocet, oxycontin ) and hydromorphone  amongst others.   Long term and even short term use of opiods can cause:  o Increased pain response  o Dependence  o Constipation  o Depression  o Respiratory depression  o And more.   Withdrawal symptoms can include  o Flu like symptoms  o Nausea, vomiting  o And more  Techniques to manage these symptoms  o Hydrate well  o Eat regular healthy meals  o Stay active  o Use relaxation techniques(deep breathing, meditating, yoga)  Do Not substitute Alcohol to help with tapering  If you have been on opioids for less than two weeks and do not have pain than it is ok to stop all together.   Plan to wean off of opioids  o This plan should start within one week post op of your fracture surgery   o Maintain the same interval or time between taking each dose and first decrease the dose.   o Cut the total daily intake of opioids by one tablet each day  o Next start to increase the time between doses.  o The last dose that should be eliminated is the evening dose.    STOP SMOKING OR USING NICOTINE PRODUCTS!!!!  As discussed nicotine severely impairs your body's ability to heal surgical and traumatic wounds but also impairs bone healing.  Wounds and bone heal by forming microscopic blood vessels (angiogenesis) and nicotine is a vasoconstrictor (essentially, shrinks blood vessels).  Therefore, if vasoconstriction occurs to these microscopic blood vessels they essentially disappear and are  unable to deliver necessary nutrients to the healing tissue.  This is one modifiable factor that you can do to dramatically increase your chances of healing your injury.    (  This means no smoking, no nicotine gum, patches, etc)  DO NOT USE NONSTEROIDAL ANTI-INFLAMMATORY DRUGS (NSAID'S)  Using products such as Advil (ibuprofen), Aleve (naproxen), Motrin (ibuprofen) for additional pain control during fracture healing can delay and/or prevent the healing response.  If you would like to take over the counter (OTC) medication, Tylenol  (acetaminophen ) is ok.  However, some narcotic medications that are given for pain control contain acetaminophen  as well. Therefore, you should not exceed more than 4000 mg of tylenol  in a day if you do not have liver disease.  Also note that there are may OTC medicines, such as cold medicines and allergy medicines that my contain tylenol  as well.  If you have any questions about medications and/or interactions please ask your doctor/PA or your pharmacist.      ICE AND ELEVATE INJURED/OPERATIVE EXTREMITY  Using ice and elevating the injured extremity above your heart can help with swelling and pain control.  Icing in a pulsatile fashion, such as 20 minutes on and 20 minutes off, can be followed.    Do not place ice directly on skin. Make sure there is a barrier between to skin and the ice pack.    Using frozen items such as frozen peas works well as the conform nicely to the are that needs to be iced.  USE AN ACE WRAP OR TED HOSE FOR SWELLING CONTROL  In addition to icing and elevation, Ace wraps or TED hose are used to help limit and resolve swelling.  It is recommended to use Ace wraps or TED hose until you are informed to stop.    When using Ace Wraps start the wrapping distally (farthest away from the body) and wrap proximally (closer to the body)   Example: If you had surgery on your leg and you do not have a splint on, start the ace wrap at the toes and work your way up  to the thigh        If you had surgery on your upper extremity and do not have a splint on, start the ace wrap at your fingers and work your way up to the upper arm  IF YOU ARE IN A SPLINT OR CAST DO NOT REMOVE IT FOR ANY REASON   If your splint gets wet for any reason please contact the office immediately. You may shower in your splint or cast as long as you keep it dry.  This can be done by wrapping in a cast cover or garbage back (or similar)  Do Not stick any thing down your splint or cast such as pencils, money, or hangers to try and scratch yourself with.  If you feel itchy take benadryl as prescribed on the bottle for itching  IF YOU ARE IN A CAM BOOT (BLACK BOOT)  You may remove boot periodically. Perform daily dressing changes as noted below.  Wash the liner of the boot regularly and wear a sock when wearing the boot. It is recommended that you sleep in the boot until told otherwise    Call office for the following: ? Temperature greater than 101F ? Persistent nausea and vomiting ? Severe uncontrolled pain ? Redness, tenderness, or signs of infection (pain, swelling, redness, odor or green/yellow discharge around the site) ? Difficulty breathing, headache or visual disturbances ? Hives ? Persistent dizziness or light-headedness ? Extreme fatigue ? Any other questions or concerns you may have after discharge  In an emergency, call 911 or go to an Emergency Department at a  nearby hospital  HELPFUL INFORMATION  ? If you had a block, it will wear off between 8-24 hrs postop typically.  This is period when your pain may go from nearly zero to the pain you would have had postop without the block.  This is an abrupt transition but nothing dangerous is happening.  You may take an extra dose of narcotic when this happens.  ? You should wean off your narcotic medicines as soon as you are able.  Most patients will be off or using minimal narcotics before their first postop appointment.    ? We suggest you use the pain medication the first night prior to going to bed, in order to ease any pain when the anesthesia wears off. You should avoid taking pain medications on an empty stomach as it will make you nauseous.  ? Do not drink alcoholic beverages or take illicit drugs when taking pain medications.  ? In most states it is against the law to drive while you are in a splint or sling.  And certainly against the law to drive while taking narcotics.  ? You may return to work/school in the next couple of days when you feel up to it.   ? Pain medication may make you constipated.  Below are a few solutions to try in this order:   ? Decrease the amount of pain medication if you aren't having pain.   ? Drink lots of decaffeinated fluids.   ? Drink prune juice and/or each dried prunes   o If the first 3 don't work start with additional solutions   ? Take Colace - an over-the-counter stool softener   ? Take Senokot - an over-the-counter laxative   ? Take Miralax  - a stronger over-the-counter laxative     CALL THE OFFICE WITH ANY QUESTIONS OR CONCERNS: [phone]   VISIT OUR WEBSITE FOR ADDITIONAL INFORMATION: [email or web address]   Discharge wound care:   Complete by: As directed    Dressing changes as needed  Can clean with soap and water  only Ok to leave open to the air if there is no drainage   Increase activity slowly as tolerated   Complete by: As directed    Lifting restrictions   Complete by: As directed    No lifting with left arm   Non weight bearing   Complete by: As directed    Laterality: left   Extremity: Upper   Post-operative opioid taper instructions:   Complete by: As directed    POST-OPERATIVE OPIOID TAPER INSTRUCTIONS: It is important to wean off of your opioid medication as soon as possible. If you do not need pain medication after your surgery it is ok to stop day one. Opioids include: Codeine, Hydrocodone (Norco, Vicodin), Oxycodone (Percocet,  oxycontin ) and hydromorphone  amongst others.  Long term and even short term use of opiods can cause: Increased pain response Dependence Constipation Depression Respiratory depression And more.  Withdrawal symptoms can include Flu like symptoms Nausea, vomiting And more Techniques to manage these symptoms Hydrate well Eat regular healthy meals Stay active Use relaxation techniques(deep breathing, meditating, yoga) Do Not substitute Alcohol to help with tapering If you have been on opioids for less than two weeks and do not have pain than it is ok to stop all together.  Plan to wean off of opioids This plan should start within one week post op of your joint replacement. Maintain the same interval or time between taking each dose and first decrease the dose.  Cut the total  daily intake of opioids by one tablet each day Next start to increase the time between doses. The last dose that should be eliminated is the evening dose.         Allergies as of 04/17/2024   No Known Allergies      Medication List     TAKE these medications    Acetaminophen  Extra Strength 500 MG Tabs Take 2 tablets (1,000 mg total) by mouth every 8 (eight) hours as needed.   naproxen sodium 220 MG tablet Commonly known as: ALEVE Take 440 mg by mouth 2 (two) times daily as needed (back pain).   oxyCODONE  5 MG immediate release tablet Commonly known as: Oxy IR/ROXICODONE  Take 1 tablet (5 mg total) by mouth every 6 (six) hours as needed for severe pain (pain score 7-10).   rosuvastatin  10 MG tablet Commonly known as: CRESTOR  Take 10 mg by mouth daily.   tamsulosin  0.4 MG Caps capsule Commonly known as: FLOMAX  Take 0.4 mg by mouth at bedtime.       ASK your doctor about these medications    apixaban  5 MG Tabs tablet Commonly known as: ELIQUIS  Take 5 mg by mouth 2 (two) times daily. Ask about: Which instructions should I use?               Discharge Care Instructions  (From  admission, onward)           Start     Ordered   04/16/24 0000  Discharge wound care:       Comments: Dressing changes as needed  Can clean with soap and water  only Ok to leave open to the air if there is no drainage   04/16/24 1445   04/16/24 0000  Non weight bearing       Question Answer Comment  Laterality left   Extremity Upper      04/16/24 1448            Follow-up Information     [facility] Follow up.   Specialty: Internal Medicine Contact information: [address] [place] [address] [phone]         Celena Sharper, MD. Schedule an appointment as soon as possible for a visit in 2 week(s).   Specialty: Orthopedic Surgery Contact information: [address] [place] [address] [phone]                 Discharge Instructions and Plan:  73 year old right-hand-dominant male history of left hemiplegia with acute left proximal humerus fracture   - Fall   -Displaced left proximal humerus fracture s/p ORIF               No formal restrictions as his left arm is really nonfunctional at baseline               Mobilize as tolerated               Ice prn                Dressing changes as needed                 - Pain management:               Multimodal               No opioids today, again   Will discharge with small number of oxycodone  for severe breakthrough pain   - ABL anemia/Hemodynamics  Stable   - Medical issues                History of stroke with left-sided hemiplegia               BPH                - ID:                Perioperative antibiotics completed    - Metabolic Bone Disease:               Vitamin D  labs look good   - Activity:               As above   - FEN/GI prophylaxis/Foley/Lines:             Reg diet    - Dispo:               Discharge home today, follow-up with orthopedics in 2 weeks for suture removal and follow-up x-rays  Signed:  Francis MICAEL Mt,  PA-C [phone] (C) 05/09/2024, 12:14 PM  [facility] [address] [place] [address] [phone] MAXIMINO MILLING (F)

## 2024-04-16 NOTE — Plan of Care (Signed)
   Problem: Clinical Measurements: Goal: Will remain free from infection Outcome: Progressing   Problem: Pain Managment: Goal: General experience of comfort will improve and/or be controlled Outcome: Progressing   Problem: Safety: Goal: Ability to remain free from injury will improve Outcome: Progressing

## 2024-04-16 NOTE — Discharge Instructions (Signed)
 [facility] Discharge Instructions   General Discharge Instructions  Orthopaedic Injuries:  Left proximal humerus fracture treated with open reduction internal fixation with plate and screws  WEIGHT BEARING STATUS: nonweightbearing with left arm, no lifting with left arm   RANGE OF MOTION/ACTIVITY: no formal restrictions with left shoulder  Bone health: Vitamin D  levels look okay  Review the following resource for additional information regarding bone health  [email or web address]  Wound Care: Daily dressing changes as needed starting upon return home.  See below Discharge Wound Care Instructions  Do NOT apply any ointments, solutions or lotions to pin sites or surgical wounds.  These prevent needed drainage and even though solutions like hydrogen peroxide kill bacteria, they also damage cells lining the pin sites that help fight infection.  Applying lotions or ointments can keep the wounds moist and can cause them to breakdown and open up as well. This can increase the risk for infection. When in doubt call the office.  Surgical incisions should be dressed daily.  If any drainage is noted, use one layer of adaptic or Mepitel, then gauze, and tape.  Alternatively you can use a silicone foam dressing such as a Mepilex which is what you currently have on.  [email or web address]  [email or web address]  These dressing supplies should be available at local medical supply stores ([facility], [facility], etc). They are not usually carried at places like [facility], [facility], [facility], etc  Once the incision is completely dry and without drainage, it may be left open to air out.  Showering may begin 36-48 hours later.   Cleaning gently with soap and water .  Diet: as you were eating previously.  Can use over the counter stool softeners and bowel preparations, such as Miralax , to help with bowel movements.  Narcotics can be constipating.  Be sure to drink plenty of fluids  PAIN MEDICATION USE AND EXPECTATIONS  You have likely been given narcotic medications to help control your pain.  After a traumatic event that results in an fracture (broken bone) with or without surgery, it is ok to use narcotic pain medications to help control one's pain.  We understand that everyone responds to pain differently and each individual patient will be evaluated on a regular basis for the continued need for narcotic medications. Ideally, narcotic medication use should last no more than 6-8 weeks (coinciding with fracture healing).   As a patient it is your responsibility as well to monitor narcotic medication use and report the amount and frequency you use these medications when you come to your office visit.   We would also advise that if you are using narcotic medications, you should take a dose prior to therapy to maximize you participation.  IF YOU ARE ON NARCOTIC MEDICATIONS IT IS NOT PERMISSIBLE TO OPERATE A MOTOR VEHICLE (MOTORCYCLE/CAR/TRUCK/MOPED) OR HEAVY MACHINERY DO NOT MIX NARCOTICS WITH OTHER CNS (CENTRAL NERVOUS SYSTEM) DEPRESSANTS SUCH AS ALCOHOL   POST-OPERATIVE OPIOID TAPER INSTRUCTIONS: It is important to wean off of your opioid medication as soon as possible. If you do not need pain medication after your surgery it is ok to stop day one. Opioids include: Codeine, Hydrocodone (Norco, Vicodin), Oxycodone (Percocet, oxycontin ) and hydromorphone  amongst others.  Long term and even short term use of opiods can cause: Increased pain response Dependence Constipation Depression Respiratory depression And more.  Withdrawal symptoms can include Flu like symptoms Nausea, vomiting And more Techniques to manage these  symptoms Hydrate well Eat regular healthy meals Stay active Use relaxation techniques(deep breathing,  meditating, yoga) Do Not substitute Alcohol to help with tapering If you have been on opioids for less than two weeks and do not have pain than it is ok to stop all together.  Plan to wean off of opioids This plan should start within one week post op of your fracture surgery  Maintain the same interval or time between taking each dose and first decrease the dose.  Cut the total daily intake of opioids by one tablet each day Next start to increase the time between doses. The last dose that should be eliminated is the evening dose.    STOP SMOKING OR USING NICOTINE PRODUCTS!!!!  As discussed nicotine severely impairs your body's ability to heal surgical and traumatic wounds but also impairs bone healing.  Wounds and bone heal by forming microscopic blood vessels (angiogenesis) and nicotine is a vasoconstrictor (essentially, shrinks blood vessels).  Therefore, if vasoconstriction occurs to these microscopic blood vessels they essentially disappear and are unable to deliver necessary nutrients to the healing tissue.  This is one modifiable factor that you can do to dramatically increase your chances of healing your injury.    (This means no smoking, no nicotine gum, patches, etc)  DO NOT USE NONSTEROIDAL ANTI-INFLAMMATORY DRUGS (NSAID'S)  Using products such as Advil (ibuprofen), Aleve (naproxen), Motrin (ibuprofen) for additional pain control during fracture healing can delay and/or prevent the healing response.  If you would like to take over the counter (OTC) medication, Tylenol  (acetaminophen ) is ok.  However, some narcotic medications that are given for pain control contain acetaminophen  as well. Therefore, you should not exceed more than 4000 mg of tylenol  in a day if you do not have liver disease.  Also note that there are may OTC medicines, such as cold medicines and allergy medicines that my  contain tylenol  as well.  If you have any questions about medications and/or interactions please ask your doctor/PA or your pharmacist.      ICE AND ELEVATE INJURED/OPERATIVE EXTREMITY  Using ice and elevating the injured extremity above your heart can help with swelling and pain control.  Icing in a pulsatile fashion, such as 20 minutes on and 20 minutes off, can be followed.    Do not place ice directly on skin. Make sure there is a barrier between to skin and the ice pack.    Using frozen items such as frozen peas works well as the conform nicely to the are that needs to be iced.  USE AN ACE WRAP OR TED HOSE FOR SWELLING CONTROL  In addition to icing and elevation, Ace wraps or TED hose are used to help limit and resolve swelling.  It is recommended to use Ace wraps or TED hose until you are informed to stop.    When using Ace Wraps start the wrapping distally (farthest away from the body) and wrap proximally (closer to the body)   Example: If you had surgery on your leg and you do not have a splint on, start the ace wrap at the toes and work your way up to the thigh        If you had surgery on your upper extremity and do not have a splint on, start the ace wrap at your fingers and work your way up to the upper arm  IF YOU ARE IN A SPLINT OR CAST DO NOT REMOVE IT FOR ANY REASON   If your splint gets wet for any reason please contact the office immediately. You may shower  in your splint or cast as long as you keep it dry.  This can be done by wrapping in a cast cover or garbage back (or similar)  Do Not stick any thing down your splint or cast such as pencils, money, or hangers to try and scratch yourself with.  If you feel itchy take benadryl as prescribed on the bottle for itching  IF YOU ARE IN A CAM BOOT (BLACK BOOT)  You may remove boot periodically. Perform daily dressing changes as noted below.  Wash the liner of the boot regularly and wear a sock when wearing the boot. It is recommended  that you sleep in the boot until told otherwise    Call office for the following: Temperature greater than 101F Persistent nausea and vomiting Severe uncontrolled pain Redness, tenderness, or signs of infection (pain, swelling, redness, odor or green/yellow discharge around the site) Difficulty breathing, headache or visual disturbances Hives Persistent dizziness or light-headedness Extreme fatigue Any other questions or concerns you may have after discharge  In an emergency, call 911 or go to an Emergency Department at a nearby hospital  HELPFUL INFORMATION  If you had a block, it will wear off between 8-24 hrs postop typically.  This is period when your pain may go from nearly zero to the pain you would have had postop without the block.  This is an abrupt transition but nothing dangerous is happening.  You may take an extra dose of narcotic when this happens.  You should wean off your narcotic medicines as soon as you are able.  Most patients will be off or using minimal narcotics before their first postop appointment.   We suggest you use the pain medication the first night prior to going to bed, in order to ease any pain when the anesthesia wears off. You should avoid taking pain medications on an empty stomach as it will make you nauseous.  Do not drink alcoholic beverages or take illicit drugs when taking pain medications.  In most states it is against the law to drive while you are in a splint or sling.  And certainly against the law to drive while taking narcotics.  You may return to work/school in the next couple of days when you feel up to it.   Pain medication may make you constipated.  Below are a few solutions to try in this order: Decrease the amount of pain medication if you aren't having pain. Drink lots of decaffeinated fluids. Drink prune juice and/or each dried prunes  If the first 3 don't work start with additional solutions Take Colace - an over-the-counter  stool softener Take Senokot - an over-the-counter laxative Take Miralax  - a stronger over-the-counter laxative     CALL THE OFFICE WITH ANY QUESTIONS OR CONCERNS: [phone]   VISIT OUR WEBSITE FOR ADDITIONAL INFORMATION: [email or web address]

## 2024-04-17 ENCOUNTER — Other Ambulatory Visit (HOSPITAL_COMMUNITY): Payer: Self-pay

## 2024-04-17 NOTE — Plan of Care (Signed)
   Problem: Clinical Measurements: Goal: Will remain free from infection Outcome: Progressing   Problem: Pain Managment: Goal: General experience of comfort will improve and/or be controlled Outcome: Progressing   Problem: Safety: Goal: Ability to remain free from injury will improve Outcome: Progressing
